# Patient Record
Sex: Female | Born: 1935 | Race: White | Hispanic: No | Smoking: Former smoker
Health system: Southern US, Community
[De-identification: ages and names within clinical notes are randomized; demographics above are authoritative.]

## PROBLEM LIST (undated history)

## (undated) DIAGNOSIS — R0602 Shortness of breath: Secondary | ICD-10-CM

## (undated) DIAGNOSIS — E039 Hypothyroidism, unspecified: Secondary | ICD-10-CM

## (undated) DIAGNOSIS — I10 Essential (primary) hypertension: Secondary | ICD-10-CM

## (undated) DIAGNOSIS — C801 Malignant (primary) neoplasm, unspecified: Secondary | ICD-10-CM

## (undated) DIAGNOSIS — M5416 Radiculopathy, lumbar region: Secondary | ICD-10-CM

## (undated) DIAGNOSIS — Z87440 Personal history of urinary (tract) infections: Secondary | ICD-10-CM

## (undated) DIAGNOSIS — J189 Pneumonia, unspecified organism: Secondary | ICD-10-CM

## (undated) DIAGNOSIS — K219 Gastro-esophageal reflux disease without esophagitis: Secondary | ICD-10-CM

## (undated) DIAGNOSIS — G729 Myopathy, unspecified: Secondary | ICD-10-CM

## (undated) DIAGNOSIS — J449 Chronic obstructive pulmonary disease, unspecified: Secondary | ICD-10-CM

## (undated) DIAGNOSIS — R011 Cardiac murmur, unspecified: Secondary | ICD-10-CM

## (undated) DIAGNOSIS — M1990 Unspecified osteoarthritis, unspecified site: Secondary | ICD-10-CM

## (undated) DIAGNOSIS — F419 Anxiety disorder, unspecified: Secondary | ICD-10-CM

## (undated) DIAGNOSIS — E785 Hyperlipidemia, unspecified: Secondary | ICD-10-CM

## (undated) HISTORY — DX: Hypothyroidism, unspecified: E03.9

## (undated) HISTORY — DX: Chronic obstructive pulmonary disease, unspecified: J44.9

## (undated) HISTORY — DX: Hyperlipidemia, unspecified: E78.5

## (undated) HISTORY — PX: DILATION AND CURETTAGE OF UTERUS: SHX78

## (undated) HISTORY — DX: Myopathy, unspecified: G72.9

## (undated) HISTORY — DX: Essential (primary) hypertension: I10

## (undated) HISTORY — DX: Radiculopathy, lumbar region: M54.16

## (undated) HISTORY — PX: GLAUCOMA SURGERY: SHX656

## (undated) HISTORY — DX: Unspecified osteoarthritis, unspecified site: M19.90

## (undated) HISTORY — PX: CATARACT EXTRACTION, BILATERAL: SHX1313

## (undated) HISTORY — PX: OTHER SURGICAL HISTORY: SHX169

---

## 1997-06-10 ENCOUNTER — Other Ambulatory Visit: Admission: RE | Admit: 1997-06-10 | Discharge: 1997-06-10 | Payer: Self-pay | Admitting: Gynecology

## 1997-06-24 ENCOUNTER — Other Ambulatory Visit: Admission: RE | Admit: 1997-06-24 | Discharge: 1997-06-24 | Payer: Self-pay | Admitting: Gynecology

## 1998-05-26 ENCOUNTER — Ambulatory Visit (HOSPITAL_COMMUNITY): Admission: RE | Admit: 1998-05-26 | Discharge: 1998-05-26 | Payer: Self-pay | Admitting: Internal Medicine

## 1998-05-26 ENCOUNTER — Encounter: Payer: Self-pay | Admitting: Internal Medicine

## 1998-05-28 ENCOUNTER — Ambulatory Visit: Admission: RE | Admit: 1998-05-28 | Discharge: 1998-05-28 | Payer: Self-pay | Admitting: Internal Medicine

## 1998-09-15 ENCOUNTER — Other Ambulatory Visit: Admission: RE | Admit: 1998-09-15 | Discharge: 1998-09-15 | Payer: Self-pay | Admitting: Gynecology

## 1999-04-20 ENCOUNTER — Encounter: Payer: Self-pay | Admitting: Rheumatology

## 1999-04-20 ENCOUNTER — Encounter: Admission: RE | Admit: 1999-04-20 | Discharge: 1999-04-20 | Payer: Self-pay | Admitting: Rheumatology

## 1999-05-18 ENCOUNTER — Encounter: Payer: Self-pay | Admitting: Rheumatology

## 1999-05-18 ENCOUNTER — Ambulatory Visit (HOSPITAL_COMMUNITY): Admission: RE | Admit: 1999-05-18 | Discharge: 1999-05-18 | Payer: Self-pay | Admitting: Rheumatology

## 1999-06-05 ENCOUNTER — Emergency Department (HOSPITAL_COMMUNITY): Admission: EM | Admit: 1999-06-05 | Discharge: 1999-06-05 | Payer: Self-pay | Admitting: Emergency Medicine

## 1999-06-06 ENCOUNTER — Encounter: Payer: Self-pay | Admitting: Emergency Medicine

## 1999-06-29 ENCOUNTER — Encounter: Admission: RE | Admit: 1999-06-29 | Discharge: 1999-06-29 | Payer: Self-pay | Admitting: Internal Medicine

## 1999-06-29 ENCOUNTER — Encounter: Payer: Self-pay | Admitting: Internal Medicine

## 1999-07-13 ENCOUNTER — Encounter: Admission: RE | Admit: 1999-07-13 | Discharge: 1999-08-03 | Payer: Self-pay | Admitting: Rheumatology

## 1999-11-02 ENCOUNTER — Other Ambulatory Visit: Admission: RE | Admit: 1999-11-02 | Discharge: 1999-11-02 | Payer: Self-pay | Admitting: Gynecology

## 2000-01-19 ENCOUNTER — Emergency Department (HOSPITAL_COMMUNITY): Admission: EM | Admit: 2000-01-19 | Discharge: 2000-01-19 | Payer: Self-pay | Admitting: Emergency Medicine

## 2000-01-19 ENCOUNTER — Encounter: Payer: Self-pay | Admitting: Emergency Medicine

## 2000-10-28 ENCOUNTER — Encounter: Admission: RE | Admit: 2000-10-28 | Discharge: 2000-10-28 | Payer: Self-pay | Admitting: Internal Medicine

## 2000-10-28 ENCOUNTER — Encounter: Payer: Self-pay | Admitting: Internal Medicine

## 2000-12-15 ENCOUNTER — Other Ambulatory Visit: Admission: RE | Admit: 2000-12-15 | Discharge: 2000-12-15 | Payer: Self-pay | Admitting: Gynecology

## 2001-04-26 ENCOUNTER — Encounter: Payer: Self-pay | Admitting: Emergency Medicine

## 2001-04-26 ENCOUNTER — Inpatient Hospital Stay (HOSPITAL_COMMUNITY): Admission: EM | Admit: 2001-04-26 | Discharge: 2001-04-27 | Payer: Self-pay | Admitting: Emergency Medicine

## 2002-11-19 ENCOUNTER — Encounter: Admission: RE | Admit: 2002-11-19 | Discharge: 2002-11-19 | Payer: Self-pay | Admitting: Internal Medicine

## 2002-11-19 ENCOUNTER — Encounter: Payer: Self-pay | Admitting: Internal Medicine

## 2003-01-15 ENCOUNTER — Ambulatory Visit: Admission: RE | Admit: 2003-01-15 | Discharge: 2003-01-15 | Payer: Self-pay | Admitting: Internal Medicine

## 2003-02-07 ENCOUNTER — Encounter: Admission: RE | Admit: 2003-02-07 | Discharge: 2003-02-07 | Payer: Self-pay | Admitting: Internal Medicine

## 2003-02-24 ENCOUNTER — Encounter: Admission: RE | Admit: 2003-02-24 | Discharge: 2003-02-24 | Payer: Self-pay | Admitting: Internal Medicine

## 2003-02-28 ENCOUNTER — Encounter: Admission: RE | Admit: 2003-02-28 | Discharge: 2003-02-28 | Payer: Self-pay | Admitting: Gynecology

## 2003-03-20 ENCOUNTER — Emergency Department (HOSPITAL_COMMUNITY): Admission: AD | Admit: 2003-03-20 | Discharge: 2003-03-20 | Payer: Self-pay | Admitting: Family Medicine

## 2003-03-27 ENCOUNTER — Encounter: Admission: RE | Admit: 2003-03-27 | Discharge: 2003-03-27 | Payer: Self-pay | Admitting: Internal Medicine

## 2003-06-27 ENCOUNTER — Encounter: Admission: RE | Admit: 2003-06-27 | Discharge: 2003-06-27 | Payer: Self-pay | Admitting: Internal Medicine

## 2003-07-03 ENCOUNTER — Other Ambulatory Visit: Admission: RE | Admit: 2003-07-03 | Discharge: 2003-07-03 | Payer: Self-pay | Admitting: Gynecology

## 2003-07-15 ENCOUNTER — Encounter: Admission: RE | Admit: 2003-07-15 | Discharge: 2003-07-15 | Payer: Self-pay | Admitting: Internal Medicine

## 2004-01-09 ENCOUNTER — Ambulatory Visit: Payer: Self-pay | Admitting: Internal Medicine

## 2004-02-07 ENCOUNTER — Ambulatory Visit: Payer: Self-pay | Admitting: Internal Medicine

## 2004-05-26 ENCOUNTER — Ambulatory Visit: Payer: Self-pay | Admitting: Internal Medicine

## 2004-06-09 ENCOUNTER — Encounter: Admission: RE | Admit: 2004-06-09 | Discharge: 2004-06-09 | Payer: Self-pay | Admitting: Internal Medicine

## 2004-06-22 ENCOUNTER — Ambulatory Visit: Payer: Self-pay | Admitting: Internal Medicine

## 2004-09-22 ENCOUNTER — Ambulatory Visit: Payer: Self-pay | Admitting: Internal Medicine

## 2004-10-27 ENCOUNTER — Encounter: Admission: RE | Admit: 2004-10-27 | Discharge: 2004-10-27 | Payer: Self-pay | Admitting: Internal Medicine

## 2004-11-30 ENCOUNTER — Ambulatory Visit: Payer: Self-pay | Admitting: Internal Medicine

## 2005-01-06 ENCOUNTER — Ambulatory Visit: Payer: Self-pay | Admitting: Internal Medicine

## 2005-04-09 ENCOUNTER — Ambulatory Visit: Payer: Self-pay | Admitting: Internal Medicine

## 2005-05-04 ENCOUNTER — Ambulatory Visit: Payer: Self-pay | Admitting: Gastroenterology

## 2005-05-10 ENCOUNTER — Ambulatory Visit: Payer: Self-pay | Admitting: Gastroenterology

## 2005-05-14 ENCOUNTER — Ambulatory Visit: Payer: Self-pay | Admitting: Gastroenterology

## 2005-05-14 ENCOUNTER — Encounter (INDEPENDENT_AMBULATORY_CARE_PROVIDER_SITE_OTHER): Payer: Self-pay | Admitting: Specialist

## 2005-08-02 ENCOUNTER — Encounter: Admission: RE | Admit: 2005-08-02 | Discharge: 2005-08-02 | Payer: Self-pay | Admitting: Internal Medicine

## 2005-11-11 ENCOUNTER — Encounter: Admission: RE | Admit: 2005-11-11 | Discharge: 2005-11-11 | Payer: Self-pay | Admitting: Internal Medicine

## 2005-11-15 ENCOUNTER — Other Ambulatory Visit: Admission: RE | Admit: 2005-11-15 | Discharge: 2005-11-15 | Payer: Self-pay | Admitting: Gynecology

## 2005-12-08 ENCOUNTER — Ambulatory Visit: Payer: Self-pay | Admitting: Internal Medicine

## 2005-12-13 ENCOUNTER — Ambulatory Visit: Payer: Self-pay | Admitting: Internal Medicine

## 2005-12-13 LAB — CONVERTED CEMR LAB
ALT: 20 units/L (ref 0–40)
AST: 31 units/L (ref 0–37)
BUN: 20 mg/dL (ref 6–23)
Chol/HDL Ratio, serum: 3.2
Cholesterol: 124 mg/dL (ref 0–200)
Creatinine, Ser: 1.2 mg/dL (ref 0.4–1.2)
HDL: 38.6 mg/dL — ABNORMAL LOW (ref >39.0)
LDL Cholesterol: 73 mg/dL (ref 0–99)
Potassium: 3.8 meq/L (ref 3.5–5.1)
Triglyceride fasting, serum: 63 mg/dL (ref 0–149)
VLDL: 13 mg/dL (ref 0–40)

## 2006-10-17 ENCOUNTER — Ambulatory Visit: Payer: Self-pay | Admitting: Internal Medicine

## 2006-10-24 LAB — CONVERTED CEMR LAB
ALT: 24 units/L (ref 0–35)
AST: 34 units/L (ref 0–37)
Cholesterol: 130 mg/dL (ref 0–200)
Glucose, Bld: 104 mg/dL — ABNORMAL HIGH (ref 70–99)
HDL: 37.7 mg/dL — ABNORMAL LOW (ref >39.0)
LDL Cholesterol: 77 mg/dL (ref 0–99)
Total CHOL/HDL Ratio: 3.4
Triglycerides: 76 mg/dL (ref 0–149)
VLDL: 15 mg/dL (ref 0–40)

## 2006-10-25 ENCOUNTER — Ambulatory Visit: Payer: Self-pay | Admitting: Internal Medicine

## 2006-10-25 DIAGNOSIS — IMO0002 Reserved for concepts with insufficient information to code with codable children: Secondary | ICD-10-CM

## 2006-10-25 DIAGNOSIS — R7989 Other specified abnormal findings of blood chemistry: Secondary | ICD-10-CM | POA: Insufficient documentation

## 2006-10-25 DIAGNOSIS — G729 Myopathy, unspecified: Secondary | ICD-10-CM

## 2006-10-25 DIAGNOSIS — E785 Hyperlipidemia, unspecified: Secondary | ICD-10-CM | POA: Insufficient documentation

## 2006-10-25 LAB — CONVERTED CEMR LAB
Cholesterol, target level: 200 mg/dL
HDL goal, serum: 40 mg/dL
Hgb A1c MFr Bld: 5.9 % (ref 4.6–6.0)
LDL Goal: 130 mg/dL

## 2006-10-26 ENCOUNTER — Encounter (INDEPENDENT_AMBULATORY_CARE_PROVIDER_SITE_OTHER): Payer: Self-pay | Admitting: *Deleted

## 2006-10-29 ENCOUNTER — Encounter: Admission: RE | Admit: 2006-10-29 | Discharge: 2006-10-29 | Payer: Self-pay | Admitting: Internal Medicine

## 2006-11-01 ENCOUNTER — Encounter (INDEPENDENT_AMBULATORY_CARE_PROVIDER_SITE_OTHER): Payer: Self-pay | Admitting: *Deleted

## 2007-02-14 ENCOUNTER — Encounter (INDEPENDENT_AMBULATORY_CARE_PROVIDER_SITE_OTHER): Payer: Self-pay | Admitting: *Deleted

## 2007-02-23 DIAGNOSIS — J189 Pneumonia, unspecified organism: Secondary | ICD-10-CM

## 2007-02-23 HISTORY — DX: Pneumonia, unspecified organism: J18.9

## 2007-02-27 ENCOUNTER — Inpatient Hospital Stay (HOSPITAL_COMMUNITY): Admission: EM | Admit: 2007-02-27 | Discharge: 2007-03-05 | Payer: Self-pay | Admitting: Emergency Medicine

## 2007-02-27 ENCOUNTER — Ambulatory Visit: Payer: Self-pay | Admitting: Internal Medicine

## 2007-03-06 ENCOUNTER — Telehealth: Payer: Self-pay | Admitting: Internal Medicine

## 2007-03-07 ENCOUNTER — Ambulatory Visit: Payer: Self-pay | Admitting: Internal Medicine

## 2007-03-07 DIAGNOSIS — J449 Chronic obstructive pulmonary disease, unspecified: Secondary | ICD-10-CM | POA: Insufficient documentation

## 2007-03-08 ENCOUNTER — Encounter: Payer: Self-pay | Admitting: Internal Medicine

## 2007-03-13 ENCOUNTER — Encounter: Payer: Self-pay | Admitting: Internal Medicine

## 2007-03-15 ENCOUNTER — Encounter: Payer: Self-pay | Admitting: Internal Medicine

## 2007-03-17 ENCOUNTER — Encounter: Payer: Self-pay | Admitting: Internal Medicine

## 2007-03-28 ENCOUNTER — Encounter: Payer: Self-pay | Admitting: Internal Medicine

## 2007-04-18 ENCOUNTER — Ambulatory Visit: Payer: Self-pay | Admitting: Internal Medicine

## 2007-07-04 ENCOUNTER — Ambulatory Visit: Payer: Self-pay | Admitting: Internal Medicine

## 2007-07-11 ENCOUNTER — Encounter: Payer: Self-pay | Admitting: Internal Medicine

## 2007-07-13 ENCOUNTER — Encounter (INDEPENDENT_AMBULATORY_CARE_PROVIDER_SITE_OTHER): Payer: Self-pay | Admitting: *Deleted

## 2007-07-13 LAB — CONVERTED CEMR LAB
ALT: 22 units/L (ref 0–35)
AST: 34 units/L (ref 0–37)
Albumin: 3.4 g/dL — ABNORMAL LOW (ref 3.5–5.2)
Alkaline Phosphatase: 93 units/L (ref 39–117)
Bilirubin, Direct: 0.1 mg/dL (ref 0.0–0.3)
Cholesterol: 164 mg/dL (ref 0–200)
HDL: 50.9 mg/dL (ref >39.0)
LDL Cholesterol: 101 mg/dL — ABNORMAL HIGH (ref 0–99)
Total Bilirubin: 0.5 mg/dL (ref 0.3–1.2)
Total CHOL/HDL Ratio: 3.2
Total Protein: 6.9 g/dL (ref 6.0–8.3)
Triglycerides: 59 mg/dL (ref 0–149)
VLDL: 12 mg/dL (ref 0–40)

## 2007-07-24 ENCOUNTER — Ambulatory Visit: Payer: Self-pay | Admitting: Internal Medicine

## 2007-08-02 ENCOUNTER — Encounter: Payer: Self-pay | Admitting: Internal Medicine

## 2007-09-19 ENCOUNTER — Ambulatory Visit: Payer: Self-pay | Admitting: Internal Medicine

## 2007-09-19 DIAGNOSIS — C495 Malignant neoplasm of connective and soft tissue of pelvis: Secondary | ICD-10-CM

## 2007-09-20 ENCOUNTER — Encounter (INDEPENDENT_AMBULATORY_CARE_PROVIDER_SITE_OTHER): Payer: Self-pay | Admitting: *Deleted

## 2007-09-26 ENCOUNTER — Telehealth: Payer: Self-pay | Admitting: Internal Medicine

## 2007-10-16 DIAGNOSIS — Z860100 Personal history of colon polyps, unspecified: Secondary | ICD-10-CM | POA: Insufficient documentation

## 2007-10-16 DIAGNOSIS — K644 Residual hemorrhoidal skin tags: Secondary | ICD-10-CM | POA: Insufficient documentation

## 2007-10-17 ENCOUNTER — Ambulatory Visit: Payer: Self-pay | Admitting: Gastroenterology

## 2007-10-17 DIAGNOSIS — D049 Carcinoma in situ of skin, unspecified: Secondary | ICD-10-CM | POA: Insufficient documentation

## 2007-10-17 DIAGNOSIS — K766 Portal hypertension: Secondary | ICD-10-CM

## 2007-12-07 ENCOUNTER — Ambulatory Visit: Payer: Self-pay | Admitting: Internal Medicine

## 2007-12-07 DIAGNOSIS — G479 Sleep disorder, unspecified: Secondary | ICD-10-CM

## 2007-12-07 DIAGNOSIS — R413 Other amnesia: Secondary | ICD-10-CM | POA: Insufficient documentation

## 2007-12-07 DIAGNOSIS — G589 Mononeuropathy, unspecified: Secondary | ICD-10-CM | POA: Insufficient documentation

## 2007-12-16 ENCOUNTER — Encounter: Payer: Self-pay | Admitting: Internal Medicine

## 2007-12-22 ENCOUNTER — Encounter: Payer: Self-pay | Admitting: Internal Medicine

## 2007-12-26 ENCOUNTER — Telehealth: Payer: Self-pay | Admitting: Internal Medicine

## 2007-12-28 ENCOUNTER — Encounter: Payer: Self-pay | Admitting: Internal Medicine

## 2007-12-31 ENCOUNTER — Encounter: Payer: Self-pay | Admitting: Internal Medicine

## 2008-03-15 ENCOUNTER — Ambulatory Visit: Payer: Self-pay | Admitting: Family Medicine

## 2008-03-18 ENCOUNTER — Telehealth (INDEPENDENT_AMBULATORY_CARE_PROVIDER_SITE_OTHER): Payer: Self-pay | Admitting: *Deleted

## 2008-04-09 ENCOUNTER — Ambulatory Visit: Payer: Self-pay | Admitting: Internal Medicine

## 2008-04-09 DIAGNOSIS — H9319 Tinnitus, unspecified ear: Secondary | ICD-10-CM | POA: Insufficient documentation

## 2008-04-11 ENCOUNTER — Encounter: Payer: Self-pay | Admitting: Internal Medicine

## 2008-11-21 ENCOUNTER — Encounter: Payer: Self-pay | Admitting: Internal Medicine

## 2009-01-02 ENCOUNTER — Ambulatory Visit: Payer: Self-pay | Admitting: Internal Medicine

## 2009-01-02 DIAGNOSIS — F411 Generalized anxiety disorder: Secondary | ICD-10-CM | POA: Insufficient documentation

## 2009-01-02 DIAGNOSIS — R9431 Abnormal electrocardiogram [ECG] [EKG]: Secondary | ICD-10-CM

## 2009-01-02 DIAGNOSIS — I10 Essential (primary) hypertension: Secondary | ICD-10-CM | POA: Insufficient documentation

## 2009-01-06 ENCOUNTER — Encounter (INDEPENDENT_AMBULATORY_CARE_PROVIDER_SITE_OTHER): Payer: Self-pay | Admitting: *Deleted

## 2009-01-09 ENCOUNTER — Encounter: Admission: RE | Admit: 2009-01-09 | Discharge: 2009-01-09 | Payer: Self-pay | Admitting: Internal Medicine

## 2009-03-19 ENCOUNTER — Ambulatory Visit: Payer: Self-pay | Admitting: Internal Medicine

## 2009-03-19 ENCOUNTER — Telehealth (INDEPENDENT_AMBULATORY_CARE_PROVIDER_SITE_OTHER): Payer: Self-pay | Admitting: *Deleted

## 2009-03-19 DIAGNOSIS — R4586 Emotional lability: Secondary | ICD-10-CM | POA: Insufficient documentation

## 2009-04-04 ENCOUNTER — Telehealth (INDEPENDENT_AMBULATORY_CARE_PROVIDER_SITE_OTHER): Payer: Self-pay | Admitting: *Deleted

## 2009-05-22 ENCOUNTER — Encounter: Payer: Self-pay | Admitting: Internal Medicine

## 2009-07-09 ENCOUNTER — Telehealth (INDEPENDENT_AMBULATORY_CARE_PROVIDER_SITE_OTHER): Payer: Self-pay | Admitting: *Deleted

## 2009-10-17 ENCOUNTER — Ambulatory Visit: Payer: Self-pay | Admitting: Internal Medicine

## 2009-10-17 DIAGNOSIS — R3915 Urgency of urination: Secondary | ICD-10-CM | POA: Insufficient documentation

## 2009-10-17 LAB — CONVERTED CEMR LAB
Bilirubin Urine: NEGATIVE
Blood in Urine, dipstick: NEGATIVE
Ketones, urine, test strip: NEGATIVE
Specific Gravity, Urine: 1.01

## 2009-11-04 ENCOUNTER — Encounter: Payer: Self-pay | Admitting: Internal Medicine

## 2009-11-17 ENCOUNTER — Telehealth (INDEPENDENT_AMBULATORY_CARE_PROVIDER_SITE_OTHER): Payer: Self-pay | Admitting: *Deleted

## 2009-11-18 ENCOUNTER — Ambulatory Visit: Payer: Self-pay | Admitting: Internal Medicine

## 2009-11-19 LAB — CONVERTED CEMR LAB
Specific Gravity, Urine: 1.025 (ref 1.000–1.030)
Urine Glucose: NEGATIVE mg/dL

## 2009-11-20 ENCOUNTER — Telehealth (INDEPENDENT_AMBULATORY_CARE_PROVIDER_SITE_OTHER): Payer: Self-pay | Admitting: *Deleted

## 2010-03-14 ENCOUNTER — Encounter: Payer: Self-pay | Admitting: Internal Medicine

## 2010-03-22 LAB — CONVERTED CEMR LAB
ALT: 15 units/L (ref 0–35)
AST: 23 units/L (ref 0–37)
Albumin: 4 g/dL (ref 3.5–5.2)
Alkaline Phosphatase: 70 units/L (ref 39–117)
BUN: 17 mg/dL (ref 6–23)
Basophils Absolute: 0 10*3/uL (ref 0.0–0.1)
Basophils Relative: 0.5 % (ref 0.0–3.0)
Bilirubin, Direct: 0.2 mg/dL (ref 0.0–0.3)
CO2: 32 meq/L (ref 19–32)
Calcium: 8.9 mg/dL (ref 8.4–10.5)
Chloride: 101 meq/L (ref 96–112)
Cholesterol: 168 mg/dL (ref 0–200)
Creatinine, Ser: 1.2 mg/dL (ref 0.4–1.2)
Eosinophils Absolute: 0.1 10*3/uL (ref 0.0–0.7)
Eosinophils Relative: 1.3 % (ref 0.0–5.0)
GFR calc non Af Amer: 46.47 mL/min (ref >60)
Glucose, Bld: 94 mg/dL (ref 70–99)
HCT: 36.8 % (ref 36.0–46.0)
HDL: 44.7 mg/dL (ref >39.00)
Hemoglobin: 12.9 g/dL (ref 12.0–15.0)
LDL Cholesterol: 104 mg/dL — ABNORMAL HIGH (ref 0–99)
Lymphocytes Relative: 24 % (ref 12.0–46.0)
Lymphs Abs: 1.4 10*3/uL (ref 0.7–4.0)
MCHC: 34.9 g/dL (ref 30.0–36.0)
MCV: 96.1 fL (ref 78.0–100.0)
Monocytes Absolute: 0.4 10*3/uL (ref 0.1–1.0)
Monocytes Relative: 7 % (ref 3.0–12.0)
Neutro Abs: 3.8 10*3/uL (ref 1.4–7.7)
Neutrophils Relative %: 67.2 % (ref 43.0–77.0)
Platelets: 164 10*3/uL (ref 150.0–400.0)
Potassium: 3.5 meq/L (ref 3.5–5.1)
RBC: 3.83 M/uL — ABNORMAL LOW (ref 3.87–5.11)
RDW: 13.2 % (ref 11.5–14.6)
Sodium: 142 meq/L (ref 135–145)
TSH: 3.82 microintl units/mL (ref 0.35–5.50)
Total Bilirubin: 0.9 mg/dL (ref 0.3–1.2)
Total CHOL/HDL Ratio: 4
Total Protein: 7.3 g/dL (ref 6.0–8.3)
Triglycerides: 97 mg/dL (ref 0.0–149.0)
VLDL: 19.4 mg/dL (ref 0.0–40.0)
WBC: 5.7 10*3/uL (ref 4.5–10.5)

## 2010-03-26 NOTE — Assessment & Plan Note (Signed)
Summary: urgency to urinate.cbs   Vital Signs:  Patient profile:   77 year old female Weight:      162 pounds BMI:     29.50 Temp:     98.2 degrees F oral Pulse rate:   56 / minute Resp:     18 per minute BP sitting:   112 / 66  (left arm) Cuff size:   large  Vitals Entered By: Shonna Chock CMA (October 17, 2009 2:06 PM) CC: Urgency to urinate since 10/08/2009, Dysuria   Primary Care Ruthel Martine:  Elmore Guise, MD  CC:  Urgency to urinate since 10/08/2009 and Dysuria.  History of Present Illness: Urgency:      This is a 77 year old woman who presents with  urgency & some  burning with urination &  urinary  urgency since 08/17. She  denies hematuria, vaginal discharge, and vaginal itching.  The patient denies the following associated symptoms: nausea, vomiting, fever, shaking chills, flank pain, abdominal pain, back pain, and pelvic pain.  Rx: Azo  Allergies: 1)  ! Demerol 2)  ! * Utltram 3)  ! Lescol 4)  ! * Diclofenac 5)  ! Tylenol  Review of Systems General:  Denies chills, fever, and weakness.  Physical Exam  General:  in no acute distress; alert,appropriate and cooperative throughout examination Heart:  bradycardia.  Distant heart sounds Abdomen:  Bowel sounds positive,abdomen soft and non-tender without masses, organomegaly or hernias noted. Msk:  No flank pain Skin:  Intact without suspicious lesions or rashes Cervical Nodes:  No lymphadenopathy noted   Impression & Recommendations:  Problem # 1:  URINARY URGENCY (EAV-409.81)  Orders: Prescription Created Electronically [phone])  Problem # 2:  DYSURIA (ICD-788.1)  The following medications were removed from the medication list:    Doxycycline Hyclate 100 Mg Caps (Doxycycline hyclate) .[name]... 1 two times a day x 5 days , then 1 once daily Her updated medication list for this problem includes:    Nitrofurantoin Monohyd Macro 100 Mg Caps (Nitrofurantoin monohyd macro) .[name]... 1 two times a day with 8 oz  water  Orders: UA Dipstick w/o Micro (manual) (82956) Specimen Handling (99000) T-Culture, Urine ([id number]) Prescription Created Electronically [id number])  Complete Medication List: 1)  Neurontin 100 Mg Caps (Gabapentin) .[name].. 1 q 8hrs as needed leg pain 2)  Chlorthalidone 25 Mg Tabs (Chlorthalidone) .... 1/2 by mouth once daily 3)  Combivent 103-18 Mcg/act Aero (Albuterol-ipratropium) .[name].. 1-2 puffs q 4-6 hrs prn 4)  Aspirin 81 Mg Tbec (Aspirin) .... Take 1 tablet by mouth once a day 5)  Advair Diskus 100-50 Mcg/dose Misc (Fluticasone-salmeterol) .[name].. 1 puff q12 hours 6)  Metoprolol Succinate 25 Mg Tb24 (metoprolol Succinate)  .[name].. 1 once daily 7)  Mucinex Prn  8)  Pravastatin Sodium 40 Mg Tabs (Pravastatin sodium) .... Take 1 tab by mouth at bedtime (patient is slack about taking at times) 9)  Ipratropium-albuterol 0.5-2.5 (3) Mg/45ml Soln (Ipratropium-albuterol) .[name].. 1 vial by nebulizer 4-6 x daily as needed 10)  Ventolin Hfa 108 (90 Base) Mcg/act Aers (Albuterol sulfate) .... 2 puffs every 4 hours as needed for wheezing or shortness of breath 11)  Paroxetine Hcl 20 Mg Tabs (Paroxetine hcl) .[name].. 1 once daily 12)  Nitrofurantoin Monohyd Macro 100 Mg Caps (Nitrofurantoin monohyd macro) .[name].. 1 two times a day with 8 oz water 13)  Phenazo 200 Mg Tabs (Phenazopyridine hcl) .[name].. 1 three times a day as needed urgency  Patient Instructions: 1)  Drink as much fluid as you  can tolerate for the next few days. Prescriptions: PHENAZO 200 MG TABS (PHENAZOPYRIDINE HCL) 1 three times a day as needed urgency  #21 x 0   Entered and Authorized by:   Marga Melnick MD   Signed by:   Marga Melnick MD on 10/17/2009   Method used:   [facility] ...       [facility] [phone]* (retail)       [address]       [place], [place]  [address]       Ph: [phone] or [phone]       Fax: [phone]   RxID:   [id number] NITROFURANTOIN MONOHYD MACRO 100 MG CAPS  (NITROFURANTOIN MONOHYD MACRO) 1 two times a day with 8 oz water  #14 x 0   Entered and Authorized by:   Marga Melnick MD   Signed by:   Marga Melnick MD on 10/17/2009   Method used:   [facility]ed to ...       [facility] [address] [phone]* (retail)       [address]       [place], [place]  [address]       Ph: [phone] or [phone]       Fax: [phone]   RxID:   [id number]   Laboratory Results   Urine Tests    Routine Urinalysis   Color: orange Appearance: Clear Glucose: negative   (Normal Range: Negative) Bilirubin: negative   (Normal Range: Negative) Ketone: negative   (Normal Range: Negative) Spec. Gravity: 1.010   (Normal Range: 1.003-1.035) Blood: negative   (Normal Range: Negative) pH: 6.0   (Normal Range: 5.0-8.0)    Comments: Unable to determine all values due to color of urine, patient took AZO

## 2010-03-26 NOTE — Letter (Signed)
Summary: Beather Arbour MD  Beather Arbour MD   Imported By: Lanelle Bal 11/20/2009 09:48:07  _____________________________________________________________________  External Attachment:    Type:   Image     Comment:   External Document

## 2010-03-26 NOTE — Progress Notes (Signed)
Summary: urine sx returns  Phone Note Call from Patient Call back at Home Phone [phone]   Caller: Patient Summary of Call: Spoke w/ patient says she thinks her urinary symptoms are returning not having any dysuria but is having frequency. Patient would like to have something called into pharmacy informed patient we could have her come by and leave urine sample but would check w/ Hop first.  Hop pls advise. Initial call taken by: Doristine Devoid CMA,  November 17, 2009 3:42 PM  Follow-up for Phone Call        she needs CCU for UA & C&S Follow-up by: Marga Melnick MD,  November 17, 2009 4:53 PM  Additional Follow-up for Phone Call Additional follow up Details #1::        spoke w/ patient will go to [facility] to have test done .Marland KitchenMarland KitchenDoristine Devoid CMA  November 17, 2009 4:58 PM

## 2010-03-26 NOTE — Progress Notes (Signed)
Summary: Rs. Question clearification   Phone Note Refill Request Message from:  Fax from Pharmacy on March 19, 2009 1:57 PM  Refills Requested: Medication #1:  ALBUTEROL SULFATE (5 MG/ML) 0.5% NEBU use as needed   Dosage confirmed as above?Dosage Confirmed   [facility]ed[facility] [address] wants to know if you want the pt. to have pre mixed solution?  Initial call taken by: Michaelle Copas,  March 19, 2009 1:59 PM  Follow-up for Phone Call        Called they pharmacy to discuss and was placed on hold for a while. I will try again later./[facility]  March 19, 2009 2:06 PM     Additional Follow-up for Phone Call Additional follow up Details #1::        I called the pharmacy and he was confused as to why we recieved the fax because the albuterol in that strength comes only 1 way. I advised for him to dispense the medicine the way it comes. Additional Follow-up by: Shonna Chock,  March 20, 2009 8:32 AM

## 2010-03-26 NOTE — Progress Notes (Signed)
Summary: Culture Results   Phone Note Outgoing Call Call back at Home Phone [phone]   Call placed by: Shonna Chock CMA,  November 20, 2009 8:15 AM Call placed to: Patient Details for Reason: Culture Results Summary of Call: Left message on machine for patient to return call when avaliable, Reason for call:  Now over 100,000 colonies of E. coli which is significant. Please complete full course of Ciprofloxacin 500 mg two times a day .  Shonna Chock CMA  November 20, 2009 8:15 AM   Follow-up for Phone Call        Spoke with patient and she ok'd all information and aware rx sent to the pharmacy Follow-up by: Shonna Chock CMA,  November 20, 2009 1:53 PM

## 2010-03-26 NOTE — Assessment & Plan Note (Signed)
Summary: congested, cant clear her throat- jr   Vital Signs:  Patient profile:   77 year old female Weight:      169 pounds O2 Sat:      94 % Temp:     97.6 degrees F oral Pulse rate:   60 / minute Resp:     18 per minute BP sitting:   124 / 70  (left arm) Cuff size:   large  Vitals Entered By: Shonna Chock (March 19, 2009 11:25 AM) CC: 1.) Discuss Paxil   2.)Congestion and unable to clear throat x 3 days, URI symptoms Comments REVIEWED MED LIST, PATIENT AGREED DOSE AND INSTRUCTION CORRECT    Primary Care Provider:  Elmore Guise, MD  CC:  1.) Discuss Paxil   2.)Congestion and unable to clear throat x 3 days and URI symptoms.  History of Present Illness:  URI Symptoms      This is a 77 year old woman who presents with URI symptoms X 3 days.  The patient reports nasal congestion and sick contacts, but denies purulent nasal discharge, sore throat, productive cough, and earache.  Associated symptoms include dyspnea which is essentially chronic & unchanged.  The patient denies fever, stiff neck, wheezing, rash, vomiting, and diarrhea.  The patient also reports itchy watery eyes and muscle aches.  The patient denies sneezing, headache, and severe fatigue.  The patient denies the following risk factors for Strep sinusitis: unilateral facial pain, unilateral nasal discharge, tooth pain, and tender adenopathy.  Rx: Mucinex ? Plain, Afrin spray  Allergies: 1)  ! Demerol 2)  ! * Utltram 3)  ! Lescol 4)  ! * Diclofenac 5)  ! Tylenol  Review of Systems Psych:  Complains of easily angered and irritability; denies anxiety, depression, and easily tearful; resumption of Paxil requested.  Physical Exam  General:  in no acute distress; alert,appropriate and cooperative throughout examination Ears:  External ear exam shows no significant lesions or deformities.  Otoscopic examination reveals clear canals, tympanic membranes are intact bilaterally without bulging, retraction, inflammation or  discharge. Hearing is grossly normal bilaterally. TMs scarred Nose:  External nasal examination shows no deformity or inflammation. Nasal mucosa are dry without lesions or exudates. Mouth:  Oral mucosa and oropharynx without lesions or exudates.  Teeth in good repair. Hoarse Lungs:   Lungs are clear to auscultation, no crackles or wheezes but increased AP diameter & decreased BS. Extremities:  No clubbing, cyanosis, edema Skin:  Intact without suspicious lesions or rashes Cervical Nodes:  No lymphadenopathy noted Axillary Nodes:  No palpable lymphadenopathy Psych:  Oriented X3, memory intact for recent and remote, normally interactive, good eye contact, not anxious appearing, and not depressed appearing.   Clinically not depressed    Impression & Recommendations:  Problem # 1:  URI (ICD-465.9)  Her updated medication list for this problem includes:    Aspirin 81 Mg Tbec (Aspirin) .[name]... Take 1 tablet by mouth once a day  Orders: Prescription Created Electronically [id number] [id number])  Problem # 2:  OBSTRUCTIVE CHRONIC BRONCHITIS WITHOUT EXACERBAT (ICD-491.20) Albuteral ampules for Neb  renewed Orders: Prescription Created Electronically [phone])  Problem # 3:  MOOD SWINGS (ICD-296.99) Paroxetine renewed  Complete Medication List: 1)  Neurontin 100 Mg Caps (Gabapentin) .[name].. 1 q 8hrs as needed leg pain 2)  Chlorthalidone 25 Mg Tabs (Chlorthalidone) .... 1/2 by mouth once daily 3)  Combivent 103-18 Mcg/act Aero (Albuterol-ipratropium) .[name].. 1-2 puffs q 4-6 hrs prn 4)  Aspirin 81 Mg Tbec (Aspirin) .... Take 1  tablet by mouth once a day 5)  Advair Diskus 100-50 Mcg/dose Misc (Fluticasone-salmeterol) .[name].. 1 puff q12 hours 6)  Metoprolol Succinate 25 Mg Tb24 (metoprolol Succinate)  .[name].. 1 once daily 7)  Mucinex Prn  8)  Pravastatin Sodium 40 Mg Tabs (Pravastatin sodium) .... Take 1 tab by mouth at bedtime 9)  Albuterol Sulfate (5 Mg/ml) 0.5% Nebu (Albuterol sulfate) .... Use as needed 10)  Ventolin  Hfa 108 (90 Base) Mcg/act Aers (Albuterol sulfate) .... 2 puffs every 4 hours as needed for wheezing or shortness of breath 11)  Doxycycline Hyclate 100 Mg Caps (Doxycycline hyclate) .[name].. 1 two times a day x 5 days , then 1 once daily 12)  Paroxetine Hcl 20 Mg Tabs (Paroxetine hcl) .[name].. 1 once daily  Patient Instructions: 1)  Saline rinse once daily until head clear. Plain Mucinex for thick secretions. 2)  Drink as much fluid as you can tolerate for the next few days. Prescriptions: PAROXETINE HCL 20 MG TABS (PAROXETINE HCL) 1 once daily  #30 x 5   Entered and Authorized by:   Marga Melnick MD   Signed by:   Marga Melnick MD on 03/19/2009   Method used:   [facility] to ...       [facility]  [address] [phone]* (retail)       [address]       [place], [place]  [address]       Ph: [phone] or [phone]       Fax: [phone]   RxID:   [id number] ALBUTEROL SULFATE (5 MG/ML) 0.5% NEBU (ALBUTEROL SULFATE) use as needed  #90 x 1   Entered and Authorized by:   Marga Melnick MD   Signed by:   Marga Melnick MD on 03/19/2009   Method used:   [facility]ed to ...       [facility]  [address] [phone]* (retail)       [address]       [place], [place]  [address]       Ph: [phone] or [phone]       Fax: [phone]   RxID:   [id number] DOXYCYCLINE HYCLATE 100 MG CAPS (DOXYCYCLINE HYCLATE) 1 two times a day X 5 days , then 1 once daily  #15 x 0   Entered and Authorized by:   Marga Melnick MD   Signed by:   Marga Melnick MD on 03/19/2009   Method used:   [facility]ed to ...       [facility]  [address] [phone]* (retail)       [address]       [place], [place]  [address]       Ph: [phone] or [phone]       Fax: [phone]   RxID:   [id number]

## 2010-03-26 NOTE — Letter (Signed)
Summary: [facility]   Imported By: Lanelle Bal 06/04/2009 14:25:21  _____________________________________________________________________  External Attachment:    Type:   Image     Comment:   External Document

## 2010-03-26 NOTE — Progress Notes (Signed)
Summary: refill   Phone Note Refill Request Message from:  Patient  Refills Requested: Medication #1:  NEURONTIN 100 MG  CAPS 1 q 8hrs as needed leg pain patient [facility] [address]  ---- patient said she doesnt tke it that often   Method Requested: Fax to Local Pharmacy Initial call taken by: Okey Regal [place],  Scheerer 18, 2011 5:02 PM    Prescriptions: NEURONTIN 100 MG  CAPS (GABAPENTIN) 1 q 8hrs as needed leg pain  #30 x 0   Entered by:   Shonna Chock   Authorized by:   Marga Melnick MD   Signed by:   Shonna Chock on 07/10/2009   Method used:   [facility]        [facility] [phone][facility] (retail)       [address]       [place], [place]  [address]       Ph: [phone] or [phone]       Fax: [phone]   RxID:   [id number]

## 2010-03-26 NOTE — Progress Notes (Signed)
Summary: Rx Refill  Phone Note Call from Patient Call back at Home Phone [phone]   Refills Requested: Medication #1:  ALBUTEROL SULFATE (5 MG/ML) 0.5% NEBU use as needed Caller: Other Relative Call For: Marga Melnick MD Summary of Call: The wrong medication was called to the pharmacy.  Please call to get the correct medication called in. Initial call taken by: Barnie Mort,  April 04, 2009 2:26 PM  Follow-up for Phone Call        Iprapropium-Bromide .5mg /Albuterol sulfate 3.0mg  Follow-up by: Shonna Chock,  April 04, 2009 2:35 PM    New/Updated Medications: IPRATROPIUM-ALBUTEROL 0.5-2.5 (3) MG/3ML SOLN (IPRATROPIUM-ALBUTEROL) 1 vial by nebulizer 4-6 x daily as needed Prescriptions: IPRATROPIUM-ALBUTEROL 0.5-2.5 (3) MG/3ML SOLN (IPRATROPIUM-ALBUTEROL) 1 vial by nebulizer 4-6 x daily as needed  #27mo supply x 5   Entered by:   Shonna Chock   Authorized by:   Marga Melnick MD   Signed by:   Shonna Chock on 04/04/2009   Method used:   [facility] to        [facility] [phone]* (retail)       [address]       [place], [place]  [address]       Ph: [phone] or [phone]       Fax: [phone]   RxID:   [id number]

## 2010-04-14 ENCOUNTER — Ambulatory Visit: Payer: Self-pay | Admitting: Internal Medicine

## 2010-04-15 ENCOUNTER — Ambulatory Visit (INDEPENDENT_AMBULATORY_CARE_PROVIDER_SITE_OTHER): Payer: PRIVATE HEALTH INSURANCE | Admitting: Internal Medicine

## 2010-04-15 ENCOUNTER — Encounter: Payer: Self-pay | Admitting: Internal Medicine

## 2010-04-15 DIAGNOSIS — R3915 Urgency of urination: Secondary | ICD-10-CM

## 2010-04-15 DIAGNOSIS — N390 Urinary tract infection, site not specified: Secondary | ICD-10-CM | POA: Insufficient documentation

## 2010-04-15 LAB — CONVERTED CEMR LAB
Glucose, Urine, Semiquant: NEGATIVE
Specific Gravity, Urine: 1.015
pH: 6

## 2010-04-20 ENCOUNTER — Telehealth (INDEPENDENT_AMBULATORY_CARE_PROVIDER_SITE_OTHER): Payer: Self-pay | Admitting: *Deleted

## 2010-04-21 NOTE — Assessment & Plan Note (Signed)
Summary: Reoccuring UTI/ABX request/kb   Vital Signs:  Patient profile:   77 year old female Weight:      164 pounds BMI:     29.86 Temp:     98.2 degrees F oral Pulse rate:   56 / minute Resp:     16 per minute BP sitting:   122 / 80  (left arm) Cuff size:   large  Vitals Entered By: Shonna Chock CMA (April 15, 2010 3:55 PM) CC: UTI , Dysuria   Primary Care Provider:  Elmore Guise, MD  CC:  UTI  and Dysuria.  History of Present Illness:    Onset 04/10/2010 as urgency with oliguria. he She reports burning with urination, but denies hematuria and vaginal discharge.  Associated symptoms include back pain.  The patient denies the following associated symptoms: nausea, vomiting, fever, shaking chills, flank pain, and pelvic pain.  History is significant for > 3 UTIs in one year.    Current Medications (verified): 1)  Neurontin 100 Mg  Caps (Gabapentin) .[name].. 1 Q 8hrs As Needed Leg Pain 2)  Chlorthalidone 25 Mg  Tabs (Chlorthalidone) .... 1/2 By Mouth Once Daily 3)  Combivent 103-18 Mcg/act  Aero (Albuterol-Ipratropium) .[name].. 1-2 Puffs Q 4-6 Hrs Prn 4)  Aspirin 81 Mg Tbec (Aspirin) .... Take 1 Tablet By Mouth Once A Day 5)  Advair Diskus 100-50 Mcg/dose  Misc (Fluticasone-Salmeterol) .[name].. 1 Puff Q12 Hours 6)  Metoprolol Succinate 25 Mg Xr24h-Tab (Metoprolol Succinate) .... Take One Tablet Daily 7)  Mucinex Prn 8)  Pravastatin Sodium 40 Mg  Tabs (Pravastatin Sodium) .... Take 1 Tab By Mouth At Bedtime (Patient Is Slack About Taking At Times) 9)  Ipratropium-Albuterol 0.5-2.5 (3) Mg/27ml Soln (Ipratropium-Albuterol) .[name].. 1 Vial By Nebulizer 4-6 X Daily As Needed 10)  Ventolin Hfa 108 (90 Base) Mcg/act Aers (Albuterol Sulfate) .... 2 Puffs Every 4 Hours As Needed For Wheezing or Shortness of Breath 11)  Paroxetine Hcl 20 Mg Tabs (Paroxetine Hcl) .[name].. 1 Once Daily  Allergies: 1)  ! Demerol 2)  ! * Utltram 3)  ! Lescol 4)  ! * Diclofenac 5)  ! Tylenol  Physical Exam  General:   in no acute distress; alert,appropriate and cooperative throughout examination Heart:  regular rhythm and bradycardia.   S4 Abdomen:  Bowel sounds positive,abdomen soft and non-tender without masses, organomegaly or hernias noted. Msk:  No flank tenderness; neg SLR Skin:  Intact without suspicious lesions or rashes Cervical Nodes:  No lymphadenopathy noted Axillary Nodes:  No palpable lymphadenopathy   Impression & Recommendations:  Problem # 1:  UTI (ICD-599.0)  The following medications were removed from the medication list:    Nitrofurantoin Monohyd Macro 100 Mg Caps (Nitrofurantoin monohyd macro) .[name]... 1 two times a day with 8 oz water    Ciprofloxacin Hcl 500 Mg Tabs (Ciprofloxacin hcl) .[name]... 1 two times a day Her updated medication list for this problem includes:    Nitrofurantoin Monohyd Macro 100 Mg Caps (Nitrofurantoin monohyd macro) .[name]... 1 two times a day with 8 oz water  Orders: UA Dipstick w/o Micro (manual) (16109) Specimen Handling (99000) T-Culture, Urine ([id number]) Prescription Created Electronically [id number])  Complete Medication List: 1)  Neurontin 100 Mg Caps (Gabapentin) .[name].. 1 q 8hrs as needed leg pain 2)  Chlorthalidone 25 Mg Tabs (Chlorthalidone) .... 1/2 by mouth once daily 3)  Combivent 103-18 Mcg/act Aero (Albuterol-ipratropium) .[name].. 1-2 puffs q 4-6 hrs prn 4)  Aspirin 81 Mg Tbec (Aspirin) .... Take 1 tablet by mouth  once a day 5)  Advair Diskus 100-50 Mcg/dose Misc (Fluticasone-salmeterol) .[name].. 1 puff q12 hours 6)  Metoprolol Succinate 25 Mg Xr24h-tab (Metoprolol succinate) .... Take one tablet daily 7)  Mucinex Prn  8)  Pravastatin Sodium 40 Mg Tabs (Pravastatin sodium) .... Take 1 tab by mouth at bedtime (patient is slack about taking at times) 9)  Ipratropium-albuterol 0.5-2.5 (3) Mg/24ml Soln (Ipratropium-albuterol) .[name].. 1 vial by nebulizer 4-6 x daily as needed 10)  Ventolin Hfa 108 (90 Base) Mcg/act Aers (Albuterol sulfate) .... 2 puffs every 4  hours as needed for wheezing or shortness of breath 11)  Paroxetine Hcl 20 Mg Tabs (Paroxetine hcl) .[name].. 1 once daily 12)  Nitrofurantoin Monohyd Macro 100 Mg Caps (Nitrofurantoin monohyd macro) .[name].. 1 two times a day with 8 oz water  Patient Instructions: 1)  Drink as much fluid as you can tolerate for the next few days. Prescriptions: NITROFURANTOIN MONOHYD MACRO 100 MG CAPS (NITROFURANTOIN MONOHYD MACRO) 1 two times a day with 8 oz water  #20 x 0   Entered and Authorized by:   Marga Melnick MD   Signed by:   Marga Melnick MD on 04/15/2010   Method used:   [facility] to        [facility]  [address] [phone]* (retail)       [address]       [place], [place]  [address]       Ph: [phone] or [phone]       Fax: [phone]   RxID:   [id number]    Orders Added: 1)  UA Dipstick w/o Micro (manual) [81002] 2)  Specimen Handling [99000] 3)  T-Culture, Urine [[id number]] 4)  New Patient Level III [99203] 5)  Prescription Created Electronically [phone]    Laboratory Results   Urine Tests    Routine Urinalysis   Color: orange Appearance: Cloudy Glucose: negative   (Normal Range: Negative) Bilirubin: negative   (Normal Range: Negative) Spec. Gravity: 1.015   (Normal Range: 1.003-1.035) Blood: small   (Normal Range: Negative) pH: 6.0   (Normal Range: 5.0-8.0) Urobilinogen: 0.2   (Normal Range: 0-1) Leukocyte Esterace: large   (Normal Range: Negative)    Comments: Some values blank due to urine color (Orange) due to patient taking AZO. Sent for culture

## 2010-04-30 NOTE — Progress Notes (Signed)
Summary: Culture Results  Phone Note Outgoing Call Call back at Home Phone [phone]   Call placed by: Shonna Chock CMA,  April 20, 2010 10:43 AM Call placed to: Patient Summary of Call: Left message on machine re-informing patient of Dr.Hopper instructions:  [facility] ; message left on home phone. If UTIs are recurrent , Urology evaluation indicated. Even though this is new bacterial isolate , there is no evidence of significant multi antibiotic  resistance. Nitrofurantoin (Macrobid ) which is  recommended  by the Standard of Care as initial  antibiotic until culture results return will not be able to used in future . Hopp  Patient to call if any questions or concerns Shonna Chock CMA  April 20, 2010 10:43 AM

## 2010-05-19 ENCOUNTER — Telehealth: Payer: Self-pay | Admitting: *Deleted

## 2010-05-19 DIAGNOSIS — N390 Urinary tract infection, site not specified: Secondary | ICD-10-CM

## 2010-05-19 NOTE — Telephone Encounter (Signed)
Pt aware referral placed 

## 2010-06-04 ENCOUNTER — Other Ambulatory Visit: Payer: Self-pay | Admitting: Internal Medicine

## 2010-06-10 ENCOUNTER — Other Ambulatory Visit: Payer: Self-pay | Admitting: Internal Medicine

## 2010-06-12 ENCOUNTER — Encounter: Payer: Self-pay | Admitting: Internal Medicine

## 2010-06-12 ENCOUNTER — Ambulatory Visit (INDEPENDENT_AMBULATORY_CARE_PROVIDER_SITE_OTHER): Payer: Medicare Other | Admitting: Internal Medicine

## 2010-06-12 DIAGNOSIS — J4489 Other specified chronic obstructive pulmonary disease: Secondary | ICD-10-CM

## 2010-06-12 DIAGNOSIS — J449 Chronic obstructive pulmonary disease, unspecified: Secondary | ICD-10-CM

## 2010-06-12 DIAGNOSIS — J209 Acute bronchitis, unspecified: Secondary | ICD-10-CM

## 2010-06-12 MED ORDER — AMOXICILLIN 500 MG PO CAPS: 500.0000 mg | ORAL_CAPSULE | Freq: Three times a day (TID) | ORAL | Status: AC

## 2010-06-12 NOTE — Progress Notes (Signed)
  Subjective:    Patient ID: Olivia [name], female    DOB: 1931-07-31, 77 y.o.   MRN: [id number]  HPI  she presents with  Bronchitic symptoms with purulent sputum.  Symptoms initially began one week ago with a sore throat.  She's had some increasing shortness of breath recently and had been using her Combivent more frequently. She's also used her nebulizer at least once a day.  She denies definite fever chills or sweats. She has felt somewhat chilly.  She denies signs of rhinosinusitis such as frontal headache, facial pain, dental pain, or enlarged lymph nodes. She's having no significant purulence and head.  She denies wheezing but has had increasing shortness of breath as noted.      Review of Systems     Objective:   Physical Exam she is in no acute distress; there is no increased work of breathing.  The nares are dry without exudate. Oropharynx reveals no significant erythema. The otic canals and tympanic membranes are normal  She has no lymphadenopathy about the head, neck or axilla.  There are minimal rhonchi noted without wheezing. O2 sats are 94 %  She has a slow S4. Heart sounds are distant.  She has no significant cyanosis, clubbing, or edema.        Assessment & Plan:  #1 bronchitis with purulent sputum  Plan: #1 broad-spectrum antibiotics.  A short burst of prednisone will be recommended if the symptoms progress.

## 2010-06-12 NOTE — Patient Instructions (Signed)
Drink as much nondairy fluids  you can over the next few days. If the secretions are thick, plain Mucinex or Mucinex DM would be appropriate. Fill the prescription for the prednisone only if you're having a lot of wheezing and increasing shortness of breath.

## 2010-07-07 NOTE — H&P (Signed)
NAMEMarland [name]  AJWA, Olivia NO.:  [id number]   MEDICAL RECORD NO.:  [id number]          PATIENT TYPE:  EMS   LOCATION:  ED                           FACILITY:  [facility]   PHYSICIAN:  Valerie A. Felicity Coyer, MDDATE OF BIRTH:  [birth year]   DATE OF ADMISSION:  02/27/2007  DATE OF DISCHARGE:                              HISTORY & PHYSICAL   DATE OF BIRTH:  Feb 14, 1932   PRIMARY CARE PHYSICIAN:  Marga Melnick.   CHIEF COMPLAINT:  Shortness of breath.   HISTORY OF PRESENT ILLNESS:  A 77 year old white female with history of  chronic obstructive pulmonary disease and ongoing tobacco abuse presents  to the Emergency Room at [facility] today complaining of cold and  congestion since December 25.  She has had increasing symptoms of  shortness of breath despite increasing use of her Combivent.  She admits  to increased tobacco use over the holidays due to stress, but yesterday  was unable to smoke, only five cigarettes, because of the trouble  breathing.  Now in the last two days her activities of daily living are  limited by being unable to capture her breath, without any relief from  Combivent, moisturized shower air or rest, so she came to the Emergency  Room for evaluation.  Coughing in the night keeps her awake, but she has  scant sputum and only occasional cough at the day.  No chest pain, no  nausea, vomiting, no lower extremity swelling, no fevers, chills, no  recent upper respiratory infection or travel, no history of congestive  heart failure.   PAST MEDICAL HISTORY:  1. Hypertension.  2. Dyslipidemia.  3. Chronic obstructive pulmonary disease.  4. Spinal stenosis with left lower extremity numbness.   CURRENT MEDICATIONS:  1. Toprol XL 100 mg half-tablet daily.  2. Vytorin 10/20 one-half tablet daily.  3. Neurontin 100 mg t.i.d. p.r.n. leg pain.  4. Advair 100/50 b.i.d.  5. Combivent inhaler two puffs q. 4 hours p.r.n.  6. Chlorthalidone 25 mg half-tablet  daily.  7. Aspirin 81 mg daily.  8. Mucinex over the counter one tablet t.i.d. p.r.n.   ALLERGIES:  DEMEROL, DICLOFENAC, DAYPRO, LESCOL AND HYDROCODONE.   FAMILY HISTORY:  Mother deceased at age 75, with long history of  arthritis and an MI in her 20s.  Father deceased at age 76 due to kidney  disease.   SOCIAL HISTORY:  She has ongoing tobacco abuse ranging from a half to  one pack a day.  No alcohol.  She lives with her daughter and son-in-  Social worker.   REVIEW OF SYSTEMS:  No recent changes.  No pleurisy, no history of  congestive heart failure.  She does have stress incontinence, will  occasionally wear a pad.  Chronic lower extremity numbness on the left  side, with occasional pain in the same leg.  No back symptoms please see  HPI above for other review of systems, otherwise negative except as  listed.   PHYSICAL EXAM:  VITAL SIGNS:  Temperature 98.2, blood pressure 94/52,  pulse of 101, respirations 22, satting 92%  on 3 liters, previously 88%  on room air at presentation to ER.  GENERAL:  She is a very pleasant reserved white female, mild dyspnea  with conversation at rest but nontoxic, in no acute distress, sitting at  50-degree angle on ER stretcher.  HEENT: Shows that she wears corrective lenses at all times, PERRLA,  EOMI, oropharynx is clear.  NECK:  Her neck is short but supple, there is no JVD, LAD or goiter.  LUNGS:  Show bilateral crackles at the bases, but moderate, good air  movement throughout without wheeze or other use of accessory respiratory  muscles.  CARDIOVASCULAR:  Is slightly tachy, with occasional PVCs on monitors but  otherwise regular rhythm and no murmurs or rubs.  ABDOMEN:  Is protuberant and firm but nontender with good bowel sounds.  EXTREMITIES:  Show no edema or tightness in the bilateral calves, slight  bruising to the left lateral shin from an injury last week.  NEUROLOGICAL:  She is awake, alert, oriented x4, she has a soft voice,  slightly  hoarse and gravelly.  Cranial nerves II-XII are symmetric,  intact.  She moves all extremities well.  Gait is not tested.  There are  no cognitive deficits noted.   LABORATORY DATA:  White count 9.1, hemoglobin 13.5, platelets 198,  sodium 132, potassium 3.2, chloride 102, bicarb 27, BUN 20, creatinine  1.04, and glucose, random, 118.  2-D chest x-ray shows bronchiectatic  changes, chronic obstructive pulmonary disease changes, also chronic,  there are no acute disease findings, including no airspace disease or  effusions.   ASSESSMENT AND PLAN:  1. Acute chronic obstructive pulmonary disease exacerbation with      probable bronchitis.  This is complicated by the patient's      additional tobacco abuse which is ongoing.  Mild hypoxia but no      clear infiltrate or effusion on chest x-ray.  Will admit at this      time for further eval and treatment using nebulizers, empiric IV      antibiotics, Avelox, oxygen as needed, and her home Mucinex.  Check      a D-dimer and if abnormal, will pursue CT chest to rule out      pulmonary embolus.  Question underlying diagnosis of      bronchiectasis.  If the patient is unimproved in the next 48 hours      despite med treatment, [place] warrant further pulmonary evaluation as      an inpatient.  I discussed with the patient her need for tobacco      cessation, to which she agrees and will follow through following      discharge.  2. Hypertension history.  Slight hypotension here, not toxic      clinically.  Will hold home Toprol and diuretic at this time.      Gentle intravenous fluid and observation.  3. Mild hyponatremia.  Question if this is due to diuretic use prior      to admission plus/minus current pulmonary disease.  Will treat with      gentle normal saline intravenous fluid and hold diuretic at this      time.  Recheck basic metabolic in morning, also check thyroid      stimulating hormone.  4. Mild hypokalemia.  Most likely due to  diuretic use.  Will replace      and recheck in a.m.  5. Dyslipidemia.  Continue home Vytorin, check a.m. fasting lipid  profile.  6. Left lower extremity neuropathy with spinal stenosis history.  Will      continue home Neurontin as needed as prior to admission.  Physical      Therapy, Occupational Therapy as tolerated with recovery of #1.   Please see orders for further details.      Valerie A. Felicity Coyer, MD  Electronically Signed     VAL/MEDQ  D:  02/27/2007  T:  02/27/2007  Job:  [id number]

## 2010-07-07 NOTE — Discharge Summary (Signed)
NAMEMarland Kitchen  Olivia [name], Olivia [name] NO.:  [id number]   MEDICAL RECORD NO.:  [id number]          PATIENT TYPE:  INP   LOCATION:  1335                         FACILITY:  [facility]   PHYSICIAN:  Titus Dubin. Hopper, MD,FACP,FCCPDATE OF BIRTH:  [birth year]   DATE OF ADMISSION:  02/27/2007  DATE OF DISCHARGE:  03/05/2007                               DISCHARGE SUMMARY   ADMITTING DIAGNOSES:  1. Acute chronic obstructive lung disease exacerbation and bronchitis.  2. Nicotine addiction.  3. Hypertension by history.  4. Hyponatremia.  5. Hypokalemia.  6. Dyslipidemia.  7. Neuropathy.   DISCHARGE DIAGNOSIS:  Possible right upper lobe infiltrate suggesting  pneumonia.   HISTORY OF PRESENT ILLNESS:  For details of history, please see the  February 27, 2007, dictation by Dr. Felicity Coyer.   The patient was admitted and placed on aggressive pulmonary toilet and  IV Avelox.  She did not exhibit any significant temperature elevation  during the hospitalization and vital signs were surprisingly stable.  With supplemental oxygen O2 sats were maintained for the most part over  90%, although she did exhibit desaturations with O2 sats as low as 83%  intermittently.   The admitting chest x-ray showed bronchitic change without definite  focal infiltrate.   On January 6, she had increased air hunger prompting the CT.  This  revealed no evidence of pulmonary embolus.  There was parenchymal  opacity in the right upper lobe in the medial aspect of the lingula.  Differential was scarring versus possible pneumonia.  Portable chest x-  ray on January 8 showed increasing basilar opacities, left greater than  right, consistent with atelectasis or pneumonia.  She was treated with  nebulized bronchodilators and incentive spirometry.  Follow-up film the  day prior to discharge showed COPD with scarring at the bases and  possible subtle right upper lobe infiltrate.   Her admission CBC revealed a normal white  count and hematocrit 38.8.  Her initial potassium was 3.2 and glucose 118.  Her GFR was mildly  reduced at 52.  A D-dimer had been mildly elevated at 0.53 with normals  less than 0.48.  As stated, the CT angiogram did not reveal any PE.  Potassium was repleted with normalization.   She does have dyslipidemia.  Lipids were at goal on Vytorin with the  exception of mildly reduced HDL of 42.   She did exhibit a mild anemia during the hospitalization which was  attributed to blood draws.  Hematocrit was 32.9.  Her B12 level was  within normal limits as was ferritin level.  Total iron was mildly  reduced at 29.  Folate was also normal.   The patient showed progressive improvement on parenteral antibiotics and  steroids and aggressive pulmonary toilet.  She did admit to intermittent  anxiety or panic.   On the day of discharge, she was afebrile with a pulse rate of 58-62.  Respiratory rate was 16-22.  Blood pressure was 125/62.  O2 sats were  93% on 3 meters per minute.  She exhibited increased AP diameter of the  chest.  Breath sounds  were markedly decreased.  There was no increased  work of breathing.  She had no lymphadenopathy of peripheral edema.  An  S4 was noted.   DISCHARGE MEDICATIONS:  She was discharged on her home medications:  1. Toprol XL 100 mg one daily.  2. Vytorin 10/20 one half at bedtime.  3. Neurontin 100 mg three times a day as needed for neuropathic      symptoms.  4. Advair 100/50 one inhalation every 12 hours.  5. Combivent 1-2 puffs every 4-6 hours as needed.  6. Chlorthalidone 25 mg one half daily.  7. Aspirin 81 mg.  8. Mucinex as needed for thick secretions.  9. Avelox 400 mg once daily for 5 days.  10.Her prednisone was to be weaned.  She was to take 20 mg 1/2 three      times a day for 2 days then one-half daily for 2 days and then      discontinue it.  11.She was discharged on new medicines of:      a.     Paroxetine 20 mg daily.      b.     Xanax  0.5 mg one every 6 hours as needed for anxiety.      c.     She did have some mild thrush and Magic Mouthwash was       prescribed.      d.     She will use the Combivent when away from home.      e.     We will provided her with portable oxygen at 2 liters per       minute.      f.     She is to use DuoNeb in the nebulizer every 4-6 hours while       at home as this has subjectively resulted in improvement in her       respiratory status.   DISCHARGE STATUS:  Improved, but prognosis is guarded if she returns to  smoking.  This was discussed frankly with her.  The diet will be of  choice.      Titus Dubin. Alwyn Ren, MD,FACP,FCCP  Electronically Signed     WFH/MEDQ  D:  03/05/2007  T:  03/06/2007  Job:  [id number]

## 2010-07-10 NOTE — H&P (Signed)
NAMEMarland [name]  Olivia, [name] NO.:  [id number]   MEDICAL RECORD NO.:  [id number]          PATIENT TYPE:  EMS   LOCATION:  ED                           FACILITY:  [facility]   PHYSICIAN:  Wilmon Arms. Corliss Skains, M.D. DATE OF BIRTH:  02/20/1932   DATE OF ADMISSION:  05/14/2005  DATE OF DISCHARGE:                                HISTORY & PHYSICAL   CHIEF COMPLAINT:  Incarcerated prolapsed internal hemorrhoid.   The patient is a 77 year old female with a long history of hemorrhoid  disease who presents after colonoscopy earlier today by Dr. Jarold Motto. While  she was doing her bowel prep, her chronically prolapsed internal hemorrhoid  became hugely inflamed and incarcerated. This was causing exquisite pain.  After the colonoscopy, she was sent to the urgent office today.   On examination, she was found to have a very large, prolapsed, incarcerated  internal hemorrhoid.  I am unable to reduce this is the office. Therefore,  we are admitting her to the hospital for surgical treatment.   MEDICATIONS:  1.  Toprol XL 50 mg daily.  2.  Chlorthalidone 25 mg 1/2 tablet daily.  3.  Naproxen p.r.n.  4.  Vitorin p.r.n.  5.  Advair p.r.n.  6.  Mucinex p.r.n.  7.  Tylenol p.r.n.   ALLERGIES:  DEMEROL, DAYPRO, LESCOL, DICLOFENAC.   PAST SURGICAL HISTORY:  1.  D&C.  2.  Cervical ablation.   PAST MEDICAL HISTORY:  1.  Hypertension.  2.  COPD.  3.  Hemorrhoid disease.   SOCIAL HISTORY:  The patient smoked 3/4 pack a day, does not drink.   PHYSICAL EXAMINATION:  VITAL SIGNS:  Height 5 feet 3 inches, weight 157.  Blood pressure 118/77, pulse 83, temperature 98.4.  GENERAL:  This is an uncomfortable-appearing, elderly female in mild  distress.  ABDOMEN: Soft, nontender.  RECTAL:  Examination shows a large, left-sided, swollen hemorrhoid.  This is  a prolapsed internal hemorrhoid.  With direct palpation, I attempted to  reduce this, but the patient is unable to tolerate this due to  pain.   IMPRESSION:  Incarcerated, prolapsed internal hemorrhoid.   PLAN:  Admit to the hospital for surgical treatment tonight.  I have  discussed this with Dr. Orson Slick.      Wilmon Arms. Tsuei, M.D.  Electronically Signed     MKT/MEDQ  D:  05/14/2005  T:  05/15/2005  Job:  [id number]

## 2010-07-10 NOTE — Discharge Summary (Signed)
[facility]  Patient:    Olivia [name], Olivia [name] Visit Number: [id number] MRN: [id number]          Service Type: Attending:  Titus Dubin. Alwyn Ren, M.D. [facility] Dictated by:   Titus Dubin. Alwyn Ren, M.D. [facility]   CC:         Gerrit Friends. Dietrich Pates, M.D. [facility] [place]   Discharge Summary  ADMITTING DIAGNOSES: 1. Palpitations and atypical chest pain with premature ventricular    contractions and bigeminy noted on telemetry. 2. Chronic obstructive pulmonary disease. 3. Nicotine addiction.  DISCHARGE DIAGNOSES: 1. Palpitations and atypical chest pain with premature ventricular    contractions and bigeminy noted on telemetry. 2. Chronic obstructive pulmonary disease. 3. Nicotine addiction. 4. No evidence by myocardial enzyme measure of myocardial injury.  HISTORY OF PRESENT ILLNESS:  Olivia [name] is a 77 year old white female who measured her blood pressure to be up to 170/105 the night of March 4.  She also noted some palpitations while working in the yard and, again, the morning of March 5.  She had chest pain while working in the yard which encompassed the entire thorax.  It was described as dull, lasting less than a minute, without associated nausea or diaphoresis.  She contacted my office and was referred to the emergency room because of the chest pain and palpitations.  In the emergency room, blood pressure had dropped and she was in no acute distress, but PVCs were noted on telemetry.  These appeared to be more unifocal with a bigeminal pattern.  Because of this, she was admitted.  This actually represents only her second admission.  She is gravida 1, para 1. She had an outpatient D&C.  FAMILY HISTORY:  Positive for stroke in paternal grandparents.  Her paternal grandmother had a myocardial infarction.  The paternal grandmother had cancer of the cervix and her brother had breast cancer and melanoma.  ALLERGIES:  She is intolerant or allergic to DEMEROL, HYDROCODONE, and  ULTRAM, but can take Ultraset.  She does not drink.  She drinks a pack a day.  REVIEW OF SYSTEMS:  Negative for fever, chills, sweats, or active GI or genitourinary symptoms.  She has had a rattly cough with the production of gray sputum over the previous week.  She has hot flashes which she feels are related to hormonal insufficiency.  PHYSICAL EXAMINATION:  VITAL SIGNS:  Blood pressure was 129/92 and O2 saturations were 96% on 2 L. Pulse was 91 with the PVCs as noted.  She exhibited a slight hyponasal speech pattern.  Heart sound was distant with an S4.  ______  exam revealed arteriolar narrowing.  Breath sounds were decreased; she did exhibit a nonproductive rattly cough.  ABDOMEN:  Nontender.  Trace edema was noted.  EXTREMITIES:  Homans sign was negative.  Slight clubbing of nail beds was noted but there was no cyanosis.  LABORATORY DATA:  Chest x-ray revealed cardiomegaly without any active process.  CBC, CMET, CPK, and troponin initially were negative.  HOSPITAL COURSE:  She was kept in the emergency room until a bed became available the morning of March 6.  She continued to show PVCs which again were unifocal without any high-grade malignant rhythms.  Serial CPK and MBs were negative.  She was seen in consultation by Dr. Dietrich Pates who recommended decreasing her beta blocker because he thought it might aggravate her asthmatic bronchitis. In actuality, she had been on Toprol XL, a selective beta blocker at 100 mg one-half daily.  It had been increased at  the time of admission because of the PVCs and hypertension.  He recommended chlorthalidone 12.5 mg daily and consideration of adding an ACE inhibitor if needed.  A stress Cardiolite was scheduled as an outpatient for 04/28/01 at 10:30.  On March 6, she was asymptomatic with no chest pain and cardiac enzymes negative x 3 for CPK and MB as well as troponin.  She was discharged with arrangements made for completion of the  adenosine Cardiolite as an outpatient at the [facility] building.  She was discharged on Amoxil 875 twice a day along with Duratuss G 1200 ng twice a day for 10 days.  She was encouraged to stop smoking because of the cardiovascular/respiratory risk.  She was given a prescription for Buspar 15 mg twice a day to aid in smoking cessation if she wished.  She will return to the original dose of Toprol 100 mg one-half daily and no changes was made in other medications which include:  1. Pravachol 40 mg at bedtime. 2. Ultraset every 4 to 6 hours as needed. 3. Mobic 7.5 mg twice a day. 4. Combivent one or two puffs every 4 hours as needed. 5. She will also use Astelin one or two sprays twice a day as needed.  She has the option to using the nicotine patch if needed as an aid to smoking cessation.  Discharge status is improved.  Prognosis depends on addressing the risk factors and the findings on the adenosine Cardiolite. Dictated by:   Titus Dubin. Alwyn Ren, M.D. [facility] Attending:  Titus Dubin. Alwyn Ren, M.D. [facility] DD:  04/27/01 TD:  04/28/01 Job: [id number]

## 2010-07-15 ENCOUNTER — Telehealth: Payer: Self-pay | Admitting: Internal Medicine

## 2010-07-15 MED ORDER — FLUTICASONE-SALMETEROL 100-50 MCG/DOSE IN AEPB: 1.0000 | INHALATION_SPRAY | Freq: Two times a day (BID) | RESPIRATORY_TRACT | Status: DC

## 2010-07-15 NOTE — Telephone Encounter (Signed)
Medication sent to pharmacy  

## 2010-08-05 ENCOUNTER — Other Ambulatory Visit: Payer: Self-pay | Admitting: Internal Medicine

## 2010-08-05 MED ORDER — CHLORTHALIDONE 25 MG PO TABS: ORAL_TABLET | ORAL | Status: DC

## 2010-08-05 NOTE — Telephone Encounter (Signed)
RX sent to pharmacy  

## 2010-08-07 ENCOUNTER — Other Ambulatory Visit: Payer: Self-pay | Admitting: Internal Medicine

## 2010-10-10 ENCOUNTER — Other Ambulatory Visit: Payer: Self-pay | Admitting: Internal Medicine

## 2010-10-17 ENCOUNTER — Other Ambulatory Visit: Payer: Self-pay | Admitting: Internal Medicine

## 2010-10-19 NOTE — Telephone Encounter (Signed)
RX was sent in on 10/10/2010, if not received left message for pharmacy to fill at this time

## 2010-10-21 ENCOUNTER — Other Ambulatory Visit: Payer: Self-pay | Admitting: Gynecology

## 2010-11-11 ENCOUNTER — Other Ambulatory Visit: Payer: Self-pay | Admitting: Internal Medicine

## 2010-11-11 NOTE — Telephone Encounter (Signed)
Left message on voicemail informing pharmacy rx was filled on 07/2010 #45 with 2 refills. If not on file please fill

## 2010-11-12 LAB — TSH: TSH: 1.947

## 2010-11-12 LAB — DIFFERENTIAL
Basophils Absolute: 0
Eosinophils Absolute: 0.2
Eosinophils Relative: 2
Lymphocytes Relative: 16
Lymphs Abs: 1.4
Neutrophils Relative %: 78 — ABNORMAL HIGH

## 2010-11-12 LAB — CBC
HCT: 32.9 — ABNORMAL LOW
Hemoglobin: 13.5
MCHC: 34.7
MCHC: 34.8
MCV: 92.1
MCV: 93
Platelets: 161
Platelets: 164
RBC: 4.21
RDW: 15
WBC: 10.1
WBC: 8.8
WBC: 9.1

## 2010-11-12 LAB — D-DIMER, QUANTITATIVE: D-Dimer, Quant: 0.53 — ABNORMAL HIGH

## 2010-11-12 LAB — COMPREHENSIVE METABOLIC PANEL
ALT: 25
AST: 34
CO2: 27
Calcium: 9.2
Chloride: 102
Creatinine, Ser: 1.04
GFR calc Af Amer: >60
GFR calc non Af Amer: 52 — ABNORMAL LOW
Glucose, Bld: 118 — ABNORMAL HIGH
Total Bilirubin: 0.8

## 2010-11-12 LAB — BASIC METABOLIC PANEL
BUN: 16
BUN: 18
CO2: 25
Calcium: 8.9
Chloride: 113 — ABNORMAL HIGH
GFR calc non Af Amer: 54 — ABNORMAL LOW
Glucose, Bld: 105 — ABNORMAL HIGH
Glucose, Bld: 143 — ABNORMAL HIGH
Potassium: 3.8

## 2010-11-12 LAB — LIPID PANEL
Cholesterol: 130
HDL: 42
LDL Cholesterol: 75
Triglycerides: 65

## 2010-11-26 ENCOUNTER — Other Ambulatory Visit: Payer: Self-pay | Admitting: Internal Medicine

## 2010-11-26 MED ORDER — FLUTICASONE-SALMETEROL 100-50 MCG/DOSE IN AEPB: 1.0000 | INHALATION_SPRAY | Freq: Two times a day (BID) | RESPIRATORY_TRACT | Status: DC

## 2010-11-26 NOTE — Telephone Encounter (Signed)
RX sent

## 2011-02-04 ENCOUNTER — Other Ambulatory Visit: Payer: Self-pay | Admitting: Internal Medicine

## 2011-02-10 ENCOUNTER — Telehealth: Payer: Self-pay | Admitting: Internal Medicine

## 2011-02-10 NOTE — Telephone Encounter (Signed)
Patient needs refill metoprolol [facility]

## 2011-02-11 MED ORDER — METOPROLOL SUCCINATE ER 25 MG PO TB24: ORAL_TABLET | ORAL | Status: DC

## 2011-02-11 NOTE — Telephone Encounter (Signed)
RX sent

## 2011-03-17 ENCOUNTER — Other Ambulatory Visit: Payer: Self-pay | Admitting: Internal Medicine

## 2011-03-22 ENCOUNTER — Other Ambulatory Visit: Payer: Self-pay | Admitting: Internal Medicine

## 2011-05-10 ENCOUNTER — Other Ambulatory Visit: Payer: Self-pay | Admitting: Internal Medicine

## 2011-05-10 NOTE — Telephone Encounter (Signed)
Patient needs to schedule a CPX  

## 2011-05-12 ENCOUNTER — Other Ambulatory Visit: Payer: Self-pay | Admitting: Internal Medicine

## 2011-05-13 NOTE — Telephone Encounter (Signed)
Patient needs to schedule a CPX  

## 2011-06-29 ENCOUNTER — Encounter: Payer: Self-pay | Admitting: Internal Medicine

## 2011-06-29 ENCOUNTER — Ambulatory Visit (INDEPENDENT_AMBULATORY_CARE_PROVIDER_SITE_OTHER): Payer: Medicare Other | Admitting: Internal Medicine

## 2011-06-29 VITALS — BP 110/68 | HR 58 | Temp 97.7°F | Wt 165.0 lb

## 2011-06-29 DIAGNOSIS — R439 Unspecified disturbances of smell and taste: Secondary | ICD-10-CM

## 2011-06-29 DIAGNOSIS — R438 Other disturbances of smell and taste: Secondary | ICD-10-CM

## 2011-06-29 DIAGNOSIS — R490 Dysphonia: Secondary | ICD-10-CM

## 2011-06-29 NOTE — Patient Instructions (Addendum)
Magic mouthwash 5 cc (1 teaspoon) gargled well  and swallowed  3 times a day  Stop lite salt. Advair one inhalation every 12 hours; gargle well and spit after use  Please try to go on My Chart within the next 24 hours to allow me to release the results directly to you.

## 2011-06-29 NOTE — Progress Notes (Signed)
  Subjective:    Patient ID: Olivia [name], female    DOB: Oct 25, 1931, 78 y.o.   MRN: [id number]  HPI Onset 6 weeks ago w/o trigger; constant now. Better with mouthwash for 30 minutes.No new vision , hearing or smell issues. She gargles after using Advair twice a day.She uses" lite salt"    Review of Systems Fever/chills/sweats/ weight loss:no Frontal headache:no Facial pain:no Nasal purulence: no Sore throat:no Dental pain:no Lymphadenopathy:no Cough/sputum/hemoptysis:no Associated extrinsic/allergic symptoms:itchy eyes/ sneezing:perennial itchy eyes Past medical history: Seasonal allergies: no Smoking history:quit 2009            Objective:   Physical Exam General appearance:well nourished; no acute distress or increased work of breathing is present.  No  lymphadenopathy about the head, neck, or axilla noted.   Eyes: No conjunctival inflammation or lid edema is present. There is no scleral icterus. FOV normal.  Ears:  External ear exam shows no significant lesions or deformities.  Otoscopic examination reveals clear canals, tympanic membranes are intact bilaterally without bulging, retraction, inflammation or discharge.  Nose:  External nasal examination shows no deformity or inflammation. Nasal mucosa are pink and moist without lesions or exudates. No septal dislocation or deviation.No obstruction to airflow.   Neck: thyroid normal  Oral exam: Dental hygiene is good; lips and gums are healthy appearing.There is no oropharyngeal erythema or exudate noted.  Slightly hoarse. No clinical Candiasis  Heart:  Normal rate and regular rhythm. S1 and S2 normal without gallop, murmur, click, rub or other extra sounds.  Sounds distant  Lungs:Chest clear to auscultation; no wheezes, rhonchi,rales ,or rubs present.No increased work of breathing.  BS decreased  Extremities:  No cyanosis, edema, or clubbing  noted   Neuro: DTRs WNL   Skin: Warm & dry          Assessment &  Plan:  #1 salty taste #2 hoarseness

## 2011-06-30 LAB — TSH: TSH: 5.06 u[IU]/mL (ref 0.35–5.50)

## 2011-07-05 ENCOUNTER — Other Ambulatory Visit: Payer: Self-pay | Admitting: Internal Medicine

## 2011-08-01 ENCOUNTER — Other Ambulatory Visit: Payer: Self-pay | Admitting: Internal Medicine

## 2011-08-24 ENCOUNTER — Ambulatory Visit (INDEPENDENT_AMBULATORY_CARE_PROVIDER_SITE_OTHER): Payer: Medicare Other | Admitting: Internal Medicine

## 2011-08-24 ENCOUNTER — Encounter: Payer: Self-pay | Admitting: Internal Medicine

## 2011-08-24 VITALS — BP 122/74 | HR 53 | Temp 97.8°F | Resp 14 | Ht 62.03 in | Wt 164.4 lb

## 2011-08-24 DIAGNOSIS — R7309 Other abnormal glucose: Secondary | ICD-10-CM

## 2011-08-24 DIAGNOSIS — H9190 Unspecified hearing loss, unspecified ear: Secondary | ICD-10-CM

## 2011-08-24 DIAGNOSIS — Z860100 Personal history of colon polyps, unspecified: Secondary | ICD-10-CM

## 2011-08-24 DIAGNOSIS — K148 Other diseases of tongue: Secondary | ICD-10-CM

## 2011-08-24 DIAGNOSIS — R079 Chest pain, unspecified: Secondary | ICD-10-CM

## 2011-08-24 DIAGNOSIS — I10 Essential (primary) hypertension: Secondary | ICD-10-CM

## 2011-08-24 DIAGNOSIS — Z0000 Encounter for general adult medical examination without abnormal findings: Secondary | ICD-10-CM

## 2011-08-24 DIAGNOSIS — E785 Hyperlipidemia, unspecified: Secondary | ICD-10-CM

## 2011-08-24 NOTE — Progress Notes (Signed)
Subjective:    Patient ID: Olivia [name], female    DOB: 1931-09-10, 78 y.o.   MRN: [id number]  HPI Medicare Wellness Visit:  The following psychosocial & medical history were reviewed as required by Medicare.   Social history: caffeine: 21/2 mugs of coffee/Dr Pepper 1/3 bottle daily/chocolate rarely , alcohol:  no ,  tobacco use : quit 2009  & exercise : no.   Home & personal  safety / fall risk: gait unsteady, activities of daily living: no limitations , seatbelt use : yes , and smoke alarm employment :yes .  Power of Attorney/Living Will status : NO  Vision ( as recorded per Nurse) & Hearing  evaluation : vision was 20/20 last month @ [facility]. Orientation :oriented X 3 , memory & recall :good ,  math testing: fair,and mood & affect : normal . Depression / anxiety: denied Travel history : [place] 1998 , immunization status :Shingles needed , transfusion history: no, and preventive health surveillance ( colonoscopies, BMD , etc as per protocol/ [facility]): colonoscopy ? due, Dental care:  Seen every 6 months . Chart reviewed &  Updated. Active issues reviewed & addressed.       Review of Systems HYPERTENSION: Disease Monitoring: Blood pressure range-not monitored regularly Chest pain, palpitations- some pain when supine @ night; eating @ bedtime       Dyspnea-  Medications: Compliance- yes  Lightheadedness,Syncope- no but gait unsteady    Edema- slight LLE  FASTING HYPERGLYCEMIA, PMH of: FBS 143 in 2009 Polyuria/phagia/dipsia- only  polydipsia       Visual problems- no  HYPERLIPIDEMIA: Disease Monitoring: See symptoms for Hypertension Medications: Compliance- no since 11/12 as no lab F/U Abd pain, bowel changes-no   Muscle aches- yes as leg cramps  She and her daughters noted a white spot on the tip of the tongue sometime in the last month. Her daughter questioned possible change. She thinks she Tarbell have bitten her tongue        Objective:   Physical Exam Gen.: well-nourished in  appearance. Alert, appropriate and cooperative throughout exam. Head: Normocephalic without obvious abnormalities  Eyes: No corneal or conjunctival inflammation noted. Pupils equal round reactive to light and accommodation.  Extraocular motion intact. Vision grossly normal with lenses. Ears: External  ear exam reveals no significant lesions or deformities. Canals clear .TMs normal. Hearing is grossly decreased on L. Nose: External nasal exam reveals no deformity or inflammation. Nasal mucosa are pink and moist. No lesions or exudates noted.  Mouth: Oral mucosa and oropharynx reveal no lesions or exudates. Teeth in good repair. Punctate hypopigmentation at the tip of the tongue Neck: No deformities, masses, or tenderness noted. Range of motion & Thyroid normal Lungs: Normal respiratory effort; chest expands symmetrically. Lungs are clear to auscultation without rales, wheezes, or increased work of breathing. Decreased breath sounds in all lung fields Heart: Normal rate and rhythm. Normal S1 and S2. No gallop, click, or rub. S4 with slurring; no murmur. Heart sounds distant Abdomen: Bowel sounds normal; abdomen soft and nontender. No masses, organomegaly or hernias noted. Genitalia:Dr Lomax  .  Musculoskeletal/extremities: Lordosis noted of  the thoracic  spine. No  cyanosis, edema, or deformity noted.Minor clubbing. Range of motion  normal .Tone & strength  normal.Joints normal. Nail health  good. Vascular: Carotid, radial artery, dorsalis pedis and  posterior tibial pulses are full and equal. No bruits present. Neurologic: Alert and oriented x3. Deep tendon reflexes symmetrical and normal.          Skin: Intact without suspicious lesions or rashes. Lymph: No cervical, axillary lymphadenopathy present. Psych: Mood and affect are normal. Normally interactive                                                                                          Assessment & Plan:  #1 Medicare Wellness Exam; criteria met ; data entered #2 Problem List reviewed ; Assessment/ Recommendations made   #3 punctate lesion tip of tongue; clinically I doubt leukoplakia but ENT consult appropriate  #4 decreased auditory acuity on the left; Audiology evaluation can be completed with #3 Plan: see Orders

## 2011-08-24 NOTE — Patient Instructions (Addendum)
Preventive Health Care: Eat a low-fat diet with lots of fruits and vegetables, up to 7-9 servings per day.  Consume less than 30 grams of sugar per day from foods & drinks with High Fructose Corn Syrup as # 1,2,3 or #4 on label. Health Care Power of Attorney & Living Will place you in charge of your health care  decisions. Verify these are  in place. Please try to go on My Chart within the next 24 hours to allow me to release the results directly to you.

## 2011-08-25 ENCOUNTER — Telehealth: Payer: Self-pay

## 2011-08-25 LAB — CBC WITH DIFFERENTIAL/PLATELET
Basophils Relative: 0.5 % (ref 0.0–3.0)
Eosinophils Absolute: 0.1 10*3/uL (ref 0.0–0.7)
Hemoglobin: 12.8 g/dL (ref 12.0–15.0)
MCHC: 33.2 g/dL (ref 30.0–36.0)
MCV: 93.4 fl (ref 78.0–100.0)
Monocytes Absolute: 0.4 10*3/uL (ref 0.1–1.0)
Neutro Abs: 3.8 10*3/uL (ref 1.4–7.7)
Neutrophils Relative %: 68.7 % (ref 43.0–77.0)
RBC: 4.11 Mil/uL (ref 3.87–5.11)

## 2011-08-25 LAB — LIPID PANEL
Cholesterol: 215 mg/dL — ABNORMAL HIGH (ref 0–200)
Triglycerides: 148 mg/dL (ref 0.0–149.0)

## 2011-08-25 LAB — HEPATIC FUNCTION PANEL
ALT: 17 U/L (ref 0–35)
Total Protein: 7.7 g/dL (ref 6.0–8.3)

## 2011-08-25 LAB — BASIC METABOLIC PANEL
CO2: 30 mEq/L (ref 19–32)
Chloride: 100 mEq/L (ref 96–112)
Creatinine, Ser: 1.1 mg/dL (ref 0.4–1.2)
Glucose, Bld: 89 mg/dL (ref 70–99)

## 2011-08-25 LAB — LDL CHOLESTEROL, DIRECT: Direct LDL: 129.2 mg/dL

## 2011-08-25 LAB — TSH: TSH: 6.29 u[IU]/mL — ABNORMAL HIGH (ref 0.35–5.50)

## 2011-08-25 MED ORDER — LEVOTHYROXINE SODIUM 25 MCG PO TABS: ORAL_TABLET | ORAL | Status: DC

## 2011-08-25 NOTE — Telephone Encounter (Signed)
rx sent per MD request

## 2011-08-25 NOTE — Telephone Encounter (Signed)
Message copied by Maurice Small on Wed Aug 25, 2011  4:16 PM ------      Message from: Pecola Lawless      Created: Wed Aug 25, 2011  4:08 PM       Please send a prescription for levothyroxine 25 mcg; dispense 90, do not take within 2 hours of any other medications. Labs sent through My Chart

## 2011-09-20 ENCOUNTER — Other Ambulatory Visit: Payer: Self-pay | Admitting: Internal Medicine

## 2011-10-01 ENCOUNTER — Telehealth: Payer: Self-pay | Admitting: *Deleted

## 2011-10-01 NOTE — Telephone Encounter (Signed)
Pt called stating that she started taking levothyroxine & since being prescribed this med Dr. Danella Deis prescribed fluorouracil to be applied BID. Pt wanted to make sure that both of these meds are ok to be taken at the same time. Please advise.

## 2011-10-01 NOTE — Telephone Encounter (Signed)
OK together

## 2011-10-01 NOTE — Telephone Encounter (Signed)
Discuss with patient  

## 2011-11-07 ENCOUNTER — Other Ambulatory Visit: Payer: Self-pay | Admitting: Internal Medicine

## 2011-11-10 ENCOUNTER — Encounter: Payer: Self-pay | Admitting: Gastroenterology

## 2011-11-24 ENCOUNTER — Other Ambulatory Visit: Payer: Self-pay | Admitting: Internal Medicine

## 2011-11-25 NOTE — Telephone Encounter (Signed)
TSH 244.9 

## 2011-12-05 ENCOUNTER — Other Ambulatory Visit: Payer: Self-pay | Admitting: Internal Medicine

## 2011-12-07 ENCOUNTER — Other Ambulatory Visit: Payer: Self-pay | Admitting: Internal Medicine

## 2011-12-07 NOTE — Telephone Encounter (Signed)
Not a maintenance medication, please advise

## 2011-12-07 NOTE — Telephone Encounter (Signed)
Spoke with patient, patient states she frequently bites her tongue and she feels this help with that.   Per Dr.Hopper do not fill, patient can gargle with saline

## 2011-12-07 NOTE — Telephone Encounter (Signed)
Need reason ; not maintenance

## 2011-12-27 ENCOUNTER — Other Ambulatory Visit: Payer: Self-pay | Admitting: Internal Medicine

## 2011-12-28 NOTE — Telephone Encounter (Signed)
Rx sent.    MW 

## 2012-01-11 ENCOUNTER — Ambulatory Visit (INDEPENDENT_AMBULATORY_CARE_PROVIDER_SITE_OTHER): Payer: Medicare Other | Admitting: Internal Medicine

## 2012-01-11 ENCOUNTER — Encounter: Payer: Self-pay | Admitting: Internal Medicine

## 2012-01-11 VITALS — BP 122/80 | HR 55 | Temp 97.6°F | Wt 165.0 lb

## 2012-01-11 DIAGNOSIS — M79605 Pain in left leg: Secondary | ICD-10-CM

## 2012-01-11 DIAGNOSIS — E039 Hypothyroidism, unspecified: Secondary | ICD-10-CM | POA: Insufficient documentation

## 2012-01-11 DIAGNOSIS — R490 Dysphonia: Secondary | ICD-10-CM

## 2012-01-11 DIAGNOSIS — J449 Chronic obstructive pulmonary disease, unspecified: Secondary | ICD-10-CM

## 2012-01-11 DIAGNOSIS — M79609 Pain in unspecified limb: Secondary | ICD-10-CM

## 2012-01-11 MED ORDER — TIOTROPIUM BROMIDE MONOHYDRATE 18 MCG IN CAPS: 18.0000 ug | ORAL_CAPSULE | Freq: Every day | RESPIRATORY_TRACT | Status: DC

## 2012-01-11 MED ORDER — TRAMADOL HCL 50 MG PO TABS: 50.0000 mg | ORAL_TABLET | Freq: Three times a day (TID) | ORAL | Status: DC | PRN

## 2012-01-11 NOTE — Progress Notes (Signed)
Subjective:    Patient ID: Olivia [name], female    DOB: [birth year], 78 y.o.   MRN: [id number]  HPI  Her chief concern is hoarseness which has been present since last Spring and is progressive. Her voice is described as raspy and she loses her voice when talking on the phone for instance. She has no history of laryngeal polyps. She did smoke from age [age] up to one pack per day. She is on thyroid replacement.  She describes severe aching/ cramping  intermittent pain in her lower legs, left greater than right for several years. This occurs at rest. It does seem to improve with ambulation. It seems to be helped most with physical juice or mustard in particular. She has PMH of spinal stenosis.  She enjoys an occasional screwdriver and is inquiring about her ability to drink alcohol  Combivent is no longer available; she is requesting a substitute medication    Review of Systems Constitutional: no fever, chills, sweats, change in weight  Musculoskeletal:no joint stiffness, redness, or swelling Skin:no rash, color/temp  change Neuro: no weakness;associated  incontinence (stool/urine); numbness L > R foot Heme:no lymphadenopathy; abnormal bruising or bleeding         Objective:   Physical Exam Gen.:  well-nourished in appearance. Alert, appropriate and cooperative throughout exam.  Eyes: No corneal or conjunctival inflammation noted. No icterusEars: External  ear exam reveals no significant lesions or deformities. Canals clear .TMs normal. Hearing is grossly normal bilaterally. Nose: External nasal exam reveals no deformity or inflammation. Nasal mucosa are pink and moist. No lesions or exudates noted.  Mouth: Oral mucosa and oropharynx reveal no lesions or exudates. Teeth in good repair.Hoarse Neck: No deformities, masses, or tenderness noted. Range of motion & Thyroid normal Lungs: Normal respiratory effort; chest expands symmetrically. Lungs are clear to auscultation without rales,  wheezes, or increased work of breathing.Decreased BS Heart: Normal rate and rhythm. Normal S1 and S2. No gallop, click, or rub. No murmur. PMI in epigastrium Abdomen: Bowel sounds normal; abdomen soft and nontender. No masses, organomegaly or hernias noted.                                                                              Musculoskeletal/extremities: Accentuated  thoracic curve. No clubbing, cyanosis, edema, or deformity noted. Joints normal. Nail health  Good.No onycholysis Vascular: Carotid, radial artery, dorsalis pedis and  posterior tibial pulses are full and equal. No bruits present. Neurologic: Alert and oriented x3. Deep tendon reflexes symmetrical and normal.          Skin: Intact without suspicious lesions or rashes. Lymph: No cervical, axillary lymphadenopathy present. Psych: Mood and affect are normal. Normally interactive  Assessment & Plan:  #1 hoarseness,hypothyroid   #2 leg pain; this is most likely related to her spinal stenosis. Restless leg is not suggested  #3 COPD; substitute needed for Combivent  Plan: See orders and recommendations

## 2012-01-11 NOTE — Patient Instructions (Addendum)
Alcohol If you drink, do it moderately,less than 9 drinks per week, preferably less than 6 @ most.Review and correct the record as indicated. Please share record with all medical staff seen.

## 2012-01-12 LAB — MAGNESIUM: Magnesium: 2.1 mg/dL (ref 1.5–2.5)

## 2012-01-12 LAB — TSH: TSH: 2.95 u[IU]/mL (ref 0.35–5.50)

## 2012-01-28 ENCOUNTER — Other Ambulatory Visit: Payer: Self-pay | Admitting: Internal Medicine

## 2012-01-31 ENCOUNTER — Institutional Professional Consult (permissible substitution): Payer: Medicare Other | Admitting: Internal Medicine

## 2012-02-04 ENCOUNTER — Ambulatory Visit (INDEPENDENT_AMBULATORY_CARE_PROVIDER_SITE_OTHER): Payer: Medicare Other | Admitting: Critical Care Medicine

## 2012-02-04 ENCOUNTER — Encounter: Payer: Self-pay | Admitting: Critical Care Medicine

## 2012-02-04 VITALS — BP 124/80 | HR 68 | Temp 97.5°F | Ht 65.0 in | Wt 162.0 lb

## 2012-02-04 DIAGNOSIS — J449 Chronic obstructive pulmonary disease, unspecified: Secondary | ICD-10-CM

## 2012-02-04 MED ORDER — FLUTICASONE-SALMETEROL 250-50 MCG/DOSE IN AEPB: 1.0000 | INHALATION_SPRAY | Freq: Two times a day (BID) | RESPIRATORY_TRACT | Status: DC

## 2012-02-04 NOTE — Patient Instructions (Signed)
Increase Advair 250 one puff twice daily Stay on Spiriva daily You are cleared for planned surgery Return as needed

## 2012-02-04 NOTE — Assessment & Plan Note (Signed)
Gold class C. COPD without current exacerbation Plan This patient Olivia [name] now be cleared for planned ENT procedure Increase Advair to 250 one inhalation twice daily No systemic steroids indicated No need for supplemental oxygen Maintain Spiriva

## 2012-02-04 NOTE — Progress Notes (Signed)
Subjective:    Patient ID: Olivia [name], female    DOB: [birth year], 78 y.o.   MRN: [id number]  Shortness of Breath This is a chronic problem. The current episode started more than 1 year ago. The problem occurs daily (exertional dyspnea is noted.  ). The problem has been gradually worsening. Associated symptoms include wheezing. Pertinent negatives include no abdominal pain, chest pain, claudication, coryza, ear pain, fever, headaches, hemoptysis, leg pain, leg swelling, neck pain, orthopnea, PND, rash, rhinorrhea, sore throat, sputum production, swollen glands, syncope or vomiting. The symptoms are aggravated by lying flat, fumes and odors. Risk factors include smoking. Treatments tried: spiriva. The treatment provided moderate relief. Her past medical history is significant for COPD and pneumonia. There is no history of allergies, aspirin allergies, asthma, bronchiolitis, CAD, DVT, a heart failure or PE.   78 y.o. WF  Hx of hoarseness and nodule on VC seen.  Dr Pollyann Kennedy wants to excise nodule. Pt denies cough.  Pt notes dyspnea with exertion. Pt on spiriva now and helps. Dx Copd long term.  Past Medical History  Diagnosis Date  . COPD (chronic obstructive pulmonary disease)   . Myopathy   . Left lumbar radiculopathy   . Hyperlipidemia   . Hypertension   . Osteoarthritis      Family History  Problem Relation Age of Onset  . Arthritis Mother   . Diabetes Mother   . Heart disease Mother     MI @ 46  . Kidney disease Father   . Heart disease Father     Rheumatic Heart Disease  . Asthma Sister   . Cancer Brother     brother breast cancer, melanoma  . Stroke Maternal Grandfather     ? age  . Stroke Paternal Grandfather 31     History   Social History  . Marital Status: Widowed    Spouse Name: N/A    Number of Children: N/A  . Years of Education: N/A   Occupational History  . Not on file.   Social History Main Topics  . Smoking status: Former Smoker -- 1.0 packs/day for 57  years    Types: Cigarettes    Quit date: 02/26/2007  . Smokeless tobacco: Not on file     Comment: smoked age 63- 1 up to 1 ppd  . Alcohol Use: No  . Drug Use: No  . Sexually Active: Not on file   Other Topics Concern  . Not on file   Social History Narrative  . No narrative on file     Allergies  Allergen Reactions  . Diclofenac     Hives  . Acetaminophen     Elevated LFTs  . Fluvastatin Sodium     Lescol caused flu symptoms  . Meperidine Hcl     vomiting     Outpatient Prescriptions Prior to Visit  Medication Sig Dispense Refill  . aspirin 81 MG tablet Take 81 mg by mouth daily.        . chlorthalidone (HYGROTON) 25 MG tablet TAKE 1/2 TABLET BY MOUTH EVERY DAY  45 tablet  2  . guaiFENesin (MUCINEX) 600 MG 12 hr tablet Take 1,200 mg by mouth daily.       [name] ipratropium-albuterol (DUONEB) 0.5-2.5 (3) MG/3ML SOLN Take 3 mLs by nebulization every 6 (six) hours as needed.        [name] levothyroxine (SYNTHROID, LEVOTHROID) 25 MCG tablet TAKE ONE TABLET BY MOUTH EVERY DAY, DO NOT TAKE WITHIN 2 HOURS OF OTHER  MEDICATIONS  30 tablet  11  . metoprolol succinate (TOPROL-XL) 25 MG 24 hr tablet TAKE ONE TABLET BY MOUTH DAILY  90 tablet  0  . PARoxetine (PAXIL) 20 MG tablet TAKE 1 TABLET DAILY  30 tablet  4  . tiotropium (SPIRIVA HANDIHALER) 18 MCG inhalation capsule Place 1 capsule (18 mcg total) into inhaler and inhale daily.  30 capsule  12  . [DISCONTINUED] ADVAIR DISKUS 100-50 MCG/DOSE AEPB INHALE 1 PUFF INTO THE LUNGS EVERY 12 (TWELVE) HOURS.  60 each  5  . traMADol (ULTRAM) 50 MG tablet Take 1 tablet (50 mg total) by mouth every 8 (eight) hours as needed for pain.  30 tablet  0   Last reviewed on 02/04/2012  4:26 PM by Storm Frisk, MD    Review of Systems  Constitutional: Negative for fever, chills, diaphoresis, activity change, appetite change, fatigue and unexpected weight change.  HENT: Negative for hearing loss, ear pain, nosebleeds, congestion, sore throat, facial  swelling, rhinorrhea, sneezing, mouth sores, trouble swallowing, neck pain, neck stiffness, dental problem, voice change, postnasal drip, sinus pressure, tinnitus and ear discharge.   Eyes: Negative for photophobia, discharge, itching and visual disturbance.  Respiratory: Positive for wheezing. Negative for apnea, cough, hemoptysis, sputum production, choking, chest tightness, shortness of breath and stridor.   Cardiovascular: Negative for chest pain, palpitations, orthopnea, claudication, leg swelling, syncope and PND.  Gastrointestinal: Negative for nausea, vomiting, abdominal pain, constipation, blood in stool and abdominal distention.  Genitourinary: Negative for dysuria, urgency, frequency, hematuria, flank pain, decreased urine volume and difficulty urinating.  Musculoskeletal: Negative for myalgias, back pain, joint swelling, arthralgias and gait problem.  Skin: Negative for color change, pallor and rash.  Neurological: Negative for dizziness, tremors, seizures, syncope, speech difficulty, weakness, light-headedness, numbness and headaches.  Hematological: Negative for adenopathy. Does not bruise/bleed easily.  Psychiatric/Behavioral: Negative for confusion, sleep disturbance and agitation. The patient is not nervous/anxious.        Objective:   Physical Exam Filed Vitals:   02/04/12 1605  BP: 124/80  Pulse: 68  Temp: 97.5 F (36.4 C)  TempSrc: Oral  Height: 5\' 5"  (1.651 m)  Weight: 162 lb (73.483 kg)  SpO2: 93%    Gen: Pleasant, well-nourished, in no distress,  normal affect  ENT: No lesions,  mouth clear,  oropharynx clear, no postnasal drip  Neck: No JVD, no TMG, no carotid bruits  Lungs: No use of accessory muscles, no dullness to percussion, distant breath sounds  Cardiovascular: RRR, heart sounds normal, no murmur or gallops, no peripheral edema  Abdomen: soft and NT, no HSM,  BS normal  Musculoskeletal: No deformities, no cyanosis or clubbing  Neuro: alert, non  focal  Skin: Warm, no lesions or rashes  No results found.   Spirometry obtained 02/04/2012 reveals severe obstructive defect with FEV1 47% predicted     Assessment & Plan:   COPD without exacerbation golds class C. Gold class C. COPD without current exacerbation Plan This patient Pawley now be cleared for planned ENT procedure Increase Advair to 250 one inhalation twice daily No systemic steroids indicated No need for supplemental oxygen Maintain Spiriva   Updated Medication List Outpatient Encounter Prescriptions as of 02/04/2012  Medication Sig Dispense Refill  . aspirin 81 MG tablet Take 81 mg by mouth daily.        . chlorthalidone (HYGROTON) 25 MG tablet TAKE 1/2 TABLET BY MOUTH EVERY DAY  45 tablet  2  . guaiFENesin (MUCINEX) 600 MG 12 hr tablet  Take 1,200 mg by mouth daily.       [name] ipratropium-albuterol (DUONEB) 0.5-2.5 (3) MG/3ML SOLN Take 3 mLs by nebulization every 6 (six) hours as needed.        [name] levothyroxine (SYNTHROID, LEVOTHROID) 25 MCG tablet TAKE ONE TABLET BY MOUTH EVERY DAY, DO NOT TAKE WITHIN 2 HOURS OF OTHER MEDICATIONS  30 tablet  11  . metoprolol succinate (TOPROL-XL) 25 MG 24 hr tablet TAKE ONE TABLET BY MOUTH DAILY  90 tablet  0  . PARoxetine (PAXIL) 20 MG tablet TAKE 1 TABLET DAILY  30 tablet  4  . tiotropium (SPIRIVA HANDIHALER) 18 MCG inhalation capsule Place 1 capsule (18 mcg total) into inhaler and inhale daily.  30 capsule  12  . [DISCONTINUED] ADVAIR DISKUS 100-50 MCG/DOSE AEPB INHALE 1 PUFF INTO THE LUNGS EVERY 12 (TWELVE) HOURS.  60 each  5  . Fluticasone-Salmeterol (ADVAIR) 250-50 MCG/DOSE AEPB Inhale 1 puff into the lungs 2 (two) times daily.  1 each  6  . traMADol (ULTRAM) 50 MG tablet Take 1 tablet (50 mg total) by mouth every 8 (eight) hours as needed for pain.  30 tablet  0

## 2012-02-09 ENCOUNTER — Institutional Professional Consult (permissible substitution): Payer: Medicare Other | Admitting: Cardiovascular Disease

## 2012-03-02 ENCOUNTER — Telehealth: Payer: Self-pay | Admitting: Internal Medicine

## 2012-03-02 NOTE — Telephone Encounter (Signed)
Patient states she is switching pharmacies.  She is requesting that the office send a new prescription for Hygroton 25mg  1/2 tablet daily. To [facility] [phone]

## 2012-03-03 MED ORDER — CHLORTHALIDONE 25 MG PO TABS: ORAL_TABLET | ORAL | Status: DC

## 2012-03-03 NOTE — Telephone Encounter (Signed)
RX sent

## 2012-03-05 ENCOUNTER — Other Ambulatory Visit: Payer: Self-pay | Admitting: Internal Medicine

## 2012-03-10 ENCOUNTER — Ambulatory Visit (INDEPENDENT_AMBULATORY_CARE_PROVIDER_SITE_OTHER): Payer: Medicare Other | Admitting: Cardiovascular Disease

## 2012-03-10 ENCOUNTER — Encounter: Payer: Self-pay | Admitting: Cardiovascular Disease

## 2012-03-10 VITALS — BP 112/73 | HR 66 | Ht 61.0 in | Wt 167.0 lb

## 2012-03-10 DIAGNOSIS — Z01810 Encounter for preprocedural cardiovascular examination: Secondary | ICD-10-CM

## 2012-03-10 DIAGNOSIS — R0602 Shortness of breath: Secondary | ICD-10-CM

## 2012-03-10 DIAGNOSIS — R9431 Abnormal electrocardiogram [ECG] [EKG]: Secondary | ICD-10-CM

## 2012-03-10 DIAGNOSIS — R0789 Other chest pain: Secondary | ICD-10-CM

## 2012-03-10 NOTE — Assessment & Plan Note (Addendum)
Mrs. Harvie Bridge presents for preoperative evaluation prior to having vocal cord surgery. She has a long history of COPD and chronic shortness breath. She occasionally has some episodes of chest pain/chest pressure. These episodes last about 20 minutes and occur spontaneously. They're not necessarily associated with exertion. She has a abnormal EKG has been unchanged since 2013.  She has a long history of cigarette smoking and is at risk for having some coronary artery disease.  I would like to schedule her for a Lexiscan Myoview study for further evaluation.  Will also get an echocardiogram such as long history of dyspnea. Her cardiac sounds are very distant.  I will see her on an as-needed basis. If the echo or stress test are abnormal then we'll need to see her back for further evaluations.

## 2012-03-10 NOTE — Patient Instructions (Addendum)
Your physician has requested that you have an echocardiogram. Echocardiography is a painless test that uses sound waves to create images of your heart. It provides your doctor with information about the size and shape of your heart and how well your heart's chambers and valves are working. This procedure takes approximately one hour. There are no restrictions for this procedure.  Your physician has requested that you have a lexiscan myoview. Please follow instruction sheet, as given.  Your physician recommends that you schedule a follow-up appointment in: AS NEEDED. WE WILL SEND CARDIAC CLEARANCE NOTE AFTER TEST REUSLTS

## 2012-03-10 NOTE — Progress Notes (Signed)
Olivia [name] Date of Birth  Mar 09, 1931       [facility]    [place] [address]  [address] [place], [place]  [address]   [place], [place]  [address] [phone]     [phone]   Fax  [phone]    Fax [phone]  Problem List: 1. Nodule on vocal cord 2. Mitral valve prolapse 3. Hypertension 4. COPD 5. Chest pain 6. Hypothyroidism  History of Present Illness:  Ms. Urton is a 79 yo with hx of HTN, COPD and occasional CP.   She is hear for pre-op evaluation prior to having vocal cord surgery Brynda Peon, MD).  She has frequent episodes of indigestion - typically when she lies down.  She has occasional episodes of chest pain typically when she last night. Thy last around 20 minutes. They're not associated with exertion.  The pain is described as a sharp pain.  The pains seem to be a steady , sharp pain.  These do not cause any worsening of her dyspnea.  She denies any dizziness, syncope or presyncope.  Current Outpatient Prescriptions on File Prior to Visit  Medication Sig Dispense Refill  . aspirin 81 MG tablet Take 81 mg by mouth daily.        . chlorthalidone (HYGROTON) 25 MG tablet TAKE 1/2 TABLET BY MOUTH EVERY DAY  45 tablet  1  . Fluticasone-Salmeterol (ADVAIR) 250-50 MCG/DOSE AEPB Inhale 1 puff into the lungs 2 (two) times daily.  1 each  6  . guaiFENesin (MUCINEX) 600 MG 12 hr tablet Take 1,200 mg by mouth daily.       [name] ipratropium-albuterol (DUONEB) 0.5-2.5 (3) MG/3ML SOLN Take 3 mLs by nebulization. AS NEEDED      . levothyroxine (SYNTHROID, LEVOTHROID) 25 MCG tablet TAKE ONE TABLET BY MOUTH EVERY DAY, DO NOT TAKE WITHIN 2 HOURS OF OTHER MEDICATIONS  30 tablet  11  . metoprolol succinate (TOPROL-XL) 25 MG 24 hr tablet TAKE ONE TABLET BY MOUTH DAILY  90 tablet  0  . PARoxetine (PAXIL) 20 MG tablet TAKE 1 TABLET DAILY  30 tablet  4  . tiotropium (SPIRIVA HANDIHALER) 18 MCG inhalation capsule Place 1 capsule (18 mcg  total) into inhaler and inhale daily.  30 capsule  12  . traMADol (ULTRAM) 50 MG tablet Take 1 tablet (50 mg total) by mouth every 8 (eight) hours as needed for pain.  30 tablet  0    Allergies  Allergen Reactions  . Diclofenac     Hives  . Acetaminophen     Elevated LFTs  . Fluvastatin Sodium     Lescol caused flu symptoms  . Meperidine Hcl     vomiting    Past Medical History  Diagnosis Date  . COPD (chronic obstructive pulmonary disease)   . Myopathy   . Left lumbar radiculopathy   . Hyperlipidemia   . Hypertension   . Osteoarthritis     Past Surgical History  Procedure Date  . Uterine ablation     Dr Nicholas Lose  . Dilation and curettage of uterus   . Glaucoma surgery   . Colonoscopy with polypectomy   . Squamous cell cancer  resection     rectal; Dr Byrd Hesselbach , [facility]  . Cataract extraction, bilateral     History  Smoking status  . Former Smoker -- 1.0 packs/day for 57 years  . Types: Cigarettes  . Quit date: 02/26/2007  Smokeless tobacco  .  Not on file    Comment: smoked age 34- 30 up to 1 ppd    History  Alcohol Use No    Family History  Problem Relation Age of Onset  . Arthritis Mother   . Diabetes Mother   . Heart disease Mother     MI @ 95  . Kidney disease Father   . Heart disease Father     Rheumatic Heart Disease  . Asthma Sister   . Cancer Brother     brother breast cancer, melanoma  . Stroke Maternal Grandfather     ? age  . Stroke Paternal Grandfather 68    Reviw of Systems:  Reviewed in the HPI.  All other systems are negative.  Physical Exam: Blood pressure 112/73, pulse 66, height 5\' 1"  (1.549 m), weight 167 lb (75.751 kg), SpO2 95.00%. General: Well developed, well nourished, in no acute distress.  Head: Normocephalic, atraumatic, sclera non-icteric, mucus membranes are moist,   Neck: Supple. Carotids are 2 + without bruits. No JVD   Lungs: Clear   Heart: distant heart sounds, normal S1, S2  Abdomen: Soft, non-tender,  non-distended with normal bowel sounds.  Msk:  Strength and tone are normal   Extremities: No clubbing or cyanosis. No edema.  Distal pedal pulses are 2+ and equal    Neuro: CN II - XII intact.  Alert and oriented X 3.   Psych:  Normal   ECG: March 10, 2012:  NSR at 34,  She has occasional PVCs.  She has TWI  V1 - V5. [name]  EKG is unchanged from her previous tracing in 2013.  Assessment / Plan:

## 2012-03-10 NOTE — Assessment & Plan Note (Signed)
She has normal sinus rhythm. She has T-wave inversions from V1 through V5. Her EKG is not changed since her previous EKG in 2013.

## 2012-03-15 ENCOUNTER — Encounter: Payer: Self-pay | Admitting: Internal Medicine

## 2012-03-15 ENCOUNTER — Other Ambulatory Visit (HOSPITAL_COMMUNITY): Payer: Medicare Other

## 2012-03-16 ENCOUNTER — Encounter (HOSPITAL_COMMUNITY): Payer: Medicare Other

## 2012-03-21 ENCOUNTER — Other Ambulatory Visit (HOSPITAL_COMMUNITY): Payer: Medicare Other

## 2012-03-23 ENCOUNTER — Encounter (HOSPITAL_COMMUNITY): Payer: Medicare Other

## 2012-03-28 ENCOUNTER — Ambulatory Visit (HOSPITAL_COMMUNITY): Payer: Medicare Other | Attending: Cardiovascular Disease | Admitting: Radiology

## 2012-03-28 DIAGNOSIS — R0609 Other forms of dyspnea: Secondary | ICD-10-CM | POA: Insufficient documentation

## 2012-03-28 DIAGNOSIS — R0989 Other specified symptoms and signs involving the circulatory and respiratory systems: Secondary | ICD-10-CM | POA: Insufficient documentation

## 2012-03-28 DIAGNOSIS — J449 Chronic obstructive pulmonary disease, unspecified: Secondary | ICD-10-CM | POA: Insufficient documentation

## 2012-03-28 DIAGNOSIS — Z01810 Encounter for preprocedural cardiovascular examination: Secondary | ICD-10-CM

## 2012-03-28 DIAGNOSIS — R0602 Shortness of breath: Secondary | ICD-10-CM

## 2012-03-28 DIAGNOSIS — R9431 Abnormal electrocardiogram [ECG] [EKG]: Secondary | ICD-10-CM

## 2012-03-28 DIAGNOSIS — E785 Hyperlipidemia, unspecified: Secondary | ICD-10-CM | POA: Insufficient documentation

## 2012-03-28 DIAGNOSIS — J4489 Other specified chronic obstructive pulmonary disease: Secondary | ICD-10-CM | POA: Insufficient documentation

## 2012-03-28 DIAGNOSIS — I319 Disease of pericardium, unspecified: Secondary | ICD-10-CM | POA: Insufficient documentation

## 2012-03-28 DIAGNOSIS — I10 Essential (primary) hypertension: Secondary | ICD-10-CM | POA: Insufficient documentation

## 2012-03-28 NOTE — Progress Notes (Signed)
Echocardiogram performed.  

## 2012-03-30 ENCOUNTER — Ambulatory Visit (HOSPITAL_COMMUNITY): Payer: Medicare Other | Attending: Cardiology | Admitting: Radiology

## 2012-03-30 VITALS — BP 112/73 | Ht 62.0 in | Wt 165.0 lb

## 2012-03-30 DIAGNOSIS — R0789 Other chest pain: Secondary | ICD-10-CM | POA: Insufficient documentation

## 2012-03-30 DIAGNOSIS — R9431 Abnormal electrocardiogram [ECG] [EKG]: Secondary | ICD-10-CM

## 2012-03-30 DIAGNOSIS — R079 Chest pain, unspecified: Secondary | ICD-10-CM

## 2012-03-30 DIAGNOSIS — R0602 Shortness of breath: Secondary | ICD-10-CM

## 2012-03-30 DIAGNOSIS — R42 Dizziness and giddiness: Secondary | ICD-10-CM | POA: Insufficient documentation

## 2012-03-30 DIAGNOSIS — Z01810 Encounter for preprocedural cardiovascular examination: Secondary | ICD-10-CM

## 2012-03-30 DIAGNOSIS — I10 Essential (primary) hypertension: Secondary | ICD-10-CM | POA: Insufficient documentation

## 2012-03-30 DIAGNOSIS — R0609 Other forms of dyspnea: Secondary | ICD-10-CM | POA: Insufficient documentation

## 2012-03-30 DIAGNOSIS — R0989 Other specified symptoms and signs involving the circulatory and respiratory systems: Secondary | ICD-10-CM | POA: Insufficient documentation

## 2012-03-30 DIAGNOSIS — J438 Other emphysema: Secondary | ICD-10-CM | POA: Insufficient documentation

## 2012-03-30 MED ORDER — REGADENOSON 0.4 MG/5ML IV SOLN
0.4000 mg | Freq: Once | INTRAVENOUS | Status: AC
  Administered 2012-03-30: 0.4 mg via INTRAVENOUS

## 2012-03-30 MED ORDER — TECHNETIUM TC 99M SESTAMIBI GENERIC - CARDIOLITE
33.0000 | Freq: Once | INTRAVENOUS | Status: AC | PRN
  Administered 2012-03-30: 33 via INTRAVENOUS

## 2012-03-30 MED ORDER — TECHNETIUM TC 99M SESTAMIBI GENERIC - CARDIOLITE
11.0000 | Freq: Once | INTRAVENOUS | Status: AC | PRN
  Administered 2012-03-30: 11 via INTRAVENOUS

## 2012-03-30 NOTE — Progress Notes (Signed)
[facility] SITE 3 NUCLEAR MED [address] [place], [place] [address] [phone]    Cardiology Nuclear Med Study  Olivia [name] is a 79 y.o. female     MRN : [id number]     DOB: December 22, 1931  Procedure Date: 03/30/2012  Nuclear Med Background Indication for Stress Test:  Evaluation for Ischemia and Pending Surgical Clearance: Vocal cord surgery- Dr. Serena Colonel History: Asthma,COPD,Emphysema, No prior known history of CAD, and '03 Echo: EF=55-65%, and '03 Myocardial Perfusion Study-no ischemia, apical thinning, EF=53% Cardiac Risk Factors: Family History - CAD, History of Smoking, Hypertension and Lipids  Symptoms: Chest Pain/Pressure with/without exertion (last occurrence one month ago),  Dizziness, DOE, Light-Headedness, Rapid HR and SOB   Nuclear Pre-Procedure Caffeine/Decaff Intake:  None NPO After: 12:00am   Lungs:  clear O2 Sat: 96% on room air. IV 0.9% NS with Angio Cath:  20g  IV Site: R Antecubital  IV Started by:  Stanton Kidney, EMT-P  Chest Size (in):  38 Cup Size: D  Height: 5\' 2"  (1.575 m)  Weight:  165 lb (74.844 kg)  BMI:  Body mass index is 30.18 kg/(m^2). Tech Comments:  Meds as directed    Nuclear Med Study 1 or 2 day study: 1 day  Stress Test Type:  Lexiscan  Reading MD: Cassell Clement, MD  Order Authorizing Provider:  Kristeen Miss, MD  Resting Radionuclide: Technetium 24m Sestamibi  Resting Radionuclide Dose: 11.0 mCi   Stress Radionuclide:  Technetium 36m Sestamibi  Stress Radionuclide Dose: 33.0 mCi           Stress Protocol Rest HR: 58 Stress HR: 88  Rest BP: 112/73 Stress BP: 142/74  Exercise Time (min): n/a METS: n/a   Predicted Max HR: 142 bpm % Max HR: 61.97 bpm Rate Pressure Product: 21308    Dose of Adenosine (mg):  n/a Dose of Lexiscan: 0.4 mg  Dose of Atropine (mg): n/a Dose of Dobutamine: n/a mcg/kg/min (at max HR)  Stress Test Technologist: Irean Hong, RN  Nuclear Technologist:  Domenic Polite, CNMT     Rest  Procedure:  Myocardial perfusion imaging was performed at rest 45 minutes following the intravenous administration of Technetium 83m Sestamibi. Rest ECG: NSR with non-specific ST-T wave changes  Stress Procedure:  The patient received IV Lexiscan 0.4 mg over 15-seconds.  Technetium 64m Sestamibi injected at 30-seconds.  Quantitative spect images were obtained after a 45 minute delay. Stress ECG: No significant change from baseline ECG  QPS Raw Data Images:  Normal; no motion artifact; normal heart/lung ratio. Stress Images:  Normal homogeneous uptake in all areas of the myocardium. Rest Images:  Normal homogeneous uptake in all areas of the myocardium. Subtraction (SDS):  No evidence of ischemia. Transient Ischemic Dilatation (Normal <1.22):  0.91 Lung/Heart Ratio (Normal <0.45):  0.36  Quantitative Gated Spect Images QGS EDV:  52 ml QGS ESV:  11 ml  Impression Exercise Capacity:  Lexiscan with no exercise. BP Response:  Normal blood pressure response. Clinical Symptoms:  No significant symptoms noted. ECG Impression:  No significant ST segment change suggestive of ischemia. Comparison with Prior Nuclear Study: No images to compare  Overall Impression:  Normal stress nuclear study.  LV Ejection Fraction: 79%.  LV Wall Motion:  NL LV Function; NL Wall Motion  [employer]

## 2012-04-03 ENCOUNTER — Telehealth: Payer: Self-pay | Admitting: Cardiovascular Disease

## 2012-04-03 NOTE — Telephone Encounter (Signed)
New Problem:    Patient called in wanting to know what the results form her latest ECHO was.  Please call back.

## 2012-04-03 NOTE — Telephone Encounter (Signed)
PT AND PT'SDAUGHTER AWARE  ECHO WAS NORMAL PER DR Elease Hashimoto .Zack Seal

## 2012-04-08 ENCOUNTER — Other Ambulatory Visit: Payer: Self-pay

## 2012-04-20 ENCOUNTER — Encounter (HOSPITAL_COMMUNITY): Payer: Self-pay | Admitting: Pharmacy Technician

## 2012-04-20 NOTE — H&P (Signed)
Assessment   Hoarseness (784.42) (R49.0).  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 Discussed  Chronic hoarseness secondary to a very small lesion of the anterior/midportion right vocal cord. Grossly, it does not appear malignant. The only way to know for sure is going to be with biopsy under microlaryngoscopy. This required general anesthesia. We will need cardiac and pulmonary clearance preop. Reason For Visit  Hoarseness. HPI  She's had hoarseness for at least a year. It is intermittently worse. She denies sore throat or trouble swallowing. She is a former smoker. Allergies  Demerol TABS Diclofenac Sodium TBEC Hydrocodone-Acetaminophen TABS Lescol XL TB24. Current Meds  Mucinex TB12;; RPT Spiriva HandiHaler CAPS;; RPT Levothyroxine Sodium TABS;; RPT Advair Diskus AEPB;; RPT Aspirin EC 81 MG Oral Tablet Delayed Release;; RPT Metoprolol Tartrate TABS;; RPT Chlorthalidone TABS;; RPT PARoxetine HCl TABS;; RPT. Active Problems  Abnormal auditory perception, unspecified laterality  (388.40) (H93.299) Arthritis  (716.90) (M19.90) Asthma  (493.90) (J45.909) Cardiac dysrhythmia  (427.9) (I49.9) Hearing loss  (389.9) (H91.90) Heartburn  (787.1) (R12) Osteoporosis  (733.00) (M81.0) Other emphysema  (492.8) (J43.8) Presbycusis  (388.01) (H91.10) Pure hypercholesterolemia  (272.0) (E78.0). 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 Family Hx  Cirrhosis Coronary Artery Disease (V17.49) Family history of diabetes mellitus: Mother (V18.0) (Z83.3) Hypertension (V17.49) Rheumatoid Arthritis. Personal Hx  Former smoker [phone]) [phone]); quit 2009 Never Drank Alcohol. Physical Exam  Nasal exam clear. No palpable neck masses. Oral cavity and pharynx clear. Indirect laryngoscopy inadequate. Procedure  Fiberoptic Laryngoscopy Name: Olivia [name]     Age: 79 year   Performing Provider: Serena Colonel The risks of the procedure are minimal and were  discussed with the patient today. Pre-op Diagnosis: hoarseness  Post-op Diagnosis:Same Allergy:  reviewed allergies as listed Nasal Prep:Lidocaine/Afrin   Procedure:     With the patient seated in the exam chair, the R nasal cavity was intubated with the flexible laryngoscope.  The nasal cavity mucosa, nasopharynx, hypopharynx and larynx were all examined with findings as noted.  The scope was then removed.  The patient tolerated the procedure well without complication or blood loss (unless indicated in findings).   FINDINGS: Nasopharynx, oropharynx and hypopharynx clear. Vocal cords with normal mobility. Supraglottic and subglottic larynx normal. On the midportion of the right membranous vocal fold, there is a small raised whitish lesion. . Signature  Electronically signed by : Serena Colonel  M.D.; 01/14/2012 4:20 PM EST.

## 2012-04-24 ENCOUNTER — Ambulatory Visit (HOSPITAL_COMMUNITY)
Admission: RE | Admit: 2012-04-24 | Discharge: 2012-04-24 | Disposition: A | Discharge status: Home / Self Care | Payer: Medicare Other | Source: Ambulatory Visit | Attending: Anesthesiology | Admitting: Anesthesiology

## 2012-04-24 ENCOUNTER — Encounter (HOSPITAL_COMMUNITY): Payer: Self-pay

## 2012-04-24 ENCOUNTER — Encounter (HOSPITAL_COMMUNITY)
Admission: RE | Admit: 2012-04-24 | Discharge: 2012-04-24 | Disposition: A | Discharge status: Home / Self Care | Payer: Medicare Other | Source: Ambulatory Visit | Attending: Otolaryngology | Admitting: Otolaryngology

## 2012-04-24 DIAGNOSIS — J4489 Other specified chronic obstructive pulmonary disease: Secondary | ICD-10-CM | POA: Insufficient documentation

## 2012-04-24 DIAGNOSIS — I700 Atherosclerosis of aorta: Secondary | ICD-10-CM | POA: Insufficient documentation

## 2012-04-24 DIAGNOSIS — I517 Cardiomegaly: Secondary | ICD-10-CM | POA: Insufficient documentation

## 2012-04-24 DIAGNOSIS — J383 Other diseases of vocal cords: Secondary | ICD-10-CM | POA: Insufficient documentation

## 2012-04-24 DIAGNOSIS — Z01812 Encounter for preprocedural laboratory examination: Secondary | ICD-10-CM | POA: Insufficient documentation

## 2012-04-24 DIAGNOSIS — I10 Essential (primary) hypertension: Secondary | ICD-10-CM | POA: Insufficient documentation

## 2012-04-24 DIAGNOSIS — J984 Other disorders of lung: Secondary | ICD-10-CM | POA: Insufficient documentation

## 2012-04-24 HISTORY — DX: Pneumonia, unspecified organism: J18.9

## 2012-04-24 HISTORY — DX: Shortness of breath: R06.02

## 2012-04-24 HISTORY — DX: Anxiety disorder, unspecified: F41.9

## 2012-04-24 HISTORY — DX: Malignant (primary) neoplasm, unspecified: C80.1

## 2012-04-24 HISTORY — DX: Cardiac murmur, unspecified: R01.1

## 2012-04-24 HISTORY — DX: Gastro-esophageal reflux disease without esophagitis: K21.9

## 2012-04-24 HISTORY — DX: Personal history of urinary (tract) infections: Z87.440

## 2012-04-24 LAB — CBC
HCT: 38.9 % (ref 36.0–46.0)
MCH: 31.7 pg (ref 26.0–34.0)
MCHC: 33.7 g/dL (ref 30.0–36.0)
MCV: 94.2 fL (ref 78.0–100.0)
RDW: 14.1 % (ref 11.5–15.5)

## 2012-04-24 LAB — BASIC METABOLIC PANEL
BUN: 23 mg/dL (ref 6–23)
CO2: 29 mEq/L (ref 19–32)
Chloride: 100 mEq/L (ref 96–112)
Creatinine, Ser: 1.26 mg/dL — ABNORMAL HIGH (ref 0.50–1.10)
GFR calc Af Amer: 46 mL/min — ABNORMAL LOW (ref >90)
Glucose, Bld: 96 mg/dL (ref 70–99)

## 2012-04-24 NOTE — Pre-Procedure Instructions (Addendum)
Jamaiya Tunnell Petterson  04/24/2012   Your procedure is scheduled on:  04/26/12  Report to [facility] at 830AM.  Call this number if you have problems the morning of surgery: [phone]   Remember:   Do not eat food or drink liquids after midnight.   Take these medicines the morning of surgery with A SIP OF WATER:, synthroid, pain med inhalers STOP aspirin now   Do not wear jewelry, make-up or nail polish.  Do not wear lotions, powders, or perfumes. You Fiala wear deodorant.  Do not shave 48 hours prior to surgery. Men Gulino shave face and neck.  Do not bring valuables to the hospital.  Contacts, dentures or bridgework Feutz not be worn into surgery.  Leave suitcase in the car. After surgery it Heidel be brought to your room.  For patients admitted to the hospital, checkout time is 11:00 AM the day of  discharge.   Patients discharged the day of surgery will not be allowed to drive  home.  Name and phone number of your driver:   Special Instructions: Shower using CHG 2 nights before surgery and the night before surgery.  If you shower the day of surgery use CHG.  Use special wash - you have one bottle of CHG for all showers.  You should use approximately 1/3 of the bottle for each shower.   Please read over the following fact sheets that you were given: Pain Booklet, Coughing and Deep Breathing and Surgical Site Infection Prevention

## 2012-04-26 ENCOUNTER — Ambulatory Visit (HOSPITAL_COMMUNITY)
Admission: RE | Admit: 2012-04-26 | Discharge: 2012-04-26 | Disposition: A | Discharge status: Home / Self Care | Payer: Medicare Other | Source: Ambulatory Visit | Attending: Otolaryngology | Admitting: Otolaryngology

## 2012-04-26 ENCOUNTER — Encounter (HOSPITAL_COMMUNITY): Payer: Self-pay | Admitting: *Deleted

## 2012-04-26 ENCOUNTER — Encounter (HOSPITAL_COMMUNITY)
Admission: RE | Disposition: A | Discharge status: Home / Self Care | Payer: Self-pay | Source: Ambulatory Visit | Attending: Otolaryngology

## 2012-04-26 ENCOUNTER — Encounter (HOSPITAL_COMMUNITY): Payer: Self-pay | Admitting: Anesthesiology

## 2012-04-26 ENCOUNTER — Ambulatory Visit (HOSPITAL_COMMUNITY): Payer: Medicare Other | Admitting: Anesthesiology

## 2012-04-26 DIAGNOSIS — I10 Essential (primary) hypertension: Secondary | ICD-10-CM | POA: Insufficient documentation

## 2012-04-26 DIAGNOSIS — K219 Gastro-esophageal reflux disease without esophagitis: Secondary | ICD-10-CM | POA: Insufficient documentation

## 2012-04-26 DIAGNOSIS — J438 Other emphysema: Secondary | ICD-10-CM | POA: Insufficient documentation

## 2012-04-26 DIAGNOSIS — Z87891 Personal history of nicotine dependence: Secondary | ICD-10-CM | POA: Insufficient documentation

## 2012-04-26 DIAGNOSIS — G709 Myoneural disorder, unspecified: Secondary | ICD-10-CM | POA: Insufficient documentation

## 2012-04-26 DIAGNOSIS — I739 Peripheral vascular disease, unspecified: Secondary | ICD-10-CM | POA: Insufficient documentation

## 2012-04-26 DIAGNOSIS — E7800 Pure hypercholesterolemia, unspecified: Secondary | ICD-10-CM | POA: Insufficient documentation

## 2012-04-26 DIAGNOSIS — J383 Other diseases of vocal cords: Secondary | ICD-10-CM | POA: Insufficient documentation

## 2012-04-26 DIAGNOSIS — E039 Hypothyroidism, unspecified: Secondary | ICD-10-CM | POA: Insufficient documentation

## 2012-04-26 DIAGNOSIS — IMO0002 Reserved for concepts with insufficient information to code with codable children: Secondary | ICD-10-CM | POA: Insufficient documentation

## 2012-04-26 DIAGNOSIS — Z79899 Other long term (current) drug therapy: Secondary | ICD-10-CM | POA: Insufficient documentation

## 2012-04-26 DIAGNOSIS — F411 Generalized anxiety disorder: Secondary | ICD-10-CM | POA: Insufficient documentation

## 2012-04-26 DIAGNOSIS — J4489 Other specified chronic obstructive pulmonary disease: Secondary | ICD-10-CM | POA: Insufficient documentation

## 2012-04-26 DIAGNOSIS — D380 Neoplasm of uncertain behavior of larynx: Secondary | ICD-10-CM

## 2012-04-26 HISTORY — PX: MICROLARYNGOSCOPY: SHX5208

## 2012-04-26 SURGERY — MICROLARYNGOSCOPY
Anesthesia: General | Site: Mouth | Laterality: Right | Wound class: Clean Contaminated

## 2012-04-26 MED ORDER — EPINEPHRINE HCL (NASAL) 0.1 % NA SOLN
NASAL | Status: AC
  Filled 2012-04-26: qty 30

## 2012-04-26 MED ORDER — HYDROMORPHONE HCL PF 1 MG/ML IJ SOLN: 0.2500 mg | INTRAMUSCULAR | Status: DC | PRN

## 2012-04-26 MED ORDER — OXYCODONE HCL 5 MG/5ML PO SOLN
5.0000 mg | Freq: Once | ORAL | Status: DC | PRN
Start: 2012-04-26 — End: 2012-04-26

## 2012-04-26 MED ORDER — ROCURONIUM BROMIDE 100 MG/10ML IV SOLN
INTRAVENOUS | Status: DC | PRN
  Administered 2012-04-26: 30 mg via INTRAVENOUS

## 2012-04-26 MED ORDER — PROPOFOL 10 MG/ML IV BOLUS
INTRAVENOUS | Status: DC | PRN
  Administered 2012-04-26: 100 mg via INTRAVENOUS

## 2012-04-26 MED ORDER — OXYCODONE HCL 5 MG PO TABS: 5.0000 mg | ORAL_TABLET | Freq: Once | ORAL | Status: DC | PRN

## 2012-04-26 MED ORDER — FENTANYL CITRATE 0.05 MG/ML IJ SOLN
INTRAMUSCULAR | Status: DC | PRN
  Administered 2012-04-26: 100 ug via INTRAVENOUS

## 2012-04-26 MED ORDER — LIDOCAINE-EPINEPHRINE 1 %-1:100000 IJ SOLN
INTRAMUSCULAR | Status: AC
  Filled 2012-04-26: qty 1

## 2012-04-26 MED ORDER — LIDOCAINE HCL (CARDIAC) 20 MG/ML IV SOLN
INTRAVENOUS | Status: DC | PRN
  Administered 2012-04-26: 100 mg via INTRAVENOUS

## 2012-04-26 MED ORDER — LIDOCAINE-EPINEPHRINE 1 %-1:100000 IJ SOLN
INTRAMUSCULAR | Status: DC | PRN
  Administered 2012-04-26: 10 mL

## 2012-04-26 MED ORDER — LACTATED RINGERS IV SOLN
INTRAVENOUS | Status: DC
  Administered 2012-04-26: 10:00:00 via INTRAVENOUS

## 2012-04-26 MED ORDER — EPINEPHRINE HCL (NASAL) 0.1 % NA SOLN
NASAL | Status: DC | PRN
  Administered 2012-04-26: 1 [drp] via TOPICAL

## 2012-04-26 MED ORDER — PROMETHAZINE HCL 25 MG/ML IJ SOLN: 6.2500 mg | INTRAMUSCULAR | Status: DC | PRN

## 2012-04-26 MED ORDER — TRIAMCINOLONE ACETONIDE 40 MG/ML IJ SUSP
INTRAMUSCULAR | Status: AC
  Filled 2012-04-26: qty 5

## 2012-04-26 MED ORDER — ONDANSETRON HCL 4 MG/2ML IJ SOLN
INTRAMUSCULAR | Status: DC | PRN
  Administered 2012-04-26: 4 mg via INTRAVENOUS

## 2012-04-26 SURGICAL SUPPLY — 22 items
CANISTER SUCTION 2500CC (MISCELLANEOUS) ×2 IMPLANT
CLOTH BEACON ORANGE TIMEOUT ST (SAFETY) ×2 IMPLANT
CONT SPEC 4OZ CLIKSEAL STRL BL (MISCELLANEOUS) ×4 IMPLANT
COVER TABLE BACK 60X90 (DRAPES) ×2 IMPLANT
DRAPE PROXIMA HALF (DRAPES) ×2 IMPLANT
GLOVE BIO SURGEON STRL SZ7 (GLOVE) ×1 IMPLANT
GLOVE ECLIPSE 7.5 STRL STRAW (GLOVE) ×2 IMPLANT
GUARD TEETH (MISCELLANEOUS) ×1 IMPLANT
KIT BASIN OR (CUSTOM PROCEDURE TRAY) ×2 IMPLANT
KIT ROOM TURNOVER OR (KITS) ×2 IMPLANT
NDL SPNL 20GX3.5 QUINCKE YW (NEEDLE) IMPLANT
NEEDLE SPNL 20GX3.5 QUINCKE YW (NEEDLE) ×2 IMPLANT
NS IRRIG 1000ML POUR BTL (IV SOLUTION) ×2 IMPLANT
PAD ARMBOARD 7.5X6 YLW CONV (MISCELLANEOUS) ×3 IMPLANT
PATTIES SURGICAL .5 X1 (DISPOSABLE) ×1 IMPLANT
SOLUTION ANTI FOG 6CC (MISCELLANEOUS) ×2 IMPLANT
SPECIMEN JAR SMALL (MISCELLANEOUS) ×2 IMPLANT
SPONGE GAUZE 4X4 12PLY (GAUZE/BANDAGES/DRESSINGS) ×2 IMPLANT
SYR TB 1ML LUER SLIP (SYRINGE) ×1 IMPLANT
TOWEL OR 17X24 6PK STRL BLUE (TOWEL DISPOSABLE) ×4 IMPLANT
TUBE CONNECTING 12X1/4 (SUCTIONS) ×2 IMPLANT
WATER STERILE IRR 1000ML POUR (IV SOLUTION) ×2 IMPLANT

## 2012-04-26 NOTE — Preoperative (Signed)
Beta Blockers   Reason not to administer Beta Blockers:took toprol yesterday at 2000pm

## 2012-04-26 NOTE — Op Note (Signed)
OPERATIVE REPORT  DATE OF SURGERY: 04/26/2012  PATIENT:  Olivia [name],  80 y.o. female  PRE-OPERATIVE DIAGNOSIS:  Right Vocal Cord Lesion  POST-OPERATIVE DIAGNOSIS:  Right Vocal Cord Lesion  PROCEDURE:  Procedure(s): MICROLARYNGOSCOPY WITH EXCISION OF VOCAL CORD LESION  SURGEON:  Susy Frizzle, MD  ASSISTANTS: None  ANESTHESIA:   General   EBL:  1 ml  DRAINS: None   LOCAL MEDICATIONS USED:  Xylocaine with epinephrine injection of the vocal cord, topical adrenaline  SPECIMEN:  Right vocal cord lesion  COUNTS:  Correct  PROCEDURE DETAILS: The patient was taken to the operating room and placed on the operating table in the supine position. Following induction of general endotracheal anesthesia, a maxillary tooth protector was placed. The head was draped. A Dedo laryngoscope was used to view the larynx, and attached to the Mayo stand with the suspension apparatus. The microscope was used for the remainder of the procedure. Photograph was taken using telescopic rod. The right cord was injected with local anesthetic solution. Microlaryngoscopy scissors were used to dissected the lesion off in its entirety, preserving the vocal ligament. The specimen was sent for pathologic evaluation. It was limited to the contacting surface of the right membranous vocal fold and had a white papillomatosis appearance. Topical adrenaline was used on pledgets for hemostasis. The scope was removed and the patient was awakened, extubated and transferred to recovery in stable condition.    PATIENT DISPOSITION:  To PACU, stable

## 2012-04-26 NOTE — Anesthesia Preprocedure Evaluation (Addendum)
Anesthesia Evaluation  Patient identified by MRN, date of birth, ID band Patient awake    Reviewed: Allergy & Precautions, H&P , NPO status , Patient's Chart, lab work & pertinent test results  History of Anesthesia Complications Negative for: history of anesthetic complications  Airway Mallampati: II TM Distance: >3 FB Neck ROM: Full    Dental  (+) Chipped, Missing, Poor Dentition and Dental Advisory Given   Pulmonary shortness of breath, pneumonia -, resolved, COPD COPD inhaler,  breath sounds clear to auscultation  Pulmonary exam normal       Cardiovascular hypertension, Pt. on home beta blockers and Pt. on medications + Peripheral Vascular Disease - Valvular Problems/MurmursRhythm:Regular Rate:Normal  2/14 ECHO: normal LVF, EF 50-55%, valves OK 1/14 myoview: normal wall motion, no ischemia, EF 79%   Neuro/Psych PSYCHIATRIC DISORDERS Anxiety  Neuromuscular disease (generalized myopathy, doesn't walk well) negative neurological ROS     GI/Hepatic Neg liver ROS, GERD-  Medicated and Poorly Controlled,  Endo/Other  Hypothyroidism Morbid obesity  Renal/GU negative Renal ROS     Musculoskeletal   Abdominal (+) + obese,   Peds  Hematology negative hematology ROS (+)   Anesthesia Other Findings   Reproductive/Obstetrics                         Anesthesia Physical Anesthesia Plan  ASA: III  Anesthesia Plan: General   Post-op Pain Management:    Induction: Intravenous and Rapid sequence  Airway Management Planned: Oral ETT  Additional Equipment:   Intra-op Plan:   Post-operative Plan: Extubation in OR  Informed Consent: I have reviewed the patients History and Physical, chart, labs and discussed the procedure including the risks, benefits and alternatives for the proposed anesthesia with the patient or authorized representative who has indicated his/her understanding and acceptance.    Dental advisory given  Plan Discussed with: CRNA and Surgeon  Anesthesia Plan Comments: (Plan routine monitors, GETA )       Anesthesia Quick Evaluation

## 2012-04-26 NOTE — Progress Notes (Signed)
Giving lunch relief for Olivia [name]. --A.[employer]

## 2012-04-26 NOTE — Interval H&P Note (Signed)
History and Physical Interval Note:  04/26/2012 11:13 AM  Olivia [name]  has presented today for surgery, with the diagnosis of Right Vocal Cord Lesion  The various methods of treatment have been discussed with the patient and family. After consideration of risks, benefits and other options for treatment, the patient has consented to  Procedure(s): MICROLARYNGOSCOPY WITH EXCISION OF VOCAL CORD LESION (Right) as a surgical intervention .  The patient's history has been reviewed, patient examined, no change in status, stable for surgery.  I have reviewed the patient's chart and labs.  Questions were answered to the patient's satisfaction.     ROSEN, JEFRY

## 2012-04-26 NOTE — Transfer of Care (Signed)
Immediate Anesthesia Transfer of Care Note  Patient: Olivia [name]  Procedure(s) Performed: Procedure(s): MICROLARYNGOSCOPY WITH EXCISION OF VOCAL CORD LESION (Right)  Patient Location: PACU  Anesthesia Type:General  Level of Consciousness: awake, alert , oriented and patient cooperative  Airway & Oxygen Therapy: Patient Spontanous Breathing and Patient connected to nasal cannula oxygen  Post-op Assessment: Report given to PACU RN, Post -op Vital signs reviewed and stable and Patient moving all extremities  Post vital signs: Reviewed and stable  Complications: No apparent anesthesia complications

## 2012-04-26 NOTE — Progress Notes (Signed)
[place]  Spoke with Dr. Pollyann Kennedy.....Marland KitchenMarland KitchenNo antibiotic needed.  DA

## 2012-04-26 NOTE — Anesthesia Postprocedure Evaluation (Signed)
  Anesthesia Post-op Note  Patient: Olivia [name]  Procedure(s) Performed: Procedure(s): MICROLARYNGOSCOPY WITH EXCISION OF VOCAL CORD LESION (Right)  Patient Location: PACU  Anesthesia Type:General  Level of Consciousness: awake, alert , oriented and patient cooperative  Airway and Oxygen Therapy: Patient Spontanous Breathing  Post-op Pain: none  Post-op Assessment: Post-op Vital signs reviewed, Patient's Cardiovascular Status Stable, Respiratory Function Stable, Patent Airway, No signs of Nausea or vomiting and Pain level controlled  Post-op Vital Signs: Reviewed and stable  Complications: No apparent anesthesia complications

## 2012-04-27 ENCOUNTER — Encounter (HOSPITAL_COMMUNITY): Payer: Self-pay | Admitting: Otolaryngology

## 2012-04-27 NOTE — Progress Notes (Signed)
Spoke with patient's daughter who is caring for the patient. States her B/P has been low today ( 90/40) and heart rate is 48.Patient c/o eing light headed but states that is not new. Pt advised to hold Metoprolol and re check BP and pulse in the morning ; if still low call primary MD, Dr Alwyn Ren  for further instructions.

## 2012-05-03 ENCOUNTER — Telehealth: Payer: Self-pay | Admitting: Internal Medicine

## 2012-05-03 MED ORDER — METOPROLOL SUCCINATE ER 25 MG PO TB24: 25.0000 mg | ORAL_TABLET | Freq: Every day | ORAL | Status: DC

## 2012-05-03 NOTE — Telephone Encounter (Signed)
Refill: Metoprolol succ er 25 mg tab. Take one tablet by mouth daily. Qty 90. Last fill 02-03-12

## 2012-05-03 NOTE — Telephone Encounter (Signed)
RX sent electronically 

## 2012-08-08 ENCOUNTER — Other Ambulatory Visit: Payer: Self-pay | Admitting: Internal Medicine

## 2012-09-14 ENCOUNTER — Other Ambulatory Visit: Payer: Self-pay | Admitting: Internal Medicine

## 2012-10-13 ENCOUNTER — Other Ambulatory Visit: Payer: Self-pay | Admitting: Internal Medicine

## 2012-10-31 ENCOUNTER — Other Ambulatory Visit: Payer: Self-pay | Admitting: Internal Medicine

## 2012-11-03 ENCOUNTER — Other Ambulatory Visit: Payer: Self-pay | Admitting: Internal Medicine

## 2012-11-06 NOTE — Telephone Encounter (Signed)
Med filled letter mailed to pt to notify of need for appt.

## 2012-12-05 ENCOUNTER — Encounter: Payer: Self-pay | Admitting: Lab

## 2012-12-06 ENCOUNTER — Ambulatory Visit: Payer: Medicare Other | Admitting: Internal Medicine

## 2012-12-08 ENCOUNTER — Other Ambulatory Visit: Payer: Self-pay | Admitting: Critical Care Medicine

## 2012-12-08 ENCOUNTER — Encounter: Payer: Self-pay | Admitting: Internal Medicine

## 2012-12-08 ENCOUNTER — Ambulatory Visit (INDEPENDENT_AMBULATORY_CARE_PROVIDER_SITE_OTHER): Payer: Medicare Other | Admitting: Internal Medicine

## 2012-12-08 VITALS — BP 121/67 | HR 109 | Temp 98.0°F | Wt 164.6 lb

## 2012-12-08 DIAGNOSIS — Z23 Encounter for immunization: Secondary | ICD-10-CM

## 2012-12-08 DIAGNOSIS — E785 Hyperlipidemia, unspecified: Secondary | ICD-10-CM

## 2012-12-08 DIAGNOSIS — I10 Essential (primary) hypertension: Secondary | ICD-10-CM

## 2012-12-08 DIAGNOSIS — E039 Hypothyroidism, unspecified: Secondary | ICD-10-CM

## 2012-12-08 LAB — HEPATIC FUNCTION PANEL
Albumin: 3.9 g/dL (ref 3.5–5.2)
Alkaline Phosphatase: 89 U/L (ref 39–117)

## 2012-12-08 LAB — BASIC METABOLIC PANEL
BUN: 29 mg/dL — ABNORMAL HIGH (ref 6–23)
Calcium: 9.3 mg/dL (ref 8.4–10.5)
Creatinine, Ser: 1.3 mg/dL — ABNORMAL HIGH (ref 0.4–1.2)
GFR: 41.93 mL/min — ABNORMAL LOW (ref >60.00)
Glucose, Bld: 93 mg/dL (ref 70–99)

## 2012-12-08 LAB — LIPID PANEL
Cholesterol: 235 mg/dL — ABNORMAL HIGH (ref 0–200)
HDL: 43.5 mg/dL (ref >39.00)
VLDL: 20 mg/dL (ref 0.0–40.0)

## 2012-12-08 NOTE — Assessment & Plan Note (Signed)
fasting Labs : Lipids, hepatic panel,  TSH. 

## 2012-12-08 NOTE — Assessment & Plan Note (Signed)
BMET BP goals discussed 

## 2012-12-08 NOTE — Assessment & Plan Note (Signed)
TSH 

## 2012-12-08 NOTE — Patient Instructions (Signed)

## 2012-12-08 NOTE — Progress Notes (Signed)
  Subjective:    Patient ID: Olivia [name], female    DOB: [birth year], 79 y.o.   MRN: [id number]  HPI She is here for HTN, hypothyroidism & dyslipidemia  follow up. No BP monitor @ home. Creatinine was 1.26 in 3/14; she sees Dr McDiarmid on average twice a year.  Off statins approximately 1 year; she is unsure why. In July 2013 HDL is 45.5 and LDL 129.2. Her TSH at that time was 6.29. Subsequently it returned normal at 2.95    Review of Systems  She denies significant headaches, epistaxis, chest pain, palpitations,  claudication, paroxysmal nocturnal dyspnea, or edema. DOE is chronic but stable ; she sees Dr Delford Field. She is not on a heart healthy diet; she is unable to exercise due to COPD. Family history is positive for premature coronary disease in her mother.  .      Objective:   Physical Exam Appears adequately nourished ;in no acute distress  No carotid bruits are present.No neck pain distention present at 10 - 15 degrees. Thyroid normal to palpation  Heart rhythm and rate are slightly irregular; sounds very distant; no significant murmurs or gallops.  Chest is clear with no increased work of breathing. Marked decrease in BS  Accentuated curvature of  thoracic  spine.  There is no evidence of aortic aneurysm or renal artery bruits  Abdomen soft with no organomegaly or masses. No HJR  No clubbing, cyanosis or edema present.  Pedal pulses are intact   No ischemic skin changes are present . Nails healthy without cyanosis; slight clubbing   Alert and oriented. Strength, tone, DTRs reflexes normal          Assessment & Plan:  See Current Assessment & Plan in Problem List under specific Diagnosis

## 2012-12-10 ENCOUNTER — Other Ambulatory Visit: Payer: Self-pay | Admitting: Internal Medicine

## 2012-12-10 DIAGNOSIS — E785 Hyperlipidemia, unspecified: Secondary | ICD-10-CM

## 2013-01-16 ENCOUNTER — Other Ambulatory Visit: Payer: Self-pay | Admitting: Internal Medicine

## 2013-01-16 NOTE — Telephone Encounter (Signed)
Paxil and Spiriva refilled per protocol

## 2013-02-05 ENCOUNTER — Other Ambulatory Visit: Payer: Self-pay | Admitting: Internal Medicine

## 2013-02-06 NOTE — Telephone Encounter (Signed)
Med filled.  

## 2013-02-17 ENCOUNTER — Other Ambulatory Visit: Payer: Self-pay | Admitting: Internal Medicine

## 2013-02-19 NOTE — Telephone Encounter (Signed)
Levothyroxine refilled per protocol. JG//CMA 

## 2013-03-02 ENCOUNTER — Telehealth: Payer: Self-pay | Admitting: Critical Care Medicine

## 2013-03-02 ENCOUNTER — Other Ambulatory Visit: Payer: Self-pay | Admitting: Critical Care Medicine

## 2013-03-02 NOTE — Telephone Encounter (Signed)
Last seen by PW 01/2012.  No pending appt. I have lmomtcb for pt to schedule a yearly OV. Given the weekend, I also sent rx to pharm x 1 with instructions to please schedule appt with PW for refills.   Will hold in my box to ensure OV f/u.

## 2013-03-02 NOTE — Telephone Encounter (Signed)
See phone note 03/02/13

## 2013-03-02 NOTE — Telephone Encounter (Signed)
Olivia Damian Leavell, RN at 03/02/2013 4:33 PM    Status: Signed        Last seen by PW 01/2012. No pending appt.  I have lmomtcb for pt to schedule a yearly OV.  Given the weekend, I also sent rx to pharm x 1 with instructions to please schedule appt with PW for refills.  Will hold in my box to ensure OV f/u.   Called pt. Appt scheduled for 03/19/13 at 3 pm in the [place] office. She is aware RX has also been sent. Nothing further needed

## 2013-03-16 ENCOUNTER — Telehealth: Payer: Self-pay | Admitting: *Deleted

## 2013-03-16 NOTE — Telephone Encounter (Signed)
Patient called and stated that she would like all of  her rx's to be sent to mail order now called [facility].

## 2013-03-16 NOTE — Telephone Encounter (Signed)
Patient called and stated that she would like for all her rx's to be sent by mail order to [facility].

## 2013-03-19 ENCOUNTER — Ambulatory Visit: Payer: Medicare Other | Admitting: Critical Care Medicine

## 2013-03-21 NOTE — Telephone Encounter (Signed)
LM @ (11:13am) asking the pt to RTC regarding note below about refilling meds.  Need to know which meds need to be refilled.//AB/CMA

## 2013-03-26 NOTE — Telephone Encounter (Signed)
Lm @ (5:03pm) asking the pt to RTC regarding what meds she's needing refilled.//AB/CMA

## 2013-03-28 ENCOUNTER — Telehealth: Payer: Self-pay | Admitting: *Deleted

## 2013-03-28 NOTE — Telephone Encounter (Signed)
Patient phoned requesting that her long term meds be changed to her new mail order pharmacy [facility] @ [phone].  Attempted to call patient back for further clarification, no answer, [facility] on home answering machine.   CB# [phone]

## 2013-03-28 NOTE — Telephone Encounter (Signed)
Left messages for the pt to RTC regarding her meds but she has not called back.//AB/CMA

## 2013-03-30 NOTE — Telephone Encounter (Signed)
Patient returned call to triage line yesterday at 1635.  Attempted to return patient's call, left message on voicemail.

## 2013-04-04 ENCOUNTER — Ambulatory Visit: Payer: Medicare Other | Admitting: Critical Care Medicine

## 2013-04-13 ENCOUNTER — Ambulatory Visit: Payer: Medicare Other | Admitting: Critical Care Medicine

## 2013-04-20 ENCOUNTER — Other Ambulatory Visit: Payer: Self-pay | Admitting: Critical Care Medicine

## 2013-04-20 NOTE — Telephone Encounter (Signed)
Pt last seen by PW 01/2012.  She has a pending OV with PW on May 04, 2013.  Will send rx to pharm.  Pt will need to keep appt for refills.

## 2013-04-24 ENCOUNTER — Telehealth: Payer: Self-pay | Admitting: *Deleted

## 2013-04-24 NOTE — Telephone Encounter (Signed)
Patient phoned stating insurance company will not cover her spiriva.  Per EMR review, PA sent to insurance company this morning.  Advised patient of status and that it would take possibly a week to 10 days.  Daughter is going to come p/u a month's worth of samples.4 boxes awaiting p/u

## 2013-04-24 NOTE — Telephone Encounter (Signed)
Prior authorization paperwork for Spiriva faxed. Awaiting response. JG//CMA

## 2013-05-04 ENCOUNTER — Encounter: Payer: Self-pay | Admitting: Critical Care Medicine

## 2013-05-04 ENCOUNTER — Ambulatory Visit: Payer: Medicare Other | Admitting: Critical Care Medicine

## 2013-05-04 ENCOUNTER — Ambulatory Visit (INDEPENDENT_AMBULATORY_CARE_PROVIDER_SITE_OTHER): Payer: Medicare Other | Admitting: Critical Care Medicine

## 2013-05-04 VITALS — BP 110/68 | HR 63 | Temp 96.8°F | Ht 61.0 in | Wt 165.4 lb

## 2013-05-04 DIAGNOSIS — J449 Chronic obstructive pulmonary disease, unspecified: Secondary | ICD-10-CM

## 2013-05-04 MED ORDER — FLUTICASONE FUROATE-VILANTEROL 100-25 MCG/INH IN AEPB: 1.0000 | INHALATION_SPRAY | Freq: Every day | RESPIRATORY_TRACT | Status: DC

## 2013-05-04 MED ORDER — ALBUTEROL SULFATE HFA 108 (90 BASE) MCG/ACT IN AERS: 2.0000 | INHALATION_SPRAY | Freq: Four times a day (QID) | RESPIRATORY_TRACT | Status: DC | PRN

## 2013-05-04 NOTE — Assessment & Plan Note (Signed)
Stop Spiriva and Advair Start Breo one puff daily,  Return 2 months

## 2013-05-04 NOTE — Patient Instructions (Addendum)
Stop Spiriva and Advair Start Breo one puff daily, use sample first .  This is 3rd tier on your plan You can use albuterol inhaler as needed Return 2 months

## 2013-05-04 NOTE — Progress Notes (Signed)
Subjective:    Patient ID: Olivia [name], female    DOB: [birth year], 80 y.o.   MRN: [id number]  HPI 80 y.o. WF  Hx of hoarseness and nodule on VC seen.  Dr Constance Holster wants to excise nodule. Pt denies cough.  Pt notes dyspnea with exertion. Pt on spiriva now and helps. Dx Copd long term.  05/04/2013 Chief Complaint  Patient presents with  . Yearly Follow up    Breathing is unchanged - has SOB with activity at occas at rest and ocas wheezing.  No cough.  Pt had the VC nodule removed.  It was precancerous.   Now unchanged with dyspnea and cough. Limited some with exertion.  Past Medical History  Diagnosis Date  . COPD (chronic obstructive pulmonary disease)   . Myopathy   . Left lumbar radiculopathy   . Hyperlipidemia   . Hypertension   . Osteoarthritis   . Hypothyroidism   . Anxiety   . Pneumonia 1/09    hx  . Shortness of breath   . Heart murmur   . History of recurrent UTIs   . GERD (gastroesophageal reflux disease)     occ  . Cancer     squamous cell rectal area     Family History  Problem Relation Age of Onset  . Arthritis Mother   . Diabetes Mother   . Heart disease Mother     MI @ 62  . Kidney disease Father   . Heart disease Father     Rheumatic Heart Disease  . Asthma Sister   . Cancer Brother     brother breast cancer, melanoma  . Stroke Maternal Grandfather     ? age  . Stroke Paternal Grandfather 59     History   Social History  . Marital Status: Widowed    Spouse Name: N/A    Number of Children: N/A  . Years of Education: N/A   Occupational History  . Not on file.   Social History Main Topics  . Smoking status: Former Smoker -- 1.00 packs/day for 57 years    Types: Cigarettes    Quit date: 02/26/2007  . Smokeless tobacco: Not on file     Comment: smoked age 12- 71 up to 1 ppd  occ alcohol  . Alcohol Use: Yes  . Drug Use: No  . Sexual Activity: Not on file   Other Topics Concern  . Not on file   Social History Narrative  . No  narrative on file     Allergies  Allergen Reactions  . Diclofenac     Hives  . Acetaminophen     Elevated LFTs  . Ultram [Tramadol]     vomiting  . Fluvastatin Sodium     Lescol caused flu symptoms  . Meperidine Hcl     vomiting     Outpatient Prescriptions Prior to Visit  Medication Sig Dispense Refill  . aspirin 81 MG tablet Take 81 mg by mouth daily.        . calcium carbonate (TUMS - DOSED IN MG ELEMENTAL CALCIUM) 500 MG chewable tablet Chew 1 tablet by mouth at bedtime as needed for heartburn.      . chlorthalidone (HYGROTON) 25 MG tablet TAKE ONE-HALF TABLET BY MOUTH ONCE DAILY  45 tablet  0  . cimetidine (TAGAMET) 200 MG tablet Take 100 mg by mouth at bedtime as needed (for heartburn).      . Dextromethorphan-Menthol (DELSYM COUGH RELIEF) 5-5 MG LOZG Use as directed  1 lozenge in the mouth or throat daily as needed (for tickle in throat).      [name] docusate sodium (COLACE) 100 MG capsule Take 100 mg by mouth daily as needed.       [name] FIBER PO Take 1 tablet by mouth daily as needed.       [name] guaiFENesin (MUCINEX) 600 MG 12 hr tablet Take 600 mg by mouth daily.       . hydroxypropyl methylcellulose (ISOPTO TEARS) 2.5 % ophthalmic solution Place 2 drops into both eyes 4 (four) times daily as needed (for dry eyes).      [name] levothyroxine (SYNTHROID, LEVOTHROID) 25 MCG tablet TAKE ONE TABLET BY MOUTH EVERY DAY (DO  NOT  TAKE  WITHIN  2  HOURS  OF  OTHER  MEDICATIONS)  30 tablet  5  . metoprolol succinate (TOPROL-XL) 25 MG 24 hr tablet 1 tab by mouth daily--Office visit due now  90 tablet  0  . PARoxetine (PAXIL) 20 MG tablet TAKE ONE TABLET BY MOUTH ONCE DAILY  30 tablet  3  . Throat Lozenges (HALLS COUGH DROPS) LOZG Use as directed 1 lozenge in the mouth or throat daily as needed (for tickle in throat).      . ADVAIR DISKUS 250-50 MCG/DOSE AEPB INHALE ONE DOSE BY MOUTH TWICE DAILY  60 each  0  . levothyroxine (SYNTHROID, LEVOTHROID) 25 MCG tablet Take 25 mcg by mouth daily.      .  metoprolol succinate (TOPROL-XL) 25 MG 24 hr tablet TAKE ONE TABLET BY MOUTH ONCE DAILY  90 tablet  0  . SPIRIVA HANDIHALER 18 MCG inhalation capsule INHALE ONE CAPSULE EVERY DAY  30 capsule  3  . ipratropium-albuterol (DUONEB) 0.5-2.5 (3) MG/3ML SOLN Take 3 mLs by nebulization every 4 (four) hours as needed (for shortness of breath). AS NEEDED      . traMADol (ULTRAM) 50 MG tablet Take 1 tablet (50 mg total) by mouth every 8 (eight) hours as needed for pain.  30 tablet  0   No facility-administered medications prior to visit.      Review of Systems  Constitutional: Negative for chills, diaphoresis, activity change, appetite change, fatigue and unexpected weight change.  HENT: Negative for congestion, dental problem, ear discharge, facial swelling, hearing loss, mouth sores, nosebleeds, postnasal drip, sinus pressure, sneezing, tinnitus, trouble swallowing and voice change.   Eyes: Negative for photophobia, discharge, itching and visual disturbance.  Respiratory: Negative for apnea, cough, choking, chest tightness and stridor.   Cardiovascular: Negative for palpitations.  Gastrointestinal: Negative for nausea, constipation, blood in stool and abdominal distention.  Genitourinary: Negative for dysuria, urgency, frequency, hematuria, flank pain, decreased urine volume and difficulty urinating.  Musculoskeletal: Negative for arthralgias, back pain, gait problem, joint swelling, myalgias and neck stiffness.  Skin: Negative for color change and pallor.  Neurological: Negative for dizziness, tremors, seizures, syncope, speech difficulty, weakness, light-headedness and numbness.  Hematological: Negative for adenopathy. Does not bruise/bleed easily.  Psychiatric/Behavioral: Negative for confusion, sleep disturbance and agitation. The patient is not nervous/anxious.        Objective:   Physical Exam  Filed Vitals:   05/04/13 1424  BP: 110/68  Pulse: 63  Temp: 96.8 F (36 C)  TempSrc: Oral   Height: 5\' 1"  (1.549 m)  Weight: 165 lb 6.4 oz (75.025 kg)  SpO2: 96%    Gen: Pleasant, well-nourished, in no distress,  normal affect  ENT: No lesions,  mouth clear,  oropharynx clear, no postnasal drip  Neck: No JVD, no TMG, no carotid bruits  Lungs: No use of accessory muscles, no dullness to percussion, distant breath sounds  Cardiovascular: RRR, heart sounds normal, no murmur or gallops, no peripheral edema  Abdomen: soft and NT, no HSM,  BS normal  Musculoskeletal: No deformities, no cyanosis or clubbing  Neuro: alert, non focal  Skin: Warm, no lesions or rashes  No results found.       Assessment & Plan:   COPD without exacerbation golds class C. Stop Spiriva and Advair Start Breo one puff daily,  Return 2 months    Updated Medication List Outpatient Encounter Prescriptions as of 05/04/2013  Medication Sig  . aspirin 81 MG tablet Take 81 mg by mouth daily.    . calcium carbonate (TUMS - DOSED IN MG ELEMENTAL CALCIUM) 500 MG chewable tablet Chew 1 tablet by mouth at bedtime as needed for heartburn.  . chlorthalidone (HYGROTON) 25 MG tablet TAKE ONE-HALF TABLET BY MOUTH ONCE DAILY  . cimetidine (TAGAMET) 200 MG tablet Take 100 mg by mouth at bedtime as needed (for heartburn).  . Dextromethorphan-Menthol (DELSYM COUGH RELIEF) 5-5 MG LOZG Use as directed 1 lozenge in the mouth or throat daily as needed (for tickle in throat).  [name] docusate sodium (COLACE) 100 MG capsule Take 100 mg by mouth daily as needed.   [name] FIBER PO Take 1 tablet by mouth daily as needed.   [name] guaiFENesin (MUCINEX) 600 MG 12 hr tablet Take 600 mg by mouth daily.   . hydroxypropyl methylcellulose (ISOPTO TEARS) 2.5 % ophthalmic solution Place 2 drops into both eyes 4 (four) times daily as needed (for dry eyes).  [name] levothyroxine (SYNTHROID, LEVOTHROID) 25 MCG tablet TAKE ONE TABLET BY MOUTH EVERY DAY (DO  NOT  TAKE  WITHIN  2  HOURS  OF  OTHER  MEDICATIONS)  . metoprolol succinate (TOPROL-XL) 25  MG 24 hr tablet 1 tab by mouth daily--Office visit due now  . PARoxetine (PAXIL) 20 MG tablet TAKE ONE TABLET BY MOUTH ONCE DAILY  . Throat Lozenges (HALLS COUGH DROPS) LOZG Use as directed 1 lozenge in the mouth or throat daily as needed (for tickle in throat).  . [DISCONTINUED] ADVAIR DISKUS 250-50 MCG/DOSE AEPB INHALE ONE DOSE BY MOUTH TWICE DAILY  . [DISCONTINUED] levothyroxine (SYNTHROID, LEVOTHROID) 25 MCG tablet Take 25 mcg by mouth daily.  . [DISCONTINUED] metoprolol succinate (TOPROL-XL) 25 MG 24 hr tablet TAKE ONE TABLET BY MOUTH ONCE DAILY  . [DISCONTINUED] SPIRIVA HANDIHALER 18 MCG inhalation capsule INHALE ONE CAPSULE EVERY DAY  . albuterol (PROVENTIL HFA;VENTOLIN HFA) 108 (90 BASE) MCG/ACT inhaler Inhale 2 puffs into the lungs every 6 (six) hours as needed for wheezing or shortness of breath.  . Fluticasone Furoate-Vilanterol (BREO ELLIPTA) 100-25 MCG/INH AEPB Inhale 1 puff into the lungs daily.  . Fluticasone Furoate-Vilanterol (BREO ELLIPTA) 100-25 MCG/INH AEPB Inhale 1 puff into the lungs daily.  [name] ipratropium-albuterol (DUONEB) 0.5-2.5 (3) MG/3ML SOLN Take 3 mLs by nebulization every 4 (four) hours as needed (for shortness of breath). AS NEEDED  . [DISCONTINUED] traMADol (ULTRAM) 50 MG tablet Take 1 tablet (50 mg total) by mouth every 8 (eight) hours as needed for pain.

## 2013-05-09 ENCOUNTER — Other Ambulatory Visit: Payer: Self-pay | Admitting: *Deleted

## 2013-05-09 MED ORDER — CHLORTHALIDONE 25 MG PO TABS: ORAL_TABLET | ORAL | Status: DC

## 2013-05-09 NOTE — Telephone Encounter (Signed)
Rx sent to the pharmacy by e-script.  Pt aware.  Informed the pt that she is due fasting labs.  Pt understood and agreed.//AB/CMA

## 2013-05-09 NOTE — Telephone Encounter (Signed)
LMOM (9:25am) asking the pt to RTC regarding med refill request.  Pt needs labs.//AB/CMA

## 2013-05-10 ENCOUNTER — Other Ambulatory Visit: Payer: Self-pay | Admitting: *Deleted

## 2013-05-10 MED ORDER — METOPROLOL SUCCINATE ER 25 MG PO TB24
ORAL_TABLET | ORAL | Status: DC
Start: 2013-05-10 — End: 2014-07-01

## 2013-05-28 ENCOUNTER — Other Ambulatory Visit: Payer: Self-pay

## 2013-05-28 ENCOUNTER — Telehealth: Payer: Self-pay | Admitting: Critical Care Medicine

## 2013-05-28 ENCOUNTER — Other Ambulatory Visit: Payer: Self-pay | Admitting: Internal Medicine

## 2013-05-28 MED ORDER — PREDNISONE 10 MG PO TABS: ORAL_TABLET | ORAL | Status: DC

## 2013-05-28 MED ORDER — AZITHROMYCIN 250 MG PO TABS: ORAL_TABLET | ORAL | Status: DC

## 2013-05-28 MED ORDER — PAROXETINE HCL 20 MG PO TABS: ORAL_TABLET | ORAL | Status: DC

## 2013-05-28 NOTE — Telephone Encounter (Signed)
Sent Rx for Bronchitis

## 2013-05-28 NOTE — Telephone Encounter (Signed)
Pt with URI and excess mucus.    Will call in prednisone/azithromycin

## 2013-05-28 NOTE — Telephone Encounter (Signed)
Patient last seen 12/08/12

## 2013-06-22 ENCOUNTER — Telehealth: Payer: Self-pay | Admitting: Critical Care Medicine

## 2013-06-22 MED ORDER — PREDNISONE 10 MG PO TABS: ORAL_TABLET | ORAL | Status: DC

## 2013-06-22 MED ORDER — AMOXICILLIN-POT CLAVULANATE 875-125 MG PO TABS: 1.0000 | ORAL_TABLET | Freq: Two times a day (BID) | ORAL | Status: DC

## 2013-06-22 NOTE — Telephone Encounter (Signed)
Spoke with pt. She c/o prod cough yellow phlem, wheezing, chest tx x couple weeks. She is taking mucinex 2 tabs daily. On 05/28/13 PW called in for URI zithromax and short course of prednisone. Pt requesting recs since it is the weekend. PW on vacation. Please advise MW thanks  Allergies  Allergen Reactions  . Diclofenac     Hives  . Acetaminophen     Elevated LFTs  . Ultram [Tramadol]     vomiting  . Fluvastatin Sodium     Lescol caused flu symptoms  . Meperidine Hcl     vomiting

## 2013-06-22 NOTE — Telephone Encounter (Signed)
Called and spoke with pt and she is aware of MW recs.  She is aware that meds have been sent to the pharmacy and she will take the yogurt at lunch.  Nothing further is needed.

## 2013-06-22 NOTE — Telephone Encounter (Signed)
Prednisone 10 mg take  4 each am x 2 days,   2 each am x 2 days,  1 each am x 2 days and stop Augmentin 875 mg take one pill twice daily  X 10 days - take at breakfast and supper with large glass of water.  It would help reduce the usual side effects (diarrhea and yeast infections) if you ate cultured yogurt at lunch.  

## 2013-07-06 ENCOUNTER — Telehealth: Payer: Self-pay | Admitting: Critical Care Medicine

## 2013-07-06 MED ORDER — CLOTRIMAZOLE 10 MG MT TROC: 10.0000 mg | Freq: Four times a day (QID) | OROMUCOSAL | Status: DC | PRN

## 2013-07-06 NOTE — Telephone Encounter (Signed)
Spoke with pt. Aware of recs. RX sent and will forward to PW as an [place]

## 2013-07-06 NOTE — Telephone Encounter (Signed)
Clotrimazole troch 10 mg #12  One qid prn

## 2013-07-06 NOTE — Telephone Encounter (Signed)
Called spoke with pt. She reports her mouth is very sore, tongue looks and feels raw. She has been on 2 rounds of ABX and feels this is coming from that. Wants something called in. Please advise MW as PW on vacation. Thanks --[facility] [place] Allergies  Allergen Reactions  . Diclofenac     Hives  . Acetaminophen     Elevated LFTs  . Ultram [Tramadol]     vomiting  . Fluvastatin Sodium     Lescol caused flu symptoms  . Meperidine Hcl     vomiting

## 2013-07-13 ENCOUNTER — Telehealth: Payer: Self-pay | Admitting: Critical Care Medicine

## 2013-07-13 NOTE — Telephone Encounter (Signed)
ATC line busy x 4 wcb 

## 2013-07-13 NOTE — Telephone Encounter (Signed)
Ok for [place] one puff bid

## 2013-07-13 NOTE — Telephone Encounter (Signed)
Called spoke with pt. Insurance no longer covers spiriva. Alternative was tudorza. Please advise Dr. Joya Gaskins thanks

## 2013-07-17 MED ORDER — MAGIC MOUTHWASH: 5.0000 mL | Freq: Three times a day (TID) | ORAL | Status: DC

## 2013-07-17 MED ORDER — ACLIDINIUM BROMIDE 400 MCG/ACT IN AEPB: 1.0000 | INHALATION_SPRAY | Freq: Two times a day (BID) | RESPIRATORY_TRACT | Status: DC

## 2013-07-17 NOTE — Telephone Encounter (Addendum)
Pt states that Clotrimazole 10mg  troche is not working -- still has thrush in mouth. Pt states that in the past she has been given MMW and would like to try this again.  Pt c.o of still having mouth discomfort. Allergies  Allergen Reactions  . Diclofenac     Hives  . Acetaminophen     Elevated LFTs  . Ultram [Tramadol]     vomiting  . Fluvastatin Sodium     Lescol caused flu symptoms  . Meperidine Hcl     vomiting  [facility]  [place]  Please advise [name]. Thanks.

## 2013-07-17 NOTE — Telephone Encounter (Signed)
Pt aware of recs per PW Rx sent to [place]  Nothing further needed.

## 2013-07-17 NOTE — Telephone Encounter (Signed)
Agree 

## 2013-07-17 NOTE — Telephone Encounter (Signed)
Per PW: Ok for MMW 5-10 mL swish, gargle, and expectorate tid #240 mL x 0

## 2013-07-17 NOTE — Telephone Encounter (Signed)
[id number].Aleila Syverson, CMA  

## 2013-07-20 MED ORDER — FIRST-DUKES MOUTHWASH MT SUSP: 5.0000 mL | Freq: Three times a day (TID) | OROMUCOSAL | Status: DC

## 2013-07-20 NOTE — Addendum Note (Signed)
Addended by: Virl Cagey on: 07/20/2013 05:06 PM   Modules accepted: Orders, Medications

## 2013-07-20 NOTE — Telephone Encounter (Addendum)
Spoke with [place] MMW to [place] (Diphenhyd-Hydrocort-Nystatin (FIRST-DUKES MOUTHWASH) SUSP) Phoned into pharmacy. Pt aware.  Nothing further needed.

## 2013-07-30 ENCOUNTER — Other Ambulatory Visit: Payer: Self-pay | Admitting: Critical Care Medicine

## 2013-07-30 ENCOUNTER — Other Ambulatory Visit: Payer: Self-pay | Admitting: Internal Medicine

## 2013-07-30 NOTE — Telephone Encounter (Signed)
OK X1 but needs OV  Before refills

## 2013-07-31 ENCOUNTER — Telehealth: Payer: Self-pay | Admitting: Critical Care Medicine

## 2013-07-31 MED ORDER — FLUTICASONE-SALMETEROL 250-50 MCG/DOSE IN AEPB: 1.0000 | INHALATION_SPRAY | Freq: Two times a day (BID) | RESPIRATORY_TRACT | Status: DC

## 2013-07-31 NOTE — Telephone Encounter (Signed)
We received refill request for Advair 250.  Per last OV with PW on 05/04/13:  Patient Instructions     Stop Spiriva and Advair  Start Breo one puff daily, use sample first . This is 3rd tier on your plan  You can use albuterol inhaler as needed  Return 2 months   -------  lmomtcb for pt.  Did she request the advair rx?

## 2013-07-31 NOTE — Telephone Encounter (Signed)
Spoke with the pt and advised okay to continue advair  Rx was refilled  Nothing further needed

## 2013-07-31 NOTE — Telephone Encounter (Signed)
Ok to refill advair

## 2013-07-31 NOTE — Telephone Encounter (Signed)
Spoke with the pt  She is requesting refill on advair  Per last ov note from March 2015, PW advised that she stop this and spiriva and start on Breo  Pt states that she was not doing well with Breo, having to use rescue inhaler too often and so PW told her to stop this and just take [place]  She has been taking tudorza and advair though and she is now almost out of advair  Please advise, thanks!

## 2013-08-02 NOTE — Telephone Encounter (Signed)
Pt returned call.  See phone msg from 07/31/13

## 2013-09-04 ENCOUNTER — Other Ambulatory Visit: Payer: Medicare Other

## 2013-09-12 ENCOUNTER — Other Ambulatory Visit: Payer: Self-pay | Admitting: *Deleted

## 2013-09-12 ENCOUNTER — Other Ambulatory Visit: Payer: Self-pay

## 2013-09-12 MED ORDER — LEVOTHYROXINE SODIUM 25 MCG PO TABS: ORAL_TABLET | ORAL | Status: DC

## 2013-09-12 NOTE — Telephone Encounter (Signed)
Left msg on triage ran out of her levothyroxine have appt to see md on next thurs, but wanting to know is it ok  Not to take for a week...Olivia [name]

## 2013-09-12 NOTE — Telephone Encounter (Signed)
Called pt no answer LMOM will send over a 30 day supply to [facility] until she see md on Thursday...Johny Chess

## 2013-09-13 ENCOUNTER — Other Ambulatory Visit (INDEPENDENT_AMBULATORY_CARE_PROVIDER_SITE_OTHER): Payer: Medicare Other

## 2013-09-13 ENCOUNTER — Ambulatory Visit (INDEPENDENT_AMBULATORY_CARE_PROVIDER_SITE_OTHER): Payer: Medicare Other | Admitting: Internal Medicine

## 2013-09-13 ENCOUNTER — Other Ambulatory Visit: Payer: Self-pay | Admitting: Internal Medicine

## 2013-09-13 ENCOUNTER — Encounter: Payer: Self-pay | Admitting: Internal Medicine

## 2013-09-13 VITALS — BP 98/70 | HR 104 | Temp 98.2°F | Wt 159.0 lb

## 2013-09-13 DIAGNOSIS — R112 Nausea with vomiting, unspecified: Secondary | ICD-10-CM

## 2013-09-13 DIAGNOSIS — E860 Dehydration: Secondary | ICD-10-CM

## 2013-09-13 DIAGNOSIS — N390 Urinary tract infection, site not specified: Secondary | ICD-10-CM

## 2013-09-13 DIAGNOSIS — E785 Hyperlipidemia, unspecified: Secondary | ICD-10-CM

## 2013-09-13 LAB — CBC WITH DIFFERENTIAL/PLATELET
BASOS ABS: 0 10*3/uL (ref 0.0–0.1)
Basophils Relative: 0.3 % (ref 0.0–3.0)
EOS ABS: 0.2 10*3/uL (ref 0.0–0.7)
Eosinophils Relative: 3.1 % (ref 0.0–5.0)
HCT: 41.4 % (ref 36.0–46.0)
Hemoglobin: 14.1 g/dL (ref 12.0–15.0)
LYMPHS PCT: 30.5 % (ref 12.0–46.0)
Lymphs Abs: 1.6 10*3/uL (ref 0.7–4.0)
MCHC: 33.9 g/dL (ref 30.0–36.0)
MCV: 92.5 fl (ref 78.0–100.0)
MONO ABS: 0.7 10*3/uL (ref 0.1–1.0)
Monocytes Relative: 13.8 % — ABNORMAL HIGH (ref 3.0–12.0)
NEUTROS PCT: 52.3 % (ref 43.0–77.0)
Neutro Abs: 2.8 10*3/uL (ref 1.4–7.7)
PLATELETS: 169 10*3/uL (ref 150.0–400.0)
RBC: 4.47 Mil/uL (ref 3.87–5.11)
RDW: 14.5 % (ref 11.5–15.5)
WBC: 5.3 10*3/uL (ref 4.0–10.5)

## 2013-09-13 LAB — URINALYSIS
Hgb urine dipstick: NEGATIVE
LEUKOCYTES UA: NEGATIVE
Nitrite: NEGATIVE
SPECIFIC GRAVITY, URINE: 1.025 (ref 1.000–1.030)
URINE GLUCOSE: NEGATIVE
UROBILINOGEN UA: 1 (ref 0.0–1.0)
pH: 6 (ref 5.0–8.0)

## 2013-09-13 NOTE — Progress Notes (Signed)
   Subjective:    Patient ID: Olivia [name], female    DOB: 03-27-1931, 80 y.o.   MRN: [id number]  HPI Pt woke up 09/11/13 "not feeling good" with generalized body aches. Pt began vomiting that night after having a suppressed appetite all day. She cannot recall how many times she vomited that night. The following morning at approximately 5am she had a fever of 99.2. She was currently taking leftover hydrocodone-acetaminophen 5-500mg  0.5 pill q 4 hours. She is taking leftover promethazine to help with nausea from the hydrocodone. She had these medications from her dentist.      Of note pt is currently taking Bactrim from her urologist for a UTI. The daughter states she was seen by her urologist 7/14 and the pt gave a urine sample. Three days later she was called in bactrim. She currently stopped taking the bactrim though she has 3 days left of her antibiotic.She worried the N/V Roig be from the bactrim.   She also has a productive cough with yellow sputum. She does have COPD. Her pulmonologist switched her from spiriva to tudorza-pressair a few months ago. She has had the cough since this time but feels her breathing is the best it's every been. She has no other URI symptoms.   Today the nausea has improved and the pt was able to keep down dinner and lunch. She has not had a fever since 09/12/13 though she is taking acetaminophen ATC. Her body aches are mainly in her shoulders, intrascapular region, and down her back.   Review of Systems  HENT: Negative for ear pain, rhinorrhea, sinus pressure and sore throat.   Cardiovascular: Negative for chest pain.  Gastrointestinal: Negative for abdominal pain.  Genitourinary: Negative for dysuria, hematuria and pelvic pain.  Musculoskeletal: Positive for back pain and myalgias.      Objective:   Physical Exam Erythematous oropharynx Poor dentition   R dull TM  Decreased BS in the bases  Increased AP diameter  Kyphosis  Distant heart sounds  Tachycardia        Assessment & Plan:

## 2013-09-13 NOTE — Progress Notes (Signed)
   Subjective:    Patient ID: Olivia [name], female    DOB: 1931-06-22, 80 y.o.   MRN: [id number]  HPI  She awoke 09/11/13 with malaise & generalized body aches. She began vomiting that might after having loss of appetite throughout the day. She is unsure how many times she vomited. The next morning at 5 AM she had a documented fever of 99.2. She began taking left over narcotic pain medication every four hours and left over promethazine to help with the nausea related to taking hydrocodone. These medicines had been prescribed by her Dentist. She saw the urologist 7/14; on 7/17 she started on Bactrim. The Bactrim was does continues as she felt the nausea vomiting was related to the sulfa. The nausea has improved and she was able to eat dinner last night at lunch today. She's had no fever since 7/22. She has continuing musculoskeletal discomfort in the itrascapular area, shoulders, and back.  Review of Systems  She has had a cough productive of yellow sputum. She has advanced emphysema. Her pulmonologist has changed her inhaler from Spiriva to [place] with dramatic improvement in her COPD symptoms.   She denies signs of rhinosinusitis such as frontal headache, facial pain, nasal purulence She denies any chest pain or abdominal pain. She is not having dysuria, pyuria, or hematuria.     Objective:   Physical Exam Positive findings: She appears fatigued and somewhat chronically ill. Her tongue is dry & coated. Heart sounds are distant with no significant murmur appreciated. Breath sounds are also decreased. She has no increased worker breathing at rest There is accentuation of thoracic curvature. She does have tenting of the skin. Pedal pulses are intact but decreased.  Eyes: No conjunctival inflammation or scleral icterus is present. Oral exam: There is no oropharyngeal erythema or exudate noted.  Abdomen: bowel sounds normal, soft and non-tender without masses, organomegaly or hernias  noted.  No guarding or rebound . No tenderness over the flanks to percussion Musculoskeletal: Able to lie flat and sit up without help. Negative straight leg raising bilaterally. Gait normal Skin:Warm & dry.  Intact without suspicious lesions or rashes ; no jaundice . Lymphatic: No lymphadenopathy is noted about the head, neck, axilla             Assessment & Plan:  #1clinical dehydration #2 N&V #3 hypotension #4UTI See orders & AVS

## 2013-09-13 NOTE — Patient Instructions (Signed)
Temporarily stop the following medications until notified to restart these: [place]  Decrease Metoprolol to 1/2 daily.   Stay on clear liquids for 48-72 hours .This would include  jello, sherbert (NOT ice cream), Lipton's chicken noodle soup(NOT cream based soups),Gatorade Lite, flat Ginger ale (without High Fructose Corn Syrup),dry toast or crackers, baked potato.No milk , dairy or grease until bowels are formed. Align , a [employer] , daily if stools are loose. Immodium AD for frankly watery stool. Report increasing pain, fever or rectal bleeding

## 2013-09-13 NOTE — Progress Notes (Signed)
Pre visit review using our clinic review tool, if applicable. No additional management support is needed unless otherwise documented below in the visit note. 

## 2013-09-14 LAB — BASIC METABOLIC PANEL
BUN: 25 mg/dL — AB (ref 6–23)
CALCIUM: 8.9 mg/dL (ref 8.4–10.5)
CHLORIDE: 102 meq/L (ref 96–112)
CO2: 27 meq/L (ref 19–32)
CREATININE: 1.8 mg/dL — AB (ref 0.4–1.2)
GFR: 28.75 mL/min — ABNORMAL LOW (ref >60.00)
GLUCOSE: 89 mg/dL (ref 70–99)
Potassium: 4.3 mEq/L (ref 3.5–5.1)
Sodium: 137 mEq/L (ref 135–145)

## 2013-09-14 LAB — HEPATIC FUNCTION PANEL
ALK PHOS: 87 U/L (ref 39–117)
ALT: 28 U/L (ref 0–35)
AST: 43 U/L — ABNORMAL HIGH (ref 0–37)
Albumin: 3.7 g/dL (ref 3.5–5.2)
BILIRUBIN DIRECT: 0.1 mg/dL (ref 0.0–0.3)
TOTAL PROTEIN: 7.5 g/dL (ref 6.0–8.3)
Total Bilirubin: 0.4 mg/dL (ref 0.2–1.2)

## 2013-09-15 ENCOUNTER — Other Ambulatory Visit: Payer: Self-pay | Admitting: Internal Medicine

## 2013-09-15 DIAGNOSIS — R7989 Other specified abnormal findings of blood chemistry: Secondary | ICD-10-CM

## 2013-09-15 LAB — NMR LIPOPROFILE WITH LIPIDS
CHOLESTEROL, TOTAL: 205 mg/dL — AB (ref <200)
HDL PARTICLE NUMBER: 21.7 umol/L — AB (ref >=30.5)
HDL Size: 9 nm — ABNORMAL LOW (ref >=9.2)
HDL-C: 39 mg/dL — AB (ref >=40)
LARGE HDL: 5 umol/L (ref >=4.8)
LDL CALC: 139 mg/dL — AB (ref <100)
LDL Particle Number: 1810 nmol/L — ABNORMAL HIGH (ref <1000)
LDL Size: 21.6 nm (ref >20.5)
Large VLDL-P: <0.8 nmol/L (ref <=2.7)
Small LDL Particle Number: 539 nmol/L — ABNORMAL HIGH (ref <=527)
TRIGLYCERIDES: 134 mg/dL (ref <150)
VLDL Size: 39 nm (ref <=46.6)

## 2013-09-16 ENCOUNTER — Encounter: Payer: Self-pay | Admitting: Internal Medicine

## 2013-09-26 ENCOUNTER — Other Ambulatory Visit: Payer: Self-pay | Admitting: Internal Medicine

## 2013-09-26 ENCOUNTER — Telehealth: Payer: Self-pay | Admitting: Internal Medicine

## 2013-09-26 NOTE — Telephone Encounter (Signed)
Pt call stated Dr. Linna Darner stop hygroton on 09/13/13, her ankle is swollen and eye lids is puffy. Pt is not sure stopping this med will be cause. Please advise.

## 2013-09-26 NOTE — Telephone Encounter (Signed)
OK X1 

## 2013-09-26 NOTE — Telephone Encounter (Signed)
Start back @ 1/2 pill & monitor swelling ; increase to one if needed after 48-72 hrs

## 2013-09-26 NOTE — Telephone Encounter (Signed)
09/13/13 last office visit

## 2013-09-27 NOTE — Telephone Encounter (Signed)
Called pt no answer LMOM RTC.../lmb 

## 2013-09-27 NOTE — Telephone Encounter (Signed)
Notified pt daughter with md response.../lmb 

## 2013-10-14 ENCOUNTER — Other Ambulatory Visit: Payer: Self-pay | Admitting: Internal Medicine

## 2013-11-21 ENCOUNTER — Ambulatory Visit (INDEPENDENT_AMBULATORY_CARE_PROVIDER_SITE_OTHER): Payer: Medicare Other | Admitting: Internal Medicine

## 2013-11-21 ENCOUNTER — Encounter: Payer: Self-pay | Admitting: Internal Medicine

## 2013-11-21 ENCOUNTER — Other Ambulatory Visit (INDEPENDENT_AMBULATORY_CARE_PROVIDER_SITE_OTHER): Payer: Medicare Other

## 2013-11-21 VITALS — BP 112/74 | HR 48 | Temp 97.8°F | Resp 12 | Wt 158.0 lb

## 2013-11-21 DIAGNOSIS — E039 Hypothyroidism, unspecified: Secondary | ICD-10-CM

## 2013-11-21 DIAGNOSIS — M48061 Spinal stenosis, lumbar region without neurogenic claudication: Secondary | ICD-10-CM | POA: Insufficient documentation

## 2013-11-21 DIAGNOSIS — E785 Hyperlipidemia, unspecified: Secondary | ICD-10-CM

## 2013-11-21 DIAGNOSIS — I10 Essential (primary) hypertension: Secondary | ICD-10-CM

## 2013-11-21 DIAGNOSIS — G479 Sleep disorder, unspecified: Secondary | ICD-10-CM

## 2013-11-21 DIAGNOSIS — R7989 Other specified abnormal findings of blood chemistry: Secondary | ICD-10-CM

## 2013-11-21 DIAGNOSIS — R799 Abnormal finding of blood chemistry, unspecified: Secondary | ICD-10-CM

## 2013-11-21 LAB — BASIC METABOLIC PANEL
BUN: 19 mg/dL (ref 6–23)
CO2: 27 meq/L (ref 19–32)
Calcium: 9.2 mg/dL (ref 8.4–10.5)
Chloride: 102 mEq/L (ref 96–112)
Creatinine, Ser: 1.2 mg/dL (ref 0.4–1.2)
GFR: 47.24 mL/min — ABNORMAL LOW (ref >60.00)
GLUCOSE: 99 mg/dL (ref 70–99)
POTASSIUM: 3.4 meq/L — AB (ref 3.5–5.1)
SODIUM: 137 meq/L (ref 135–145)

## 2013-11-21 LAB — TSH: TSH: 5.28 u[IU]/mL — ABNORMAL HIGH (ref 0.35–4.50)

## 2013-11-21 NOTE — Progress Notes (Signed)
Pre visit review using our clinic review tool, if applicable. No additional management support is needed unless otherwise documented below in the visit note. 

## 2013-11-21 NOTE — Assessment & Plan Note (Signed)
BMET 

## 2013-11-21 NOTE — Patient Instructions (Signed)
Minimal Blood Pressure Goal= AVERAGE < 150/90;  Ideal is an AVERAGE < 135/85. This AVERAGE should be calculated from @ least 5-7 BP readings taken @ different times of day on different days of week. You should not respond to isolated BP readings , but rather the AVERAGE for that week .Please bring your  blood pressure cuff to office visits to verify that it is reliable.It  can also be checked against the blood pressure device at the pharmacy. Finger or wrist cuffs are not dependable; an arm cuff is.   Please take a probiotic , Florastor OR Align, every day if stools loose. This will replace the normal bacteria which  are necessary for formation of normal stool and processing of food.  To prevent sleep dysfunction follow these instructions for sleep hygiene. Do not read, watch TV, or eat in bed. Do not get into bed until you are ready to turn off the light &  to go to sleep. Do not ingest stimulants ( decongestants, diet pills, nicotine, caffeine) after the evening meal.Do not take daytime naps.Cardiovascular exercise, this can be as simple a program as walking, is recommended 30-45 minutes 3-4 times per week. If you're not exercising you should take 6-8 weeks to build up to this level.

## 2013-11-21 NOTE — Assessment & Plan Note (Signed)
   Risks outweigh benefits of statins at her age

## 2013-11-21 NOTE — Assessment & Plan Note (Signed)
Blood pressure goals reviewed. BMET 

## 2013-11-21 NOTE — Progress Notes (Signed)
   Subjective:    Patient ID: Olivia [name], female    DOB: 1931-10-12, 80 y.o.   MRN: [id number]  HPI She is here in followup of her hypertension and hypothyroidism.  Additionally her creatinine was 1.8, BUN 25, and GFR 28.75 on 09/13/13. There's been no followup .She is on Chlorthalidone 12.5 mg daily. Last TSH was 3.19 on 12/08/12.  Her COPD is managed by pulmonology  She has been compliant with her blood pressure medications. She does not believe she has adverse effects related to these.  She thinks her blood pressures less than 140/90 but can give no exact recordings.  She eats country ham very frequently. She is unable to exercise because of her COPD.  Thyroid dose, brand, mode of administration have not changed  She does have some nail ridging and hair thinning. Her bowels vary. She feels that one of the pulmonary medications (Trudorza) caused diarrhea. This responded to a probiotic. Now she has constipation.   Review of Systems  She denies blurred vision, double vision, or loss of vision She has no intolerance to heat or cold. She continues to have low back pain in the context of spinal stenosis. They're inquiring about the best option.       Objective:   Physical Exam   Positive pertinent findings include: She has markedly weathered facies. There is increased AP diameter of the chest. There is prominence of the upper sternum with an acute angulation. There is accentuation of the thoracic spine curvature. Heart sounds and breath sounds at both distance. The rhythm is difficult to discern. She has clubbing of the nailbeds. Dorsalis pedis pulses are decreased. Venous spiders are present at the ankles.  General appearance :adequately nourished; in no distress. Eyes: No conjunctival inflammation or scleral icterus is present. Oral exam: Dental hygiene is fair. Lips and gums are healthy appearing.There is no oropharyngeal erythema or exudate noted.  Heart:  No gallop,  murmur, click, rub or other extra sounds   Lungs:No wheezes, rhonchi,rales ,or rubs present.No increased work of breathing.  Abdomen: bowel sounds normal, soft and non-tender without masses, organomegaly or hernias noted.  No guarding or rebound.  Vascular : all pulses equal ; no bruits present. Skin:Warm & dry.  Intact without suspicious lesions or rashes ; no jaundice or tenting Lymphatic: No lymphadenopathy is noted about the head, neck, axilla          Assessment & Plan:  See Current Assessment & Plan in Problem List under specific Diagnosis

## 2013-11-23 ENCOUNTER — Other Ambulatory Visit: Payer: Self-pay | Admitting: Internal Medicine

## 2013-11-23 DIAGNOSIS — E876 Hypokalemia: Secondary | ICD-10-CM

## 2013-11-23 DIAGNOSIS — E039 Hypothyroidism, unspecified: Secondary | ICD-10-CM

## 2013-11-26 ENCOUNTER — Other Ambulatory Visit: Payer: Self-pay | Admitting: Internal Medicine

## 2013-11-26 NOTE — Telephone Encounter (Signed)
OK X 3 mos 

## 2013-12-07 ENCOUNTER — Other Ambulatory Visit: Payer: Self-pay

## 2014-03-20 ENCOUNTER — Telehealth: Payer: Self-pay | Admitting: Critical Care Medicine

## 2014-03-20 NOTE — Telephone Encounter (Signed)
lmomtcb x1 

## 2014-03-21 NOTE — Telephone Encounter (Signed)
Spoke with pt's daughter. States that pt's insurance will pay for Atrovent or Incruse to replace [place].  PW - please advise. Thanks.

## 2014-03-25 MED ORDER — UMECLIDINIUM BROMIDE 62.5 MCG/INH IN AEPB: 1.0000 | INHALATION_SPRAY | Freq: Every day | RESPIRATORY_TRACT | Status: DC

## 2014-03-25 NOTE — Telephone Encounter (Signed)
Spoke with [employer].  She is aware Caprice Renshaw will be changed to Incruse 1 puff qd and would like rx sent to [facility].  They will call office back if need inhaler teaching is needed or if pt has any problems/breathing worsens with new medication.  Beth in agreement with plan and voiced no further questions or concerns at this time.

## 2014-03-25 NOTE — Telephone Encounter (Signed)
Pt's dtr Eustaquio Maize - returning call - [phone]

## 2014-03-25 NOTE — Telephone Encounter (Signed)
lmtcb for daughter 

## 2014-03-25 NOTE — Telephone Encounter (Signed)
i would change to incruse one puff daily

## 2014-03-27 ENCOUNTER — Other Ambulatory Visit: Payer: Self-pay | Admitting: Internal Medicine

## 2014-03-28 NOTE — Telephone Encounter (Signed)
OK X 3 mos 

## 2014-04-04 ENCOUNTER — Other Ambulatory Visit: Payer: Self-pay | Admitting: Internal Medicine

## 2014-04-22 ENCOUNTER — Telehealth: Payer: Self-pay | Admitting: Internal Medicine

## 2014-04-22 DIAGNOSIS — E038 Other specified hypothyroidism: Secondary | ICD-10-CM

## 2014-04-22 DIAGNOSIS — I519 Heart disease, unspecified: Secondary | ICD-10-CM

## 2014-04-22 DIAGNOSIS — E876 Hypokalemia: Secondary | ICD-10-CM

## 2014-04-22 DIAGNOSIS — E039 Hypothyroidism, unspecified: Secondary | ICD-10-CM

## 2014-04-22 NOTE — Telephone Encounter (Signed)
Pt request phone call from the assistant, pt stated she lost about 10 lb and she was thinking it might be because of levothyroxine that she is taking. Offer an appt but pt would like to speak to the assistant first.

## 2014-04-22 NOTE — Telephone Encounter (Signed)
Patient has been advised to come for labs and then schedule an office visit.

## 2014-04-22 NOTE — Telephone Encounter (Signed)
Fasting labs  (BMET & TSH) entered 11/23/13 these need to be completed & then F/U OV

## 2014-04-28 ENCOUNTER — Other Ambulatory Visit: Payer: Self-pay | Admitting: Critical Care Medicine

## 2014-04-30 ENCOUNTER — Other Ambulatory Visit (INDEPENDENT_AMBULATORY_CARE_PROVIDER_SITE_OTHER): Payer: Medicare Other

## 2014-04-30 DIAGNOSIS — E876 Hypokalemia: Secondary | ICD-10-CM

## 2014-04-30 DIAGNOSIS — E038 Other specified hypothyroidism: Secondary | ICD-10-CM

## 2014-04-30 LAB — BASIC METABOLIC PANEL
BUN: 21 mg/dL (ref 6–23)
CALCIUM: 9.4 mg/dL (ref 8.4–10.5)
CO2: 33 mEq/L — ABNORMAL HIGH (ref 19–32)
Chloride: 101 mEq/L (ref 96–112)
Creatinine, Ser: 1.04 mg/dL (ref 0.40–1.20)
GFR: 54.06 mL/min — AB (ref >60.00)
Glucose, Bld: 91 mg/dL (ref 70–99)
Potassium: 3.9 mEq/L (ref 3.5–5.1)
Sodium: 138 mEq/L (ref 135–145)

## 2014-04-30 LAB — TSH: TSH: 3.33 u[IU]/mL (ref 0.35–4.50)

## 2014-05-03 ENCOUNTER — Other Ambulatory Visit: Payer: Self-pay | Admitting: Critical Care Medicine

## 2014-05-10 ENCOUNTER — Other Ambulatory Visit: Payer: Self-pay | Admitting: Critical Care Medicine

## 2014-06-14 ENCOUNTER — Telehealth: Payer: Self-pay | Admitting: Critical Care Medicine

## 2014-06-14 NOTE — Telephone Encounter (Signed)
Patient wants [place] to try to get a PA for [place].  She thinks that the [place] worked better than Incruse and thinks that if we do a PA on it, her insurance will approve it.    Ok to switch patient back to [place] and initiate PA?  To MW in PW absence. (patient states that she has seen MW before and would like message sent to him in PW absence)

## 2014-06-15 NOTE — Telephone Encounter (Signed)
Fine with me - I have plenty of samples in my office to bridge the gap if needed

## 2014-06-17 MED ORDER — ACLIDINIUM BROMIDE 400 MCG/ACT IN AEPB: 1.0000 | INHALATION_SPRAY | Freq: Two times a day (BID) | RESPIRATORY_TRACT | Status: DC

## 2014-06-17 NOTE — Telephone Encounter (Signed)
Pt is aware that we will switch her back to [place]. Rx has been sent in. Will initiate PA once it comes in.

## 2014-06-17 NOTE — Telephone Encounter (Signed)
LMTC x 1  

## 2014-06-26 ENCOUNTER — Telehealth: Payer: Self-pay | Admitting: *Deleted

## 2014-06-26 NOTE — Telephone Encounter (Signed)
Submitted paper PA to [facility] on 06/25/14 via fax.. Attached last office note to support.  Tudurza 400 mcg/act. Pharmacy [facility]

## 2014-06-28 ENCOUNTER — Telehealth: Payer: Self-pay | Admitting: Critical Care Medicine

## 2014-06-28 ENCOUNTER — Other Ambulatory Visit: Payer: Self-pay | Admitting: Critical Care Medicine

## 2014-06-28 MED ORDER — FLUTICASONE-SALMETEROL 250-50 MCG/DOSE IN AEPB: 1.0000 | INHALATION_SPRAY | Freq: Two times a day (BID) | RESPIRATORY_TRACT | Status: DC

## 2014-06-28 NOTE — Telephone Encounter (Signed)
Pt states she spoke to [facility] today & they told her they denied the appeal on 5/3. She wants [place] to call [phone] & speak to customer service. She states they will need her  id # [id number] & reference # [id number]. Pt's # [id number]

## 2014-06-28 NOTE — Telephone Encounter (Signed)
Called and spoke to pt. Informed pt we will check with PW to see if he is willing to proceed with appeal of Tudorza. Pt aware that we will contact her next week. Pt verbalized understanding.   PW please advise.

## 2014-06-28 NOTE — Telephone Encounter (Signed)
Called and spoke to pt. Pt needing advair refill. Rx sent to preferred pharmacy. Appt made with PW on 5/11. Pt verbalized understanding and denied any further questions or concerns at this time.

## 2014-06-29 NOTE — Telephone Encounter (Signed)
Will they pay for spiriva respimat or atrovent hfa ?

## 2014-07-01 ENCOUNTER — Other Ambulatory Visit: Payer: Self-pay

## 2014-07-01 MED ORDER — METOPROLOL SUCCINATE ER 25 MG PO TB24: ORAL_TABLET | ORAL | Status: DC

## 2014-07-01 MED ORDER — PAROXETINE HCL 20 MG PO TABS: 20.0000 mg | ORAL_TABLET | Freq: Every day | ORAL | Status: DC

## 2014-07-01 NOTE — Telephone Encounter (Signed)
lmtcb x1 for pt. 

## 2014-07-01 NOTE — Telephone Encounter (Signed)
OK X1 

## 2014-07-01 NOTE — Telephone Encounter (Signed)
Okay for refill on Paroxetine?

## 2014-07-03 ENCOUNTER — Ambulatory Visit (INDEPENDENT_AMBULATORY_CARE_PROVIDER_SITE_OTHER): Payer: Medicare Other | Admitting: Critical Care Medicine

## 2014-07-03 ENCOUNTER — Encounter: Payer: Self-pay | Admitting: Critical Care Medicine

## 2014-07-03 ENCOUNTER — Ambulatory Visit (INDEPENDENT_AMBULATORY_CARE_PROVIDER_SITE_OTHER)
Admission: RE | Admit: 2014-07-03 | Discharge: 2014-07-03 | Disposition: A | Discharge status: Home / Self Care | Payer: Medicare Other | Source: Ambulatory Visit | Attending: Critical Care Medicine | Admitting: Critical Care Medicine

## 2014-07-03 VITALS — BP 120/70 | HR 57 | Temp 97.8°F | Ht 61.0 in | Wt 153.4 lb

## 2014-07-03 DIAGNOSIS — J449 Chronic obstructive pulmonary disease, unspecified: Secondary | ICD-10-CM

## 2014-07-03 DIAGNOSIS — Z23 Encounter for immunization: Secondary | ICD-10-CM | POA: Diagnosis not present

## 2014-07-03 MED ORDER — UMECLIDINIUM BROMIDE 62.5 MCG/INH IN AEPB: 1.0000 | INHALATION_SPRAY | Freq: Every day | RESPIRATORY_TRACT | Status: AC

## 2014-07-03 NOTE — Assessment & Plan Note (Signed)
Gold C Copd with adverse side effect from DPI d/t inhaler technique. Need for pneumonia vaccine prevnar 13 Need for f/u cxr Plan Cont incruse and advair  Prn saba prevnar 13 given

## 2014-07-03 NOTE — Progress Notes (Signed)
Subjective:    Patient ID: Olivia [name], female    DOB: May 16, 1931, 81 y.o.   MRN: [id number]  HPI 07/03/2014 Chief Complaint  Patient presents with  . Follow-up    Pt c/o hoarness started after taking incruise, SOB, dry cough x 1 month.     Pt hoarse with incruse dpi.  Pt remains dyspnea on exertion.  Dry cough noted. No chest pain, no f/c/s.   Pt denies any significant sore throat, nasal congestion or excess secretions, fever, chills, sweats, unintended weight loss, pleurtic or exertional chest pain, orthopnea PND, or leg swelling Pt denies any increase in rescue therapy over baseline, denies waking up needing it or having any early am or nocturnal exacerbations of coughing/wheezing/or dyspnea. Pt also denies any obvious fluctuation in symptoms with  weather or environmental change or other alleviating or aggravating factors   Current Medications, Allergies, Complete Past Medical History, Past Surgical History, Family History, and Social History were reviewed in [employer] record.  Past Medical History  Diagnosis Date  . COPD (chronic obstructive pulmonary disease)   . Myopathy   . Left lumbar radiculopathy   . Hyperlipidemia   . Hypertension   . Osteoarthritis   . Hypothyroidism   . Anxiety   . Pneumonia 1/09    hx  . Shortness of breath   . Heart murmur   . History of recurrent UTIs   . GERD (gastroesophageal reflux disease)     occ  . Cancer     squamous cell rectal area     Family History  Problem Relation Age of Onset  . Arthritis Mother   . Diabetes Mother   . Heart disease Mother     MI @ 26  . Kidney disease Father   . Heart disease Father     Rheumatic Heart Disease  . Asthma Sister   . Cancer Brother     brother breast cancer, melanoma  . Stroke Maternal Grandfather     ? age  . Stroke Paternal Grandfather 88     History   Social History  . Marital Status: Widowed    Spouse Name: N/A  . Number of Children: N/A  .  Years of Education: N/A   Occupational History  . Not on file.   Social History Main Topics  . Smoking status: Former Smoker -- 1.00 packs/day for 57 years    Types: Cigarettes    Quit date: 02/26/2007  . Smokeless tobacco: Not on file     Comment: smoked age 81- 71 up to 1 ppd  occ alcohol  . Alcohol Use: Yes  . Drug Use: No  . Sexual Activity: Not on file   Other Topics Concern  . Not on file   Social History Narrative     Allergies  Allergen Reactions  . Diclofenac     Hives  . Acetaminophen     Elevated LFTs  . Sulfa Antibiotics Nausea And Vomiting  . Ultram [Tramadol]     vomiting  . Fluvastatin Sodium     Lescol caused flu symptoms  . Meperidine Hcl     vomiting     Outpatient Prescriptions Prior to Visit  Medication Sig Dispense Refill  . albuterol (PROVENTIL HFA;VENTOLIN HFA) 108 (90 BASE) MCG/ACT inhaler Inhale 2 puffs into the lungs every 6 (six) hours as needed for wheezing or shortness of breath. 1 Inhaler 6  . aspirin 81 MG tablet Take 81 mg by mouth daily.      [name]  calcium carbonate (TUMS - DOSED IN MG ELEMENTAL CALCIUM) 500 MG chewable tablet Chew 1 tablet by mouth at bedtime as needed for heartburn.    . chlorthalidone (HYGROTON) 25 MG tablet TAKE ONE-HALF TABLET BY MOUTH ONCE DAILY. 45 tablet 5  . cimetidine (TAGAMET) 200 MG tablet Take 100 mg by mouth at bedtime as needed (for heartburn).    . docusate sodium (COLACE) 100 MG capsule Take 100 mg by mouth daily as needed.     [name] FIBER PO Take 1 tablet by mouth daily as needed.     . Fluticasone-Salmeterol (ADVAIR DISKUS) 250-50 MCG/DOSE AEPB Inhale 1 puff into the lungs 2 (two) times daily. 60 each 3  . guaiFENesin (MUCINEX) 600 MG 12 hr tablet Take 600 mg by mouth daily.     . hydroxypropyl methylcellulose (ISOPTO TEARS) 2.5 % ophthalmic solution Place 2 drops into both eyes 4 (four) times daily as needed (for dry eyes).    [name] levothyroxine (SYNTHROID, LEVOTHROID) 25 MCG tablet TAKE ONE TABLET BY MOUTH  ONCE DAILY (Patient taking differently: TAKE ONE TABLET BY MOUTH ONCE DAILY, Wed take 1.5 tablet) 30 tablet 5  . metoprolol succinate (TOPROL-XL) 25 MG 24 hr tablet 1 tab by mouth daily 90 tablet 1  . PARoxetine (PAXIL) 20 MG tablet Take 1 tablet (20 mg total) by mouth daily. 90 tablet 0  . Throat Lozenges (HALLS COUGH DROPS) LOZG Use as directed 1 lozenge in the mouth or throat daily as needed (for tickle in throat).    [name] Umeclidinium Bromide (INCRUSE ELLIPTA) 62.5 MCG/INH AEPB Inhale 1 puff into the lungs daily. 1 each 3  . clotrimazole (MYCELEX) 10 MG troche Take 1 tablet (10 mg total) by mouth 4 (four) times daily as needed. (Patient not taking: Reported on 07/03/2014) 12 tablet 0  . Dextromethorphan-Menthol (DELSYM COUGH RELIEF) 5-5 MG LOZG Use as directed 1 lozenge in the mouth or throat daily as needed (for tickle in throat).    [name] ipratropium-albuterol (DUONEB) 0.5-2.5 (3) MG/3ML SOLN Take 3 mLs by nebulization every 4 (four) hours as needed (for shortness of breath). AS NEEDED    . Aclidinium Bromide (TUDORZA PRESSAIR) 400 MCG/ACT AEPB Inhale 1 puff into the lungs 2 (two) times daily. (Patient not taking: Reported on 07/03/2014) 1 each 5  . Fluticasone Furoate-Vilanterol (BREO ELLIPTA) 100-25 MCG/INH AEPB Inhale 1 puff into the lungs daily. (Patient not taking: Reported on 07/03/2014) 30 each 11   No facility-administered medications prior to visit.    Review of Systems  Constitutional: Negative for fever, chills, diaphoresis, activity change, appetite change, fatigue and unexpected weight change.  HENT: Negative for congestion, dental problem, ear discharge, ear pain, facial swelling, hearing loss, mouth sores, nosebleeds, postnasal drip, rhinorrhea, sinus pressure, sneezing, sore throat, tinnitus, trouble swallowing and voice change.   Eyes: Negative for photophobia, discharge, itching and visual disturbance.  Respiratory: Positive for cough and shortness of breath. Negative for apnea,  choking, chest tightness, wheezing and stridor.   Cardiovascular: Negative for chest pain, palpitations and leg swelling.  Gastrointestinal: Negative for nausea, vomiting, abdominal pain, constipation, blood in stool and abdominal distention.  Genitourinary: Negative for dysuria, urgency, frequency, hematuria, flank pain, decreased urine volume and difficulty urinating.  Musculoskeletal: Negative for myalgias, back pain, joint swelling, arthralgias, gait problem, neck pain and neck stiffness.  Skin: Negative for color change, pallor and rash.  Neurological: Negative for dizziness, tremors, seizures, syncope, speech difficulty, weakness, light-headedness, numbness and headaches.  Hematological: Negative for adenopathy. Does not bruise/bleed  easily.  Psychiatric/Behavioral: Negative for confusion, sleep disturbance and agitation. The patient is not nervous/anxious.        Objective:   Physical Exam  Constitutional: She appears well-developed and well-nourished. She is active.  Non-toxic appearance. She does not appear ill. No distress.  HENT:  Head: Normocephalic and atraumatic.  Nose: No mucosal edema, rhinorrhea, sinus tenderness, nasal deformity or septal deviation. No epistaxis. Right sinus exhibits no maxillary sinus tenderness and no frontal sinus tenderness. Left sinus exhibits no maxillary sinus tenderness and no frontal sinus tenderness.  Mouth/Throat: Oropharynx is clear and moist. No oropharyngeal exudate.  Eyes: Conjunctivae and EOM are normal. Pupils are equal, round, and reactive to light. Right eye exhibits no discharge. Left eye exhibits no discharge. No scleral icterus.  Neck: Normal range of motion. Neck supple. Normal carotid pulses, no hepatojugular reflux and no JVD present. No tracheal tenderness and no muscular tenderness present. Carotid bruit is not present. No rigidity. No tracheal deviation, no edema, no erythema and normal range of motion present. No thyroid mass and no  thyromegaly present.  Cardiovascular: Normal rate, regular rhythm, S1 normal, S2 normal, normal heart sounds, intact distal pulses and normal pulses.  PMI is not displaced.  Exam reveals no gallop, no S3, no S4, no distant heart sounds and no friction rub.   No murmur heard.  No systolic murmur is present   No diastolic murmur is present  Pulmonary/Chest: No accessory muscle usage or stridor. No apnea and no tachypnea. No respiratory distress. She has decreased breath sounds. She has no wheezes. She has no rhonchi. She has no rales. Chest wall is not dull to percussion. She exhibits no mass, no tenderness, no bony tenderness and no deformity.  Abdominal: Soft. Normal appearance and bowel sounds are normal. She exhibits no distension, no ascites and no mass. There is no hepatosplenomegaly. There is no tenderness. There is no rigidity, no rebound, no guarding and no CVA tenderness.  Musculoskeletal: Normal range of motion.  Lymphadenopathy:       Head (right side): No submental and no submandibular adenopathy present.       Head (left side): No submental and no submandibular adenopathy present.    She has no cervical adenopathy.    She has no axillary adenopathy.  Neurological: She is alert. She has normal strength and normal reflexes. No cranial nerve deficit or sensory deficit.  Skin: Skin is warm and dry. No bruising, no ecchymosis, no lesion and no rash noted. She is not diaphoretic. No cyanosis or erythema. No pallor. Nails show no clubbing.  Psychiatric: She has a normal mood and affect. Her speech is normal and behavior is normal.  Vitals reviewed.         Assessment & Plan:  I personally reviewed all images and lab data in the [facility] system as well as any outside material available during this office visit and agree with the  radiology impressions.  I also have reviewed any data /notes/records if available in care everywhere.  COPD without exacerbation golds class C. Gold C Copd with  adverse side effect from DPI d/t inhaler technique. Need for pneumonia vaccine prevnar 13 Need for f/u cxr Plan Cont incruse and advair  Prn saba prevnar 13 given   f/u cxr ordered  Sharie was seen today for follow-up.  Diagnoses and all orders for this visit:  COPD without exacerbation golds class C. Orders: -     DG Chest 2 View; Future -  Umeclidinium Bromide (INCRUSE ELLIPTA) 62.5 MCG/INH AEPB; Inhale 1 puff into the lungs daily.  Need for prophylactic vaccination and inoculation against cholera alone Orders: -     Pneumococcal conjugate vaccine 13-valent IM (Prevnar)    I

## 2014-07-03 NOTE — Patient Instructions (Signed)
Prevnar 13 was given Stay on Incruse and Advair A chest xray will be done Use albuterol as needed Return 6 months

## 2014-07-04 ENCOUNTER — Telehealth: Payer: Self-pay | Admitting: Critical Care Medicine

## 2014-07-04 NOTE — Progress Notes (Signed)
Quick Note:  lmomtcb for pt ______ 

## 2014-07-04 NOTE — Progress Notes (Signed)
Quick Note:  Notify the patient that the Xray is stable and no pneumonia No change in medications are recommended. Continue current meds as prescribed at last office visit ______ 

## 2014-07-04 NOTE — Progress Notes (Signed)
Quick Note:  Called and spoke to [place], [facility]. Informed her of the results and recs per PW. Olivia [name] verbalized understanding and denied any further questions or concerns at this time. ______

## 2014-07-04 NOTE — Telephone Encounter (Signed)
Called and spoke to [place], [facility]. Informed her of the results and recs per PW. Benjamine Mola verbalized understanding and denied any further questions or concerns at this time.    Notes Recorded by Elsie Stain, MD on 07/04/2014 at 11:46 AM Notify the patient that the Xray is stable and no pneumonia No change in medications are recommended. Continue current meds as prescribed at last office visit

## 2014-07-09 NOTE — Telephone Encounter (Signed)
Pt saw PW on 07/03/14 Olivia [name] was denied by insurance. Pt will take Advair and Incruse. Nothing needed further.

## 2014-07-30 MED ORDER — CHLORTHALIDONE 25 MG PO TABS: ORAL_TABLET | ORAL | Status: DC

## 2014-08-14 ENCOUNTER — Other Ambulatory Visit: Payer: Self-pay | Admitting: Critical Care Medicine

## 2014-08-28 ENCOUNTER — Ambulatory Visit (INDEPENDENT_AMBULATORY_CARE_PROVIDER_SITE_OTHER): Payer: Medicare Other | Admitting: Internal Medicine

## 2014-08-28 ENCOUNTER — Ambulatory Visit: Payer: 59 | Admitting: Internal Medicine

## 2014-08-28 ENCOUNTER — Encounter: Payer: Self-pay | Admitting: Internal Medicine

## 2014-08-28 ENCOUNTER — Other Ambulatory Visit (INDEPENDENT_AMBULATORY_CARE_PROVIDER_SITE_OTHER): Payer: Medicare Other

## 2014-08-28 VITALS — BP 124/70 | HR 68 | Temp 97.6°F | Resp 18 | Wt 152.0 lb

## 2014-08-28 DIAGNOSIS — M549 Dorsalgia, unspecified: Secondary | ICD-10-CM | POA: Diagnosis not present

## 2014-08-28 DIAGNOSIS — R490 Dysphonia: Secondary | ICD-10-CM | POA: Diagnosis not present

## 2014-08-28 DIAGNOSIS — R58 Hemorrhage, not elsewhere classified: Secondary | ICD-10-CM | POA: Diagnosis not present

## 2014-08-28 DIAGNOSIS — M25551 Pain in right hip: Secondary | ICD-10-CM

## 2014-08-28 DIAGNOSIS — M25552 Pain in left hip: Secondary | ICD-10-CM

## 2014-08-28 DIAGNOSIS — M4806 Spinal stenosis, lumbar region: Secondary | ICD-10-CM

## 2014-08-28 DIAGNOSIS — M48061 Spinal stenosis, lumbar region without neurogenic claudication: Secondary | ICD-10-CM

## 2014-08-28 MED ORDER — AMITRIPTYLINE HCL 10 MG PO TABS: 10.0000 mg | ORAL_TABLET | Freq: Every day | ORAL | Status: DC

## 2014-08-28 NOTE — Patient Instructions (Addendum)
The low-dose amitriptyline is for nerve pain related to spinal disease. It is not being prescribed for depression. Studies at [facility] have shown dramatic improvement with this medication for neuropathic pain.  Reflux of gastric acid Keegan be asymptomatic as this Lambing occur mainly during sleep.The triggers for reflux  include stress; the "aspirin family" ; alcohol; peppermint; and caffeine (coffee, tea, cola, and chocolate). The aspirin family would include aspirin and the nonsteroidal agents such as ibuprofen &  Naproxen. Tylenol would not cause reflux. If having symptoms ; food & drink should be avoided for @ least 2 hours before going to bed.

## 2014-08-28 NOTE — Progress Notes (Signed)
   Subjective:    Patient ID: Olivia [name], female    DOB: [birth year], 81 y.o.   MRN: [id number]  HPI She describes diffuse back pain basically from the base of her neck to the hips. She also has bilateral hip pain. She denies shoulder and neck pain per se. She had been prescribed amitriptyline 10 mg but did not fill the prescription. She is questioning seeing a back specialist. In 2008 an MRI did show multilevel spinal stenosis.She had epidural steroid injection in 2007. She's had some urinary incontinence when she lifts a gallon jug for example. There's been some stool leakage since she had her colon surgery. She has numbness in her feet. She has no other significant neuromuscular symptoms  She is concerned about bruising of her legs. She denies other bleeding dyscrasias.  She also has hoarseness. This persists despite changing her technique using the inhalers. Specifically she does gargle after she uses them. She does have a history of vocal cord lesion removed several years ago. Her voice did improve after the surgery but recurred after her inhalers were changed.   She does have significant rhinitis. She also has minor reflux. She does eat in the middle the night. Her chronic dyspnea is unchanged. She has occasional cough. She denies wheezing.   She does have posterior chest and rib pain with laughing at times.  Review of Systems Epistaxis, hemoptysis, hematuria, melena, or rectal bleeding denied. No unexplained weight loss, significant dyspepsia,dysphagia, or abdominal pain.  There is no abnormal bleeding or difficulty stopping bleeding with injury.     Objective:   Physical Exam  She is hard of hearing. She is an extremely poor historian. Questions are followed by long pauses and then vague answers which [place] have nothing to do with the question. There is increased AP diameter. Breath sounds are markedly decreased. She has a grade 1 systolic murmur. Abdomen is protuberant but nontender.  Pedal pulses are decreased. She has scattered ecchymoses over the lower extremities, left lower extremity greater than the right.  Pertinent or positive findings include: General appearance :adequately nourished; in no distress.  Eyes: No conjunctival inflammation or scleral icterus is present.  Oral exam:  Lips and gums are healthy appearing.There is no oropharyngeal erythema or exudate noted.  Heart:  Normal rate and regular rhythm. S1 and S2 normal without gallop, click, rub or other extra sounds    Lungs:Chest clear to auscultation; no wheezes, rhonchi,rales ,or rubs present.No increased work of breathing.   Abdomen: bowel sounds normal, soft and non-tender without masses, organomegaly or hernias noted.  No guarding or rebound.   Vascular : all pulses equal ; no bruits present.  Skin:Warm & dry.  Intact without suspicious lesions or rashes ; no tenting or jaundice   Lymphatic: No lymphadenopathy is noted about the head, neck, axilla  Neuro: Strength, tone decreased        Assessment & Plan:  #1 diffuse spine pain in the context of multilevel spinal stenosis  #2 hoarseness with history of rhinitis as well as  reflux. Past history of  Vocal cord lesion  #3 ecchymosis without other bleeding dyscrasias  #4 bilateral hip pain; rule out polymyalgia rheumatica.  See orders and recommendations

## 2014-08-28 NOTE — Progress Notes (Signed)
Pre visit review using our clinic review tool, if applicable. No additional management support is needed unless otherwise documented below in the visit note. 

## 2014-08-29 LAB — CBC WITH DIFFERENTIAL/PLATELET
BASOS PCT: 0.7 % (ref 0.0–3.0)
Basophils Absolute: 0 10*3/uL (ref 0.0–0.1)
Eosinophils Absolute: 0.1 10*3/uL (ref 0.0–0.7)
Eosinophils Relative: 1.8 % (ref 0.0–5.0)
HEMATOCRIT: 38.1 % (ref 36.0–46.0)
HEMOGLOBIN: 12.9 g/dL (ref 12.0–15.0)
LYMPHS PCT: 22.5 % (ref 12.0–46.0)
Lymphs Abs: 1.4 10*3/uL (ref 0.7–4.0)
MCHC: 33.8 g/dL (ref 30.0–36.0)
MCV: 93.2 fl (ref 78.0–100.0)
Monocytes Absolute: 0.3 10*3/uL (ref 0.1–1.0)
Monocytes Relative: 4.8 % (ref 3.0–12.0)
Neutro Abs: 4.4 10*3/uL (ref 1.4–7.7)
Neutrophils Relative %: 70.2 % (ref 43.0–77.0)
Platelets: 189 10*3/uL (ref 150.0–400.0)
RBC: 4.09 Mil/uL (ref 3.87–5.11)
RDW: 14.1 % (ref 11.5–15.5)
WBC: 6.2 10*3/uL (ref 4.0–10.5)

## 2014-08-29 LAB — SEDIMENTATION RATE: SED RATE: 40 mm/h — AB (ref 0–22)

## 2014-08-29 LAB — TSH: TSH: 2.29 u[IU]/mL (ref 0.35–4.50)

## 2014-09-23 ENCOUNTER — Other Ambulatory Visit: Payer: Self-pay | Admitting: Internal Medicine

## 2014-09-24 ENCOUNTER — Other Ambulatory Visit: Payer: Self-pay | Admitting: Emergency Medicine

## 2014-09-24 MED ORDER — LEVOTHYROXINE SODIUM 25 MCG PO TABS: ORAL_TABLET | ORAL | Status: DC

## 2014-10-03 ENCOUNTER — Other Ambulatory Visit: Payer: Self-pay | Admitting: Internal Medicine

## 2014-10-04 ENCOUNTER — Other Ambulatory Visit: Payer: Self-pay | Admitting: Emergency Medicine

## 2014-10-04 MED ORDER — PAROXETINE HCL 20 MG PO TABS: 20.0000 mg | ORAL_TABLET | Freq: Every day | ORAL | Status: DC

## 2014-10-31 ENCOUNTER — Emergency Department (INDEPENDENT_AMBULATORY_CARE_PROVIDER_SITE_OTHER)
Admission: EM | Admit: 2014-10-31 | Discharge: 2014-10-31 | Disposition: A | Discharge status: Home / Self Care | Payer: Medicare Other | Source: Home / Self Care | Attending: Family Medicine | Admitting: Family Medicine

## 2014-10-31 ENCOUNTER — Encounter (HOSPITAL_COMMUNITY): Payer: Self-pay | Admitting: Emergency Medicine

## 2014-10-31 DIAGNOSIS — M62838 Other muscle spasm: Secondary | ICD-10-CM | POA: Diagnosis not present

## 2014-10-31 DIAGNOSIS — M436 Torticollis: Secondary | ICD-10-CM

## 2014-10-31 MED ORDER — METHOCARBAMOL 500 MG PO TABS
500.0000 mg | ORAL_TABLET | Freq: Four times a day (QID) | ORAL | Status: DC | PRN
Start: 2014-10-31 — End: 2015-02-05

## 2014-10-31 NOTE — ED Notes (Signed)
Pt c/o neck pain onset yest; denies inj/trauma Reports pain happened around 1400 - 1500 after using inhalers Pain increases w/activity Alert... No acute distress.

## 2014-10-31 NOTE — Discharge Instructions (Signed)
You are experiencing muscle spasms causing your neck to pull to one side. Please use ibuprofen 600mg  every 6 hours, and the robaxin as prescribed Please remember to force your neck to move in all directions. Please apply heat and massage as needed for additional relief.

## 2014-10-31 NOTE — ED Provider Notes (Signed)
CSN: [id number]     Arrival date & time 10/31/14  1655 History   First MD Initiated Contact with Patient 10/31/14 1742     Chief Complaint  Patient presents with  . Neck Pain   (Consider location/radiation/quality/duration/timing/severity/associated sxs/prior Treatment) HPI   Neck pain. Started 1 day ago. Occurred after breakfast adn use of inhaler. Pt stated she was gargling water and spit out water when pain started in neck.  Cramping of L neck muscles.  Worse w/ moving of L arm or trying to turn head to L.  Tylenol w/o improvement.  Non radiating Denies a change in voice, headache, fevers, chest pain, shortness breath, palpitations, dysphagia   Past Medical History  Diagnosis Date  . COPD (chronic obstructive pulmonary disease)   . Myopathy   . Left lumbar radiculopathy   . Hyperlipidemia   . Hypertension   . Osteoarthritis   . Hypothyroidism   . Anxiety   . Pneumonia 1/09    hx  . Shortness of breath   . Heart murmur   . History of recurrent UTIs   . GERD (gastroesophageal reflux disease)     occ  . Cancer     squamous cell rectal area   Past Surgical History  Procedure Laterality Date  . Uterine ablation      Dr Ubaldo Glassing  . Dilation and curettage of uterus    . Glaucoma surgery    . Colonoscopy with polypectomy    . Squamous cell cancer  resection      rectal; Dr Morton Stall , [facility]  . Cataract extraction, bilateral    . Microlaryngoscopy Right 04/26/2012    Procedure: MICROLARYNGOSCOPY WITH EXCISION OF VOCAL CORD LESION;  Surgeon: Izora Gala, MD;  Location: [place];  Service: ENT;  Laterality: Right;   Family History  Problem Relation Age of Onset  . Arthritis Mother   . Diabetes Mother   . Heart disease Mother     MI @ 93  . Kidney disease Father   . Heart disease Father     Rheumatic Heart Disease  . Asthma Sister   . Cancer Brother     brother breast cancer, melanoma  . Stroke Maternal Grandfather     ? age  . Stroke Paternal Grandfather 41   Social  History  Substance Use Topics  . Smoking status: Former Smoker -- 1.00 packs/day for 57 years    Types: Cigarettes    Quit date: 02/26/2007  . Smokeless tobacco: None     Comment: smoked age 32- 89 up to 1 ppd  occ alcohol  . Alcohol Use: Yes   OB History    No data available     Review of Systems Per HPI with all other pertinent systems negative.   Allergies  Diclofenac; Acetaminophen; Sulfa antibiotics; Ultram; Fluvastatin sodium; and Meperidine hcl  Home Medications   Prior to Admission medications   Medication Sig Start Date End Date Taking? Authorizing Provider  albuterol (PROVENTIL HFA;VENTOLIN HFA) 108 (90 BASE) MCG/ACT inhaler Inhale 2 puffs into the lungs every 6 (six) hours as needed for wheezing or shortness of breath. 05/04/13  Yes Elsie Stain, MD  amitriptyline (ELAVIL) 10 MG tablet Take 1 tablet (10 mg total) by mouth at bedtime. 08/28/14  Yes Hendricks Limes, MD  aspirin 81 MG tablet Take 81 mg by mouth daily.     Yes Historical Provider, MD  calcium carbonate (TUMS - DOSED IN MG ELEMENTAL CALCIUM) 500 MG chewable tablet Chew 1  tablet by mouth at bedtime as needed for heartburn.   Yes Historical Provider, MD  chlorthalidone (HYGROTON) 25 MG tablet TAKE ONE-HALF TABLET BY MOUTH ONCE DAILY. 07/30/14  Yes Hendricks Limes, MD  docusate sodium (COLACE) 100 MG capsule Take 100 mg by mouth daily as needed.    Yes Historical Provider, MD  FIBER PO Take 1 tablet by mouth daily as needed.    Yes Historical Provider, MD  levothyroxine (SYNTHROID, LEVOTHROID) 25 MCG tablet TAKE ONE TABLET BY MOUTH ONCE DAILY, Wed take 1.5 tablet 09/24/14  Yes Hendricks Limes, MD  metoprolol succinate (TOPROL-XL) 25 MG 24 hr tablet 1 tab by mouth daily 07/01/14  Yes Hendricks Limes, MD  PARoxetine (PAXIL) 20 MG tablet Take 1 tablet (20 mg total) by mouth daily. 10/04/14  Yes Hendricks Limes, MD  cimetidine (TAGAMET) 200 MG tablet Take 100 mg by mouth at bedtime as needed (for heartburn).     Historical Provider, MD  Dextromethorphan-Menthol (DELSYM COUGH RELIEF) 5-5 MG LOZG Use as directed 1 lozenge in the mouth or throat daily as needed (for tickle in throat).    Historical Provider, MD  Fluticasone-Salmeterol (ADVAIR DISKUS) 250-50 MCG/DOSE AEPB Inhale 1 puff into the lungs 2 (two) times daily. 06/28/14   Elsie Stain, MD  guaiFENesin (MUCINEX) 600 MG 12 hr tablet Take 600 mg by mouth daily.     Historical Provider, MD  hydroxypropyl methylcellulose (ISOPTO TEARS) 2.5 % ophthalmic solution Place 2 drops into both eyes 4 (four) times daily as needed (for dry eyes).    Historical Provider, MD  INCRUSE ELLIPTA 62.5 MCG/INH AEPB INHALE ONE PUFF ONCE DAILY 08/15/14   Elsie Stain, MD  ipratropium-albuterol (DUONEB) 0.5-2.5 (3) MG/3ML SOLN Take 3 mLs by nebulization every 4 (four) hours as needed (for shortness of breath). AS NEEDED    Historical Provider, MD  methocarbamol (ROBAXIN) 500 MG tablet Take 1-2 tablets (500-1,000 mg total) by mouth every 6 (six) hours as needed for muscle spasms. 10/31/14   Waldemar Dickens, MD  Throat Lozenges (HALLS COUGH DROPS) LOZG Use as directed 1 lozenge in the mouth or throat daily as needed (for tickle in throat).    Historical Provider, MD   Meds Ordered and Administered this Visit  Medications - No data to display  BP 130/74 mmHg  Pulse 75  Temp(Src) 97.4 F (36.3 C) (Oral)  Resp 16  SpO2 95% No data found.   Physical Exam Physical Exam  Constitutional: oriented to person, place, and time. appears well-developed and well-nourished. No distress.  HENT:  Head: Normocephalic and atraumatic.  Eyes: EOMI. PERRL.  Neck: Normal range of motion.  Cardiovascular: RRR, no m/r/g, 2+ distal pulses,  Pulmonary/Chest: Effort normal and breath sounds normal. No respiratory distress.  Abdominal: Soft. Bowel sounds are normal. NonTTP, no distension.  Musculoskeletal: pt will turn head to R and midline but no further due to pain. No bony abnormality  felt. Tight trapezius muslce on L. Neurological: alert and oriented to person, place, and time.  Skin: Skin is warm. No rash noted. non diaphoretic.  Psychiatric: normal mood and affect. behavior is normal. Judgment and thought content normal.   ED Course  Procedures (including critical care time)  Labs Review Labs Reviewed - No data to display  Imaging Review No results found.   Visual Acuity Review  Right Eye Distance:   Left Eye Distance:   Bilateral Distance:    Right Eye Near:   Left Eye Near:  Bilateral Near:         MDM   1. Muscle spasm   2. Torticollis, acquired    Renal function normal. Start ibuprofen. Has used in the past without allergic reaction as noted with diclofenac. Patient also start Robaxin, stressed the importance of neck exercises and the importance of not keeping the head to the right as this will only cause the problem to get worse. Discussed heat massage and exercises to help loosen the neck muscles. Good to the ED if not improving.    Waldemar Dickens, MD 10/31/14 [phone]

## 2014-11-15 ENCOUNTER — Other Ambulatory Visit: Payer: Self-pay | Admitting: Internal Medicine

## 2014-11-15 ENCOUNTER — Other Ambulatory Visit: Payer: Self-pay | Admitting: Critical Care Medicine

## 2014-11-21 ENCOUNTER — Other Ambulatory Visit: Payer: Self-pay

## 2014-11-21 MED ORDER — ALBUTEROL SULFATE HFA 108 (90 BASE) MCG/ACT IN AERS: 2.0000 | INHALATION_SPRAY | Freq: Four times a day (QID) | RESPIRATORY_TRACT | Status: DC | PRN

## 2014-11-26 ENCOUNTER — Telehealth: Payer: Self-pay | Admitting: Internal Medicine

## 2014-11-26 NOTE — Telephone Encounter (Signed)
[facility]     Patient Name: Olivia [name] - Client    Client Site [name]    Physician Unice Cobble     Contact Type Call  Gender: Female Call Type Triage / Clinical  DOB: 26-Jul-1931  Relationship To Patient Self  Age: 81 Y 34 M 20 D Return Phone Number [phone] (Primary)  Return Phone Number: [phone] (Primary) Chief Complaint Medication reaction  Address:  Initial Comment Caller states she started taking amitriptyline 1-2 months ago. When she takes it her lips and tongue go numb for about an hour.   City/State/Zip: [place]  PreDisposition Call Doctor    Nurse Assessment  Nurse: Amalia Hailey, RN, Melissa Date/Time (Eastern Time): 11/26/2014 4:23:23 PM  Confirm and document reason for call. If symptomatic, describe symptoms. ---Caller states she started taking amitriptyline 1-2 months ago. When she takes it her lips and tongue go numb for about an hour.  Has the patient traveled out of the country within the last 30 days? ---Not Applicable  Does the patient have any new or worsening symptoms? ---Yes  Will a triage be completed? ---Yes  Related visit to physician within the last 2 weeks? ---No  Does the PT have any chronic conditions? (i.e. diabetes, asthma, etc.) ---Yes  List chronic conditions. ---COPD, hypertension, hx of UTI's, back pain, neuropathy, Spiriva,    Guidelines      Guideline Title Affirmed Question Affirmed Notes Nurse Date/Time Eilene Ghazi Time)  Mouth Symptoms Weak immune system (e.g., HIV positive, cancer chemo, splenectomy, organ transplant, chronic steroids)  Amalia Hailey, RN, Melissa 11/26/2014 4:33:47 PM  Disp. Time Eilene Ghazi Time) Disposition Final User         11/26/2014 4:43:27 PM See PCP When Office is Open (within 3 days) Yes Amalia Hailey, RN, Lenna Sciara         Caller Understands: Yes   Disagree/Comply: Comply      Care Advice  Given Per Guideline         SEE PCP WITHIN 3 DAYS: * You need to be seen within 2 or 3 days. Call your doctor during regular office hours and make an appointment. An urgent care center is often the best source of care if your doctor's office is closed or you can't get an appointment. NOTE: If office will be open tomorrow, tell caller to call then, not in 3 days. CALL BACK IF: * You become worse.   After Care Instructions Given     Call Event Type User Date / Time Description

## 2014-11-29 ENCOUNTER — Ambulatory Visit: Payer: Self-pay | Admitting: Internal Medicine

## 2015-02-05 ENCOUNTER — Telehealth: Payer: Self-pay | Admitting: Internal Medicine

## 2015-02-05 ENCOUNTER — Encounter: Payer: Self-pay | Admitting: Internal Medicine

## 2015-02-05 ENCOUNTER — Ambulatory Visit (INDEPENDENT_AMBULATORY_CARE_PROVIDER_SITE_OTHER): Payer: Medicare Other | Admitting: Internal Medicine

## 2015-02-05 VITALS — BP 130/90 | HR 67 | Ht 60.5 in | Wt 158.4 lb

## 2015-02-05 DIAGNOSIS — J449 Chronic obstructive pulmonary disease, unspecified: Secondary | ICD-10-CM

## 2015-02-05 MED ORDER — TIOTROPIUM BROMIDE-OLODATEROL 2.5-2.5 MCG/ACT IN AERS: 2.0000 | INHALATION_SPRAY | Freq: Every day | RESPIRATORY_TRACT | Status: DC

## 2015-02-05 NOTE — Telephone Encounter (Signed)
Olivia [name], pt is to return to office in two weeks for f/u with MW. MW schedule is completely full. Where would you like pt scheduled?

## 2015-02-05 NOTE — Patient Instructions (Addendum)
Plan A = Stiolto 2 puffs each am  Work on inhaler technique:  relax and gently blow all the way out then take a nice smooth deep breath back in, triggering the inhaler at same time you start breathing in.  Hold for up to 5 seconds if you can. Blow out thru nose. Rinse and gargle with water when done      Plan B - Only use your albuterol (ventolin) as a rescue medication to be used if you can't catch your breath by resting or doing a relaxed purse lip breathing pattern.  - The less you use it, the better it will work when you need it. - Ok to use up to 2 puffs  every 4 hours if you must but call for immediate appointment if use goes up over your usual need - Don't leave home without it !!  (think of it like the spare tire for your car)   Plan C = crisis - Albuterol neb up to every 4 hours if you try your ventolin first and it doesn't work   GERD (REFLUX)  is an extremely common cause of respiratory symptoms just like yours , many times with no obvious heartburn at all.    It can be treated with medication, but also with lifestyle changes including elevation of the head of your bed (ideally with 6 inch  bed blocks),  Smoking cessation, avoidance of late meals, excessive alcohol, and avoid fatty foods, chocolate, peppermint, colas, red wine, and acidic juices such as orange juice.  NO MINT OR MENTHOL PRODUCTS SO NO COUGH DROPS  USE SUGARLESS CANDY INSTEAD (Jolley ranchers or Stover's or Life Savers) or even ice chips will also do - the key is to swallow to prevent all throat clearing. NO OIL BASED VITAMINS - use powdered substitutes.   Please schedule a follow up office visit in 2 weeks, sooner if needed with formulary in hand

## 2015-02-05 NOTE — Progress Notes (Signed)
Subjective:   Patient ID: Olivia [name], female    DOB: 02/20/1932  .   MRN: [id number]   Brief patient profile: 4 yowf quit smoking 2009 previously followed in pulmonary clinic by [name] with  Dx copd with Spirometry  02/04/2012  C/w GOLD III criteria though f/v loop not typical.  History of Present Illness  07/03/2014 Dr [name] final note  Chief Complaint  Patient presents with  . Follow-up    Pt c/o hoarness started after taking incruise, SOB, dry cough x 1 month.   Pt hoarse with incruse dpi.  Pt remains dyspnea on exertion.    rec Prevnar 13 was given Stay on Incruse and Advair  Use albuterol as needed    02/05/2015  Extended transition of care f/u ov/Olivia [name] re: establish care for copd Chief Complaint  Patient presents with  . Follow-up    Former pt of [name]. Pt still c/o hoarseness- relates to taking incruse. She has had increased cough for the past few days- non prod.  She is having some increased SOB today, and had to use ventolin for the first time in a month,   chronic  Doe x really winded when returns from mb to house  ok at rest/ main concern is chronic hoarseness and variable severe dry cough day >>noct  No obvious day to day or daytime variability or assoc  cp or chest tightness, subjective wheeze or overt sinus or hb symptoms. No unusual exp hx or h/o childhood pna/ asthma or knowledge of premature birth.  Sleeping ok without nocturnal  or early am exacerbation  of respiratory  c/o's or need for noct saba. Also denies any obvious fluctuation of symptoms with weather or environmental changes or other aggravating or alleviating factors except as outlined above   Current Medications, Allergies, Complete Past Medical History, Past Surgical History, Family History, and Social History were reviewed in [employer] record.  ROS  The following are not active complaints unless bolded sore throat, dysphagia, dental problems, itching, sneezing,   nasal congestion or excess/ purulent secretions, ear ache,   fever, chills, sweats, unintended wt loss, classically pleuritic or exertional cp, hemoptysis,  orthopnea pnd or leg swelling, presyncope, palpitations, abdominal pain, anorexia, nausea, vomiting, diarrhea  or change in bowel or bladder habits, change in stools or urine, dysuria,hematuria,  rash, arthralgias, visual complaints, headache, numbness, weakness or ataxia or problems with walking or coordination,  change in mood/affect or memory.           Objective:   Physical Exam   amb wf extremely hoarse, somewhat argumentative but nad  Wt Readings from Last 3 Encounters:  02/05/15 158 lb 6.4 oz (71.85 kg)  08/28/14 152 lb (68.947 kg)  07/03/14 153 lb 6.4 oz (69.582 kg)    Vital signs reviewed   HEENT: nl dentition, turbinates, and oropharynx. Nl external ear canals without cough reflex   NECK :  without JVD/Nodes/TM/ nl carotid upstrokes bilaterally   LUNGS: no acc muscle use,  Minimal barrel  contour chest which is clear to A and P bilaterally without cough on insp or exp maneuvers though bs distant s wheeze    CV:  RRR  no s3 or murmur or increase in P2, no edema   ABD:  soft and nontender with nl inspiratory excursion in the supine position. No bruits or organomegaly, bowel sounds nl  MS:  Nl gait/ ext warm without deformities, calf tenderness, cyanosis or clubbing No obvious joint  restrictions   SKIN: warm and dry without lesions    NEURO:  alert, approp though very stubborn affect/ continually interupting eval to state what she's read and heard in past re her copd/  nl sensorium with  no motor deficits       I personally reviewed images and agree with radiology impression as follows:  CXR:  07/03/14 Hyperinflation/ COPD. Cardiomegaly without congestive failure. Atherosclerosis. Small to moderate hiatal hernia. This is new or increased since the prior exam.         Assessment & Plan:

## 2015-02-06 NOTE — Assessment & Plan Note (Addendum)
Spirometry 02/04/2012:   FEV1 1.01(48%) ratio 48 with atypical features in the effort dep portion  - 02/05/2015  extensive coaching HFA effectiveness =    75% with respimat > try stiolto   Symptoms poorly controlled on multiple inhalers with increase need for saba hfa.  DDX of  difficult airways management all start with A and  include Adherence, Ace Inhibitors, Acid Reflux, Active Sinus Disease, Alpha 1 Antitripsin deficiency, Anxiety masquerading as Airways dz,  ABPA,  allergy(esp in young), Aspiration (esp in elderly), Adverse effects of meds,  Active smokers, A bunch of PE's (a small clot burden can't cause this syndrome unless there is already severe underlying pulm or vascular dz with poor reserve) plus two Bs  = Bronchiectasis and Beta blocker use..and one C= CHF   Adherence is always the initial "prime suspect" and is a multilayered concern that requires a "trust but verify" approach in every patient - starting with knowing how to use medications, especially inhalers, correctly, keeping up with refills and understanding the fundamental difference between maintenance and prns vs those medications only taken for a very short course and then stopped and not refilled.  - - The proper method of use, as well as anticipated side effects, of a metered-dose inhaler are discussed and demonstrated to the patient. Improved effectiveness after extensive coaching during this visit to a level of approximately 75 % from a baseline of 25 % > try stiolto 2 each am x 2 week sample  ? Adverse effects of dpi with hoarseness > try off all dpi  ? Acid (or non-acid) GERD > always difficult to exclude as up to 75% of pts in some series report no assoc GI/ Heartburn symptoms> rec max  diet restrictions/ reviewed and consider trial of max acid suppression next ov    I had an extended discussion with the patient reviewing all relevant studies completed to date and  Lasting 25 minutes of a40 minute transition of care  office visit    Each maintenance medication was reviewed in detail including most importantly the difference between maintenance and prns and under what circumstances the prns are to be triggered using an action plan format that is not reflected in the computer generated alphabetically organized AVS.    Please see instructions for details which were reviewed in writing and the patient given a copy highlighting the part that I personally wrote and discussed at today's ov.

## 2015-02-07 NOTE — Telephone Encounter (Signed)
lmtcb x1 for pt. 

## 2015-02-10 NOTE — Telephone Encounter (Signed)
lmomtcb for pt 

## 2015-02-10 NOTE — Telephone Encounter (Signed)
Whenever works for the pt is fine by me

## 2015-02-10 NOTE — Telephone Encounter (Signed)
Scheduled appointment for patient with her daughter.  [place] close.

## 2015-02-10 NOTE — Telephone Encounter (Signed)
LMTCB x 2  Magda Paganini please specify which day next week would be best to schedule this patient as Dr Melvyn Novas is already double booked.

## 2015-02-19 ENCOUNTER — Ambulatory Visit: Payer: Medicare Other | Admitting: Internal Medicine

## 2015-02-25 ENCOUNTER — Telehealth: Payer: Self-pay | Admitting: Internal Medicine

## 2015-02-25 NOTE — Telephone Encounter (Signed)
Called and spoke to pt's daughter, Benjamine Mola. Pt is out of her stiolto sample for a few days and was requesting another sample. Vear Clock that we do not have any Stiolto samples at this time. Benjamine Mola stated the pt has been doing very well on the [employer] but insurance does not cover the medication. Pt was advised to f/u in 2 weeks per last OV but the 2 week OV was cancelled. Appt made with MW on 1.5.2016. Benjamine Mola verbalized understanding and denied any further questions or concerns at this time.

## 2015-02-27 ENCOUNTER — Ambulatory Visit (INDEPENDENT_AMBULATORY_CARE_PROVIDER_SITE_OTHER): Payer: Medicare Other | Admitting: Internal Medicine

## 2015-02-27 ENCOUNTER — Encounter: Payer: Self-pay | Admitting: Internal Medicine

## 2015-02-27 VITALS — BP 126/80 | HR 64 | Ht 60.5 in | Wt 155.8 lb

## 2015-02-27 DIAGNOSIS — J449 Chronic obstructive pulmonary disease, unspecified: Secondary | ICD-10-CM | POA: Diagnosis not present

## 2015-02-27 MED ORDER — ALBUTEROL SULFATE (2.5 MG/3ML) 0.083% IN NEBU: 2.5000 mg | INHALATION_SOLUTION | RESPIRATORY_TRACT | Status: DC | PRN

## 2015-02-27 NOTE — Patient Instructions (Addendum)
Plan A =  Automatic Stiolto 2 puffs each am   (if they won't cover it the other option is BEVESPI up to 2 puffs every 12 hours and the other option is nebulized formoterol/pulmicort thru part B medicare)    Work on inhaler technique:  relax and gently blow all the way out then take a nice smooth deep breath back in, triggering the inhaler at same time you start breathing in.  Hold for up to 5 seconds if you can. Blow out thru nose. Rinse and gargle with water when done      Plan B = Backup  - Only use your albuterol (ventolin) as a rescue medication to be used if you can't catch your breath by resting or doing a relaxed purse lip breathing pattern.  - The less you use it, the better it will work when you need it. - Ok to use up to 2 puffs  every 4 hours if you must but call for immediate appointment if use goes up over your usual need - Don't leave home without it !!  (think of it like the spare tire for your car)   Plan C = crisis - Albuterol neb up to every 4 hours if you try your ventolin first and it doesn't work  Please schedule a follow up visit in 3 months but call sooner if needed  - FAX is [place]

## 2015-02-27 NOTE — Progress Notes (Signed)
Subjective:   Patient ID: Olivia [name], female    DOB: 05/17/1931  .   MRN: [id number]   Brief patient profile: 70 yowf quit smoking 2009 previously followed in pulmonary clinic by [name] with  Dx copd with Spirometry  02/04/2012  C/w GOLD III criteria though f/v loop not typical.  History of Present Illness  07/03/2014 Dr [name] final note  Chief Complaint  Patient presents with  . Follow-up    Pt c/o hoarness started after taking incruise, SOB, dry cough x 1 month.   Pt hoarse with incruse dpi.  Pt remains dyspnea on exertion.    rec Prevnar 13 was given Stay on Incruse and Advair  Use albuterol as needed    02/05/2015  Extended transition of care f/u ov/Anayiah Howden re: establish care for copd Chief Complaint  Patient presents with  . Follow-up    Former pt of [name]. Pt still c/o hoarseness- relates to taking incruse. She has had increased cough for the past few days- non prod.  She is having some increased SOB today, and had to use ventolin for the first time in a month,   chronic  Doe x really winded when returns from mb to house  ok at rest/ main concern is chronic hoarseness and variable severe dry cough day >>noct rec Plan A = Stiolto 2 puffs each am Plan B - Only use your albuterol (ventolin) as a rescue medication  Plan C = crisis - Albuterol neb up to every 4 hours if you try your ventolin first and it doesn't work   02/27/2015  f/u ov/Anmol Paschen re: GOLD III with atypical f/v and upper airway component  Chief Complaint  Patient presents with  . Follow-up    Pt states she did well on stiolto- was coughing less and not as hoarse. She ran out of med after 2 wks and now symptoms are coming back, but are less severe.      doe = MMRC2 = can't walk a nl pace on a flat grade s sob/ rncrease need for saba p changed back to incruse    No obvious day to day or daytime variability or assoc excess/ purulent sputum or mucus plugs     cp or chest tightness, subjective wheeze or  overt sinus or hb symptoms. No unusual exp hx or h/o childhood pna/ asthma or knowledge of premature birth.  Sleeping ok without nocturnal  or early am exacerbation  of respiratory  c/o's or need for noct saba. Also denies any obvious fluctuation of symptoms with weather or environmental changes or other aggravating or alleviating factors except as outlined above   Current Medications, Allergies, Complete Past Medical History, Past Surgical History, Family History, and Social History were reviewed in [employer] record.  ROS  The following are not active complaints unless bolded sore throat, dysphagia, dental problems, itching, sneezing,  nasal congestion or excess/ purulent secretions, ear ache,   fever, chills, sweats, unintended wt loss, classically pleuritic or exertional cp, hemoptysis,  orthopnea pnd or leg swelling, presyncope, palpitations, abdominal pain, anorexia, nausea, vomiting, diarrhea  or change in bowel or bladder habits, change in stools or urine, dysuria,hematuria,  rash, arthralgias, visual complaints, headache, numbness, weakness or ataxia or problems with walking or coordination,  change in mood/affect or memory.           Objective:   Physical Exam   amb wf moderately hoarse    02/27/2015  156   02/05/15 158 lb 6.4 oz (71.85 kg)  08/28/14 152 lb (68.947 kg)  07/03/14 153 lb 6.4 oz (69.582 kg)    Vital signs reviewed   HEENT: nl dentition, turbinates, and oropharynx. Nl external ear canals without cough reflex   NECK :  without JVD/Nodes/TM/ nl carotid upstrokes bilaterally   LUNGS: no acc muscle use,  Minimal barrel  contour chest which is clear to A and P bilaterally without cough on insp or exp maneuvers though bs distant s wheeze    CV:  RRR  no s3 or murmur or increase in P2, no edema   ABD:  soft and nontender with nl inspiratory excursion in the supine position. No bruits or organomegaly, bowel sounds nl  MS:  Nl gait/ ext  warm without deformities, calf tenderness, cyanosis or clubbing No obvious joint restrictions   SKIN: warm and dry without lesions    NEURO:  alert, approp though very stubborn affect/ continually interupting eval to state what she's read and heard in past re her copd/  nl sensorium with  no motor deficits       I personally reviewed images and agree with radiology impression as follows:  CXR:  07/03/14 Hyperinflation/ COPD. Cardiomegaly without congestive failure. Atherosclerosis. Small to moderate hiatal hernia. This is new or increased since the prior exam.         Assessment & Plan:

## 2015-03-03 NOTE — Assessment & Plan Note (Signed)
Spirometry 02/04/2012:   FEV1 1.01(48%) ratio 48 with atypical features in the effort dep portion  - 02/05/2015  extensive coaching HFA effectiveness =    75% with respimat > try stiolto > improved but insurance wouldn't pay > worse 02/27/2015 back on incruse > samples given and PA forms requested   I had an extended discussion with the patient reviewing all relevant studies completed to date and  lasting 15 to 20 minutes of a 25 minute visit    Formulary restrictions will be an ongoing challenge for the forseable future and I would be happy to pick an alternative if the pt will first  provide me a list of them but pt  will need to return here for training for any new device that is required eg dpi vs hfa vs respimat.    In meantime we can always provide samples so the patient never runs out of any needed respiratory medications.   Each maintenance medication was reviewed in detail including most importantly the difference between maintenance and prns and under what circumstances the prns are to be triggered using an action plan format that is not reflected in the computer generated alphabetically organized AVS.    Please see instructions for details which were reviewed in writing and the patient given a copy highlighting the part that I personally wrote and discussed at today's ov.

## 2015-03-11 ENCOUNTER — Other Ambulatory Visit: Payer: Self-pay | Admitting: Internal Medicine

## 2015-03-11 ENCOUNTER — Telehealth: Payer: Self-pay | Admitting: Internal Medicine

## 2015-03-11 MED ORDER — GLYCOPYRROLATE-FORMOTEROL 9-4.8 MCG/ACT IN AERO: 2.0000 | INHALATION_SPRAY | Freq: Two times a day (BID) | RESPIRATORY_TRACT | Status: DC

## 2015-03-11 NOTE — Telephone Encounter (Signed)
That's fine bivespi is Take 2 puffs first thing in am and then another 2 puffs about 12 hours later.

## 2015-03-11 NOTE — Telephone Encounter (Signed)
Called spoke with patient who reported the Stiolto Respimat is working very well for her - her voice is much better.  However, he insurance will not cover this medication.  She reported that she called her insurance yesterday and they will cover [place].  Pt is okay with call back tomorrow - she has about 6 doses of the the Stiolto left Dr Melvyn Novas please advise if appropriate to change pt to [facility], thank you.

## 2015-03-11 NOTE — Telephone Encounter (Signed)
Spoke with pt, aware of recs.  rx sent to pharmacy.  Nothing further needed.  

## 2015-03-12 NOTE — Telephone Encounter (Signed)
i have called and left message advising patient to call me back to decide which pcp she would now like to see--i have synthroid rx request, but i cannot send rx in until we know which pcp she wants to see, also need to schedule appt with new pcp to have tsh labs and visit---can talk with Tetsuo Coppola

## 2015-03-13 ENCOUNTER — Other Ambulatory Visit: Payer: Self-pay | Admitting: Internal Medicine

## 2015-03-13 NOTE — Telephone Encounter (Signed)
Pt called to check up on this request. Pt has an appt with Dr. Quay Burow on 05/05/15. Please call pt back

## 2015-04-11 ENCOUNTER — Other Ambulatory Visit: Payer: Self-pay | Admitting: Internal Medicine

## 2015-05-05 ENCOUNTER — Ambulatory Visit: Payer: Medicare Other | Admitting: Internal Medicine

## 2015-05-13 ENCOUNTER — Other Ambulatory Visit: Payer: Self-pay | Admitting: Internal Medicine

## 2015-05-28 ENCOUNTER — Ambulatory Visit (INDEPENDENT_AMBULATORY_CARE_PROVIDER_SITE_OTHER): Payer: Medicare Other | Admitting: Internal Medicine

## 2015-05-28 ENCOUNTER — Encounter: Payer: Self-pay | Admitting: Internal Medicine

## 2015-05-28 VITALS — BP 106/70 | HR 60 | Ht 61.0 in | Wt 153.0 lb

## 2015-05-28 DIAGNOSIS — R05 Cough: Secondary | ICD-10-CM

## 2015-05-28 DIAGNOSIS — R058 Other specified cough: Secondary | ICD-10-CM

## 2015-05-28 DIAGNOSIS — J449 Chronic obstructive pulmonary disease, unspecified: Secondary | ICD-10-CM | POA: Diagnosis not present

## 2015-05-28 NOTE — Progress Notes (Signed)
Subjective:   Patient ID: Olivia [name], female    DOB: 12-Dec-1931  .   MRN: [id number]   Brief patient profile: 63 yowf quit smoking 2009 previously followed in pulmonary clinic by Dr Olivia [name] with  Dx copd with Spirometry  02/04/2012  C/w GOLD III criteria though f/v loop not typical.  History of Present Illness  07/03/2014 Dr Olivia [name] final note  Chief Complaint  Patient presents with  . Follow-up    Pt c/o hoarness started after taking incruise, SOB, dry cough x 1 month.   Pt hoarse with incruse dpi.  Pt remains dyspnea on exertion.    rec Prevnar 13 was given Stay on Incruse and Advair  Use albuterol as needed    02/05/2015  Extended transition of care f/u ov/Olivia [name] re: establish care for copd Chief Complaint  Patient presents with  . Follow-up    Former pt of Dr Olivia [name]. Pt still c/o hoarseness- relates to taking incruse. She has had increased cough for the past few days- non prod.  She is having some increased SOB today, and had to use ventolin for the first time in a month,   chronic  Doe x really winded when returns from mb to house  ok at rest/ main concern is chronic hoarseness and variable severe dry cough day >>noct rec Plan A = Stiolto 2 puffs each am Plan B - Only use your albuterol (ventolin) as a rescue medication  Plan C = crisis - Albuterol neb up to every 4 hours if you try your ventolin first and it doesn't work   02/27/2015  f/u ov/Olivia [name] re: GOLD III with atypical f/v and upper airway component  Chief Complaint  Patient presents with  . Follow-up    Pt states she did well on stiolto- was coughing less and not as hoarse. She ran out of med after 2 wks and now symptoms are coming back, but are less severe.    doe = MMRC2 = can't walk a nl pace on a flat grade s sob/ rncrease need for saba p changed back to incruse rec Plan A =  Automatic Stiolto 2 puffs each am   (if they won't cover it the other option is BEVESPI up to 2 puffs every 12 hours and the other  option is nebulized formoterol/pulmicort thru part B medicare)    Plan B = Backup  - Only use your albuterol (ventolin) as a rescue medication Plan C = crisis - Albuterol neb up to every 4 hours if you try your ventolin first and it doesn't work    05/28/2015  f/u ov/Olivia [name] re: GOLD III / maint rx bevespi though hfa not ideal  Chief Complaint  Patient presents with  . Follow-up    Cough has improved some, but still throat clearing often.  Her hoarseness has resolved. She has only used rescue inhaler x 1 since the last visit and has not needed neb.   still doe = MMRC2 = can't walk a nl pace on a flat grade s sob but does fine slow and flat eg HT or WM or parking lot, but overall better tol   still urge to clear throat day >> noct or early am  No obvious day to day or daytime variability or assoc excess/ purulent sputum or mucus plugs     cp or chest tightness, subjective wheeze or overt sinus or hb symptoms. No unusual exp hx or h/o childhood pna/ asthma or knowledge of premature birth.  Sleeping ok without nocturnal  or early am exacerbation  of respiratory  c/o's or need for noct saba. Also denies any obvious fluctuation of symptoms with weather or environmental changes or other aggravating or alleviating factors except as outlined above   Current Medications, Allergies, Complete Past Medical History, Past Surgical History, Family History, and Social History were reviewed in [employer] record.  ROS  The following are not active complaints unless bolded sore throat, dysphagia, dental problems, itching, sneezing,  nasal congestion or excess/ purulent secretions, ear ache,   fever, chills, sweats, unintended wt loss, classically pleuritic or exertional cp, hemoptysis,  orthopnea pnd or leg swelling, presyncope, palpitations, abdominal pain, anorexia, nausea, vomiting, diarrhea  or change in bowel or bladder habits, change in stools or urine, dysuria,hematuria,  rash,  arthralgias, visual complaints, headache, numbness, weakness or ataxia or problems with walking or coordination,  change in mood/affect or memory.           Objective:   Physical Exam   amb wf  Mildly hoarse   05/28/2015          153   02/27/2015         156   02/05/15 158 lb 6.4 oz (71.85 kg)  08/28/14 152 lb (68.947 kg)  07/03/14 153 lb 6.4 oz (69.582 kg)    Vital signs reviewed   HEENT: nl dentition, turbinates, and oropharynx. Nl external ear canals without cough reflex   NECK :  without JVD/Nodes/TM/ nl carotid upstrokes bilaterally   LUNGS: no acc muscle use,  Minimal barrel  contour chest which is clear to A and P bilaterally without cough on insp or exp maneuvers though bs distant s wheeze    CV:  RRR  no s3 or murmur or increase in P2, no edema   ABD:  soft and nontender with nl inspiratory excursion in the supine position. No bruits or organomegaly, bowel sounds nl  MS:  Nl gait/ ext warm without deformities, calf tenderness, cyanosis or clubbing No obvious joint restrictions   SKIN: warm and dry without lesions    NEURO:  alert, approp though very stubborn affect/ continually interupting eval to state what she's read and heard in past re her copd/  nl sensorium with  no motor deficits       I personally reviewed images and agree with radiology impression as follows:  CXR:  07/03/14 Hyperinflation/ COPD. Cardiomegaly without congestive failure. Atherosclerosis. Small to moderate hiatal hernia. This is new or increased since the prior exam.         Assessment & Plan:

## 2015-05-28 NOTE — Patient Instructions (Signed)
Work on inhaler technique:  relax and gently blow all the way out then take a nice smooth deep breath back in, triggering the inhaler at same time you start breathing in.  Hold for up to 5 seconds if you can. Blow out thru nose. Rinse and gargle with water when done  Try prilosec(omeprazole) otc 20mg   Take 30-60 min before first meal of the day and tagamet (cimetidine) 400  Mg @  bedtime until cough is completely gone for at least a week without the need for cough suppression  GERD (REFLUX)  is an extremely common cause of respiratory symptoms just like yours , many times with no obvious heartburn at all.    It can be treated with medication, but also with lifestyle changes including elevation of the head of your bed (ideally with 6 inch  bed blocks),  Smoking cessation, avoidance of late meals, excessive alcohol, and avoid fatty foods, chocolate, peppermint, colas, red wine, and acidic juices such as orange juice.  NO MINT OR MENTHOL PRODUCTS SO NO COUGH DROPS  USE SUGARLESS CANDY INSTEAD (Jolley ranchers or Stover's or Life Savers) or even ice chips will also do - the key is to swallow to prevent all throat clearing. NO OIL BASED VITAMINS - use powdered substitutes.   Continue Bivespi Take 2 puffs first thing in am and then another 2 puffs about 12 hours later.     Please schedule a follow up office visit in 6 weeks, call sooner if needed

## 2015-05-29 DIAGNOSIS — R058 Other specified cough: Secondary | ICD-10-CM | POA: Insufficient documentation

## 2015-05-29 DIAGNOSIS — R05 Cough: Secondary | ICD-10-CM | POA: Insufficient documentation

## 2015-05-29 NOTE — Assessment & Plan Note (Signed)
Classic Upper airway cough syndrome, so named because it's frequently impossible to sort out how much is  CR/sinusitis with freq throat clearing (which can be related to primary GERD)   vs  causing  secondary (" extra esophageal")  GERD from wide swings in gastric pressure that occur with throat clearing, often  promoting self use of mint and menthol lozenges that reduce the lower esophageal sphincter tone and exacerbate the problem further in a cyclical fashion.   These are the same pts (now being labeled as having "irritable larynx syndrome" by some cough centers) who not infrequently have a history of having failed to tolerate ace inhibitors,  dry powder inhalers or biphosphonates or report having atypical reflux symptoms that don't respond to standard doses of PPI , and are easily confused as having aecopd or asthma flares by even experienced allergists/ pulmonologists.   Will try max gerd rx including using of sugarless hard rock candy to elp her quit clearing her throat and consider low dose gabapentin if this fails and she's bothered enough by it to warrant a trial

## 2015-05-29 NOTE — Assessment & Plan Note (Signed)
Spirometry 02/04/2012:   FEV1 1.01(48%) ratio 48 with atypical features in the effort dep portion  - 02/05/2015  extensive coaching HFA effectiveness =    75% with respimat > try stiolto > improved but insurance wouldn't pay  - 03/11/15 changed to [place] due to insurance  - 05/28/2015  extensive coaching HFA effectiveness =    75% from a baseline of 50%  Overall doing better considering  has severe underlying copd but not change in rx needed for now  I had an extended discussion with the patient reviewing all relevant studies completed to date and  lasting 15 to 20 minutes of a 25 minute visit    Each maintenance medication was reviewed in detail including most importantly the difference between maintenance and prns and under what circumstances the prns are to be triggered using an action plan format that is not reflected in the computer generated alphabetically organized AVS.    Please see instructions for details which were reviewed in writing and the patient given a copy highlighting the part that I personally wrote and discussed at today's ov.

## 2015-06-15 ENCOUNTER — Other Ambulatory Visit: Payer: Self-pay | Admitting: Internal Medicine

## 2015-07-11 ENCOUNTER — Ambulatory Visit (INDEPENDENT_AMBULATORY_CARE_PROVIDER_SITE_OTHER): Payer: Medicare Other | Admitting: Internal Medicine

## 2015-07-11 ENCOUNTER — Encounter: Payer: Self-pay | Admitting: Internal Medicine

## 2015-07-11 ENCOUNTER — Other Ambulatory Visit (INDEPENDENT_AMBULATORY_CARE_PROVIDER_SITE_OTHER): Payer: Medicare Other

## 2015-07-11 ENCOUNTER — Ambulatory Visit: Payer: Medicare Other | Admitting: Internal Medicine

## 2015-07-11 VITALS — BP 122/60 | HR 57 | Temp 97.8°F | Ht 61.0 in | Wt 150.2 lb

## 2015-07-11 DIAGNOSIS — M4806 Spinal stenosis, lumbar region: Secondary | ICD-10-CM | POA: Diagnosis not present

## 2015-07-11 DIAGNOSIS — E039 Hypothyroidism, unspecified: Secondary | ICD-10-CM | POA: Diagnosis not present

## 2015-07-11 DIAGNOSIS — G479 Sleep disorder, unspecified: Secondary | ICD-10-CM

## 2015-07-11 DIAGNOSIS — I10 Essential (primary) hypertension: Secondary | ICD-10-CM

## 2015-07-11 DIAGNOSIS — R413 Other amnesia: Secondary | ICD-10-CM

## 2015-07-11 DIAGNOSIS — M48061 Spinal stenosis, lumbar region without neurogenic claudication: Secondary | ICD-10-CM

## 2015-07-11 DIAGNOSIS — R0600 Dyspnea, unspecified: Secondary | ICD-10-CM | POA: Insufficient documentation

## 2015-07-11 LAB — CBC WITH DIFFERENTIAL/PLATELET
BASOS ABS: 0 10*3/uL (ref 0.0–0.1)
Basophils Relative: 0.7 % (ref 0.0–3.0)
EOS PCT: 2.8 % (ref 0.0–5.0)
Eosinophils Absolute: 0.2 10*3/uL (ref 0.0–0.7)
HEMATOCRIT: 38.7 % (ref 36.0–46.0)
Hemoglobin: 13.1 g/dL (ref 12.0–15.0)
LYMPHS PCT: 28.8 % (ref 12.0–46.0)
Lymphs Abs: 1.8 10*3/uL (ref 0.7–4.0)
MCHC: 33.8 g/dL (ref 30.0–36.0)
MCV: 90.7 fl (ref 78.0–100.0)
MONOS PCT: 10.5 % (ref 3.0–12.0)
Monocytes Absolute: 0.6 10*3/uL (ref 0.1–1.0)
Neutro Abs: 3.5 10*3/uL (ref 1.4–7.7)
Neutrophils Relative %: 57.2 % (ref 43.0–77.0)
Platelets: 200 10*3/uL (ref 150.0–400.0)
RBC: 4.27 Mil/uL (ref 3.87–5.11)
RDW: 14.5 % (ref 11.5–15.5)
WBC: 6.1 10*3/uL (ref 4.0–10.5)

## 2015-07-11 LAB — COMPREHENSIVE METABOLIC PANEL
ALBUMIN: 3.9 g/dL (ref 3.5–5.2)
ALK PHOS: 96 U/L (ref 39–117)
ALT: 14 U/L (ref 0–35)
AST: 27 U/L (ref 0–37)
BILIRUBIN TOTAL: 0.6 mg/dL (ref 0.2–1.2)
BUN: 23 mg/dL (ref 6–23)
CALCIUM: 9.2 mg/dL (ref 8.4–10.5)
CO2: 28 meq/L (ref 19–32)
CREATININE: 1.24 mg/dL — AB (ref 0.40–1.20)
Chloride: 103 mEq/L (ref 96–112)
GFR: 44 mL/min — ABNORMAL LOW (ref >60.00)
Glucose, Bld: 92 mg/dL (ref 70–99)
Potassium: 3.5 mEq/L (ref 3.5–5.1)
Sodium: 138 mEq/L (ref 135–145)
TOTAL PROTEIN: 7.3 g/dL (ref 6.0–8.3)

## 2015-07-11 LAB — TSH: TSH: 5.36 u[IU]/mL — ABNORMAL HIGH (ref 0.35–4.50)

## 2015-07-11 LAB — VITAMIN B12: VITAMIN B 12: 227 pg/mL (ref 211–911)

## 2015-07-11 MED ORDER — METOPROLOL SUCCINATE ER 25 MG PO TB24: ORAL_TABLET | ORAL | Status: AC

## 2015-07-11 NOTE — Progress Notes (Signed)
Subjective:    Patient ID: Olivia [name], female    DOB: 08-Jun-1931, 82 y.o.   MRN: [id number]  HPI She is here to establish with a new pcp.  She is here for follow up.  Spinal stenosis:  She has chronic lower back pain and both hips hurt.  She does not take anything for her pain.  She would like some pain medication so she can do more. She was getting injections in the past and it helped.    Hypothyroidism:  She is taking her medication daily.  She denies any recent changes in energy or weight that are unexplained.   COPD:  She is following with pulmonary.  She is using her inhalers.  She feels shortness of breath daily.  She also has some coughing and wheeze.  She denies any cold symptoms or fevers.   Hypertension: She is taking her medication daily. She is compliant with a low sodium diet.  She denies chest pain, palpitations, edema, shortness of breath and regular headaches. She is not exercising regularly.  She does not monitor her blood pressure at home.    Memory issues:  She does have some memory issues.  She was told it was because at one point her brain did not get enough oxygen.  She is unsure if she would want to go on medication that [place] help.   She is sleeping from daybreak to noon daily.  She is obsessed with the news and watches the news all night.  She does not want to miss anything.    Medications and allergies reviewed with patient and updated if appropriate.  Patient Active Problem List   Diagnosis Date Noted  . Upper airway cough syndrome 05/29/2015  . Hypokalemia 11/23/2013  . Spinal stenosis of lumbar region 11/21/2013  . Elevated serum creatinine 09/15/2013  . Abnormal ECG 03/10/2012  . Chest pressure 03/10/2012  . Hypothyroidism 01/11/2012  . Other abnormal glucose 08/24/2011  . MOOD SWINGS 03/19/2009  . ANXIETY STATE, UNSPECIFIED 01/02/2009  . Essential hypertension 01/02/2009  . ELECTROCARDIOGRAM, ABNORMAL 01/02/2009  . TINNITUS, LEFT 04/09/2008  .  NEUROPATHY 12/07/2007  . SLEEP DISORDER 12/07/2007  . MEMORY LOSS 12/07/2007  . CARCINOMA IN SITU OF SKIN SITE UNSPECIFIED 10/17/2007  . PORTAL HYPERTENSION 10/17/2007  . EXTERNAL HEMORRHOIDS 10/16/2007  . COLONIC POLYPS, HYPERPLASTIC, HX OF 10/16/2007  . MALIG NEOPLASM CONNECTIVE&OTH SOFT TISSUE PELVIS 09/19/2007  . COPD GOLD III with atypical upper airway features  03/07/2007  . HYPERLIPIDEMIA NEC/NOS 10/25/2006  . MYOPATHY NOS 10/25/2006  . LUMBAR RADICULOPATHY, LEFT 10/25/2006    Current Outpatient Prescriptions on File Prior to Visit  Medication Sig Dispense Refill  . albuterol (PROVENTIL HFA;VENTOLIN HFA) 108 (90 BASE) MCG/ACT inhaler Inhale 2 puffs into the lungs every 6 (six) hours as needed for wheezing or shortness of breath. 1 Inhaler 6  . albuterol (PROVENTIL) (2.5 MG/3ML) 0.083% nebulizer solution Take 3 mLs (2.5 mg total) by nebulization every 4 (four) hours as needed for wheezing or shortness of breath. 75 mL 12  . aspirin 81 MG tablet Take 81 mg by mouth daily. Reported on 05/28/2015    . calcium carbonate (TUMS - DOSED IN MG ELEMENTAL CALCIUM) 500 MG chewable tablet Chew 1 tablet by mouth at bedtime as needed for heartburn.    . chlorthalidone (HYGROTON) 25 MG tablet TAKE ONE-HALF TABLET BY MOUTH ONCE DAILY. 45 tablet 5  . cimetidine (TAGAMET) 200 MG tablet Take 100 mg by mouth at bedtime as needed (  for heartburn).    . docusate sodium (COLACE) 100 MG capsule Take 100 mg by mouth daily as needed.     [name] FIBER PO Take 1 tablet by mouth daily as needed.     . Glycopyrrolate-Formoterol (BEVESPI) 9-4.8 MCG/ACT AERO Inhale 2 puffs into the lungs 2 (two) times daily. 10.7 g 5  . guaiFENesin (MUCINEX) 600 MG 12 hr tablet Take 600 mg by mouth daily.     . hydroxypropyl methylcellulose (ISOPTO TEARS) 2.5 % ophthalmic solution Place 2 drops into both eyes 4 (four) times daily as needed (for dry eyes).    [name] levothyroxine (SYNTHROID, LEVOTHROID) 25 MCG tablet TAKE ONE TABLET BY MOUTH  ONCE DAILY, TAKE 1.5 TABLETS ON WEDNESDAY 32 tablet 3  . metoprolol succinate (TOPROL-XL) 25 MG 24 hr tablet 1 tab by mouth daily (Patient taking differently: 1/2 tab by mouth daily) 90 tablet 1  . PARoxetine (PAXIL) 20 MG tablet TAKE ONE TABLET BY MOUTH ONCE DAILY 30 tablet 0   No current facility-administered medications on file prior to visit.    Past Medical History  Diagnosis Date  . COPD (chronic obstructive pulmonary disease) ([place])   . Myopathy   . Left lumbar radiculopathy   . Hyperlipidemia   . Hypertension   . Osteoarthritis   . Hypothyroidism   . Anxiety   . Pneumonia 1/09    hx  . Shortness of breath   . Heart murmur   . History of recurrent UTIs   . GERD (gastroesophageal reflux disease)     occ  . Cancer ([place])     squamous cell rectal area    Past Surgical History  Procedure Laterality Date  . Uterine ablation      Dr Ubaldo Glassing  . Dilation and curettage of uterus    . Glaucoma surgery    . Colonoscopy with polypectomy    . Squamous cell cancer  resection      rectal; Dr Morton Stall , [facility]  . Cataract extraction, bilateral    . Microlaryngoscopy Right 04/26/2012    Procedure: MICROLARYNGOSCOPY WITH EXCISION OF VOCAL CORD LESION;  Surgeon: Izora Gala, MD;  Location: [facility];  Service: ENT;  Laterality: Right;    Social History   Social History  . Marital Status: Widowed    Spouse Name: N/A  . Number of Children: N/A  . Years of Education: N/A   Social History Main Topics  . Smoking status: Former Smoker -- 1.00 packs/day for 57 years    Types: Cigarettes    Quit date: 02/26/2007  . Smokeless tobacco: None     Comment: smoked age 73- 74 up to 1 ppd  occ alcohol  . Alcohol Use: Yes  . Drug Use: No  . Sexual Activity: Not Asked   Other Topics Concern  . None   Social History Narrative    Family History  Problem Relation Age of Onset  . Arthritis Mother   . Diabetes Mother   . Heart disease Mother     MI @ 4  . Kidney disease Father   . Heart  disease Father     Rheumatic Heart Disease  . Asthma Sister   . Cancer Brother     brother breast cancer, melanoma  . Stroke Maternal Grandfather     ? age  . Stroke Paternal Grandfather 34    Review of Systems  Constitutional: Positive for fatigue (? related to SOB). Negative for fever and unexpected weight change.  Respiratory: Positive for cough,  shortness of breath and wheezing.   Cardiovascular: Positive for chest pain (when at rest, does not last long). Negative for palpitations and leg swelling.  Gastrointestinal: Positive for abdominal pain.  Neurological: Positive for dizziness, light-headedness and headaches (mild).       Objective:   Filed Vitals:   07/11/15 1418  BP: 122/60  Pulse: 57  Temp: 97.8 F (36.6 C)   Filed Weights   07/11/15 1418  Weight: 150 lb 3 oz (68.125 kg)   Body mass index is 28.39 kg/(m^2).   Physical Exam Constitutional: Appears well-developed and well-nourished. No distress.  Neck: Neck supple. No tracheal deviation present. No thyromegaly present.  No carotid bruit. No cervical adenopathy.   Cardiovascular: Normal rate, regular rhythm and normal heart sounds.   No murmur heard.  No edema Pulmonary/Chest: Effort normal and breath sounds normal. No respiratory distress. No wheezes.       Assessment & Plan:   See Problem List for Assessment and Plan of chronic medical problems.  F/u in 2-3 months

## 2015-07-11 NOTE — Patient Instructions (Addendum)
  Test(s) ordered today. Your results will be released to [facility] (or called to you) after review, usually within 72hours after test completion. If any changes need to be made, you will be notified at that same time.  All other Health Maintenance issues reviewed.   All recommended immunizations and age-appropriate screenings are up-to-date or discussed.  No immunizations administered today.   Medications reviewed and updated.  No changes recommended at this time.  Your prescription(s) have been submitted to your pharmacy. Please take as directed and contact our office if you believe you are having problem(s) with the medication(s).  An ultrasound of your heart was ordered and we will call you to schedule this.    Please followup in 2 months

## 2015-07-11 NOTE — Progress Notes (Signed)
Pre visit review using our clinic review tool, if applicable. No additional management support is needed unless otherwise documented below in the visit note. 

## 2015-07-12 ENCOUNTER — Encounter: Payer: Self-pay | Admitting: Internal Medicine

## 2015-07-12 ENCOUNTER — Other Ambulatory Visit: Payer: Self-pay | Admitting: Internal Medicine

## 2015-07-12 MED ORDER — LEVOTHYROXINE SODIUM 50 MCG PO TABS: 50.0000 ug | ORAL_TABLET | Freq: Every day | ORAL | Status: DC

## 2015-07-12 NOTE — Assessment & Plan Note (Signed)
BP well controlled Current regimen effective and well tolerated Continue current medications at current doses cmp  

## 2015-07-12 NOTE — Assessment & Plan Note (Signed)
Chronic pain Will refer to orthopedics for further evaluation - possible injections Can try tylenol, avoid nsaids - has decreased kidney function, avoid narcotics given age

## 2015-07-12 NOTE — Assessment & Plan Note (Signed)
Last echo in 2014 showed normal EF - will recheck echo COPD is contributing Likely deconditioning is contributing as well

## 2015-07-12 NOTE — Assessment & Plan Note (Signed)
Check tsh, b12 level Discussed neuro referral for eval and possible treatment - she declined at this time, but will think about it

## 2015-07-12 NOTE — Assessment & Plan Note (Signed)
She is probably getting enough sleep, but is sleeping during the morning Is she wants to readjust her sleep (she is not sure she wants to) she can try melatonin and start going to be 15 minutes earlier and slowly move back her bedtime

## 2015-07-12 NOTE — Assessment & Plan Note (Signed)
Check tsh  Titrate med dose if needed  

## 2015-07-22 ENCOUNTER — Other Ambulatory Visit: Payer: Self-pay | Admitting: Internal Medicine

## 2015-07-30 ENCOUNTER — Ambulatory Visit: Payer: Medicare Other | Admitting: Internal Medicine

## 2015-08-06 ENCOUNTER — Other Ambulatory Visit: Payer: Self-pay | Admitting: Internal Medicine

## 2015-08-08 ENCOUNTER — Other Ambulatory Visit: Payer: Self-pay

## 2015-08-08 ENCOUNTER — Ambulatory Visit (HOSPITAL_COMMUNITY): Payer: Medicare Other | Attending: Internal Medicine

## 2015-08-08 DIAGNOSIS — I10 Essential (primary) hypertension: Secondary | ICD-10-CM | POA: Insufficient documentation

## 2015-08-08 DIAGNOSIS — I358 Other nonrheumatic aortic valve disorders: Secondary | ICD-10-CM | POA: Diagnosis not present

## 2015-08-08 DIAGNOSIS — J449 Chronic obstructive pulmonary disease, unspecified: Secondary | ICD-10-CM | POA: Diagnosis not present

## 2015-08-08 DIAGNOSIS — R0600 Dyspnea, unspecified: Secondary | ICD-10-CM | POA: Insufficient documentation

## 2015-08-08 DIAGNOSIS — E785 Hyperlipidemia, unspecified: Secondary | ICD-10-CM | POA: Diagnosis not present

## 2015-08-08 DIAGNOSIS — I371 Nonrheumatic pulmonary valve insufficiency: Secondary | ICD-10-CM | POA: Insufficient documentation

## 2015-08-20 ENCOUNTER — Telehealth: Payer: Self-pay | Admitting: Internal Medicine

## 2015-08-20 NOTE — Telephone Encounter (Signed)
Pt request echo result that was done 08/08/15. Please call her back

## 2015-08-21 NOTE — Telephone Encounter (Signed)
LVM for pt to call back as soon as possible.   RE: there are no results. Listed as "in process" in the procedures tab in chart review.

## 2015-08-25 NOTE — Telephone Encounter (Signed)
There are still no results for this.  Please advise

## 2015-08-26 NOTE — Telephone Encounter (Signed)
Can you please call the imaging dept and see why it is "still in processing".  Thanks.

## 2015-08-27 ENCOUNTER — Encounter: Payer: Self-pay | Admitting: Internal Medicine

## 2015-08-27 DIAGNOSIS — I5189 Other ill-defined heart diseases: Secondary | ICD-10-CM | POA: Insufficient documentation

## 2015-08-27 LAB — ECHOCARDIOGRAM COMPLETE
E/e' ratio: 11.76
EWDT: 194 ms
FS: 36 % (ref 28–44)
IVS/LV PW RATIO, ED: 1.13
LA ID, A-P, ES: 44 mm
LA diam index: 2.63 cm/m2
LA vol index: 11.1 mL/m2
LAVOL: 18.5 mL
LAVOLA4C: 15.6 mL
LDCA: 4.15 cm2
LEFT ATRIUM END SYS DIAM: 44 mm
LV E/e' medial: 11.76
LV E/e'average: 11.76
LV PW d: 8.1 mm — AB (ref 0.6–1.1)
LV TDI E'LATERAL: 5.98
LV TDI E'MEDIAL: 3.81
LV e' LATERAL: 5.98 cm/s
LVOT SV: 69 mL
LVOT VTI: 16.6 cm
LVOTD: 23 mm
LVOTPV: 75.4 cm/s
MV Dec: 194
MV pk A vel: 102 m/s
MV pk E vel: 70.3 m/s
RV TAPSE: 20.3 mm
Reg peak vel: 270 cm/s
TR max vel: 270 cm/s

## 2015-08-27 NOTE — Telephone Encounter (Signed)
Called [facility]. They are having one of their Echo Techs take a look at the chart to see why the test hasnt resulted.    Will call back tomorrow if results are not up  CB [phone] Ronny Bacon ([facility] [employer])

## 2015-08-28 ENCOUNTER — Other Ambulatory Visit: Payer: Self-pay | Admitting: Internal Medicine

## 2015-08-28 NOTE — Telephone Encounter (Signed)
Spoke with pts daughter to give results.

## 2015-09-10 ENCOUNTER — Other Ambulatory Visit (INDEPENDENT_AMBULATORY_CARE_PROVIDER_SITE_OTHER): Payer: Medicare Other

## 2015-09-10 ENCOUNTER — Ambulatory Visit (INDEPENDENT_AMBULATORY_CARE_PROVIDER_SITE_OTHER): Payer: Medicare Other | Admitting: Internal Medicine

## 2015-09-10 ENCOUNTER — Encounter: Payer: Self-pay | Admitting: Internal Medicine

## 2015-09-10 VITALS — BP 110/66 | HR 64 | Temp 97.5°F | Resp 16 | Wt 146.0 lb

## 2015-09-10 DIAGNOSIS — E785 Hyperlipidemia, unspecified: Secondary | ICD-10-CM

## 2015-09-10 DIAGNOSIS — I10 Essential (primary) hypertension: Secondary | ICD-10-CM

## 2015-09-10 DIAGNOSIS — E039 Hypothyroidism, unspecified: Secondary | ICD-10-CM

## 2015-09-10 DIAGNOSIS — F39 Unspecified mood [affective] disorder: Secondary | ICD-10-CM

## 2015-09-10 DIAGNOSIS — R4586 Emotional lability: Secondary | ICD-10-CM

## 2015-09-10 DIAGNOSIS — J441 Chronic obstructive pulmonary disease with (acute) exacerbation: Secondary | ICD-10-CM | POA: Diagnosis not present

## 2015-09-10 LAB — COMPREHENSIVE METABOLIC PANEL
ALT: 22 U/L (ref 0–35)
AST: 40 U/L — AB (ref 0–37)
Albumin: 3.6 g/dL (ref 3.5–5.2)
Alkaline Phosphatase: 113 U/L (ref 39–117)
BUN: 21 mg/dL (ref 6–23)
CHLORIDE: 101 meq/L (ref 96–112)
CO2: 31 meq/L (ref 19–32)
CREATININE: 1.08 mg/dL (ref 0.40–1.20)
Calcium: 9.1 mg/dL (ref 8.4–10.5)
GFR: 51.58 mL/min — ABNORMAL LOW (ref >60.00)
Glucose, Bld: 94 mg/dL (ref 70–99)
Potassium: 3.2 mEq/L — ABNORMAL LOW (ref 3.5–5.1)
SODIUM: 140 meq/L (ref 135–145)
Total Bilirubin: 0.7 mg/dL (ref 0.2–1.2)
Total Protein: 7.1 g/dL (ref 6.0–8.3)

## 2015-09-10 LAB — LIPID PANEL
CHOL/HDL RATIO: 4
Cholesterol: 141 mg/dL (ref 0–200)
HDL: 31.8 mg/dL — ABNORMAL LOW (ref >39.00)
LDL CALC: 87 mg/dL (ref 0–99)
NonHDL: 108.95
TRIGLYCERIDES: 111 mg/dL (ref 0.0–149.0)
VLDL: 22.2 mg/dL (ref 0.0–40.0)

## 2015-09-10 LAB — TSH: TSH: 0.04 u[IU]/mL — AB (ref 0.35–4.50)

## 2015-09-10 NOTE — Patient Instructions (Addendum)

## 2015-09-10 NOTE — Assessment & Plan Note (Signed)
Check tsh  Titrate med dose if needed  

## 2015-09-10 NOTE — Assessment & Plan Note (Signed)
Mild exacerbation Probably related to viral URI Taking inhalers, mucinex  Symptoms improving No evidence of a bacterial infection Oxygen sat is good Since symptoms are improving - no other treatment now - can increase mucinex If symptoms do not improve or worsen in next two days she will call

## 2015-09-10 NOTE — Assessment & Plan Note (Signed)
Check lipid panel  

## 2015-09-10 NOTE — Assessment & Plan Note (Signed)
Taking paxil daily, which helps Continue paxil 20 mg daily

## 2015-09-10 NOTE — Assessment & Plan Note (Signed)
BP well controlled Current regimen effective and well tolerated Continue current medications at current doses cmp  

## 2015-09-10 NOTE — Progress Notes (Signed)
Pre visit review using our clinic review tool, if applicable. No additional management support is needed unless otherwise documented below in the visit note. 

## 2015-09-10 NOTE — Progress Notes (Signed)
Subjective:    Patient ID: Olivia [name], female    DOB: 1931-10-01, 82 y.o.   MRN: [id number]  HPI She is here for follow up.    Hypertension: She is taking her medication daily. She is compliant with a low sodium diet.  She denies chest pain, palpitations, and regular headaches. She is not exercising regularly.  She does not monitor her blood pressure at home.    Chronic back pain:  She did see orthopedics and was placed on prednisone.  She will be having an MRI soon and will follow up with orthopedics.   SOB, cough, wheeze:  She has not been feeling well for a few days.  It started over the weekend. She has soreness or achiness all over.  She has increased phlegm or mucus in her throat.  She has had a slightly increased cough and her lungs have felt sore.  She does feel a little better today.  She used the rescue inhaler for a day or two and she has not needed it for a couple of days.  She has been more tired.    Mood swings, emotional:  She is taking the paxil daily and this helps.    Medications and allergies reviewed with patient and updated if appropriate.  Patient Active Problem List   Diagnosis Date Noted  . Diastolic dysfunction [id number]  . Dyspnea 07/11/2015  . Upper airway cough syndrome 05/29/2015  . Hypokalemia 11/23/2013  . Spinal stenosis of lumbar region 11/21/2013  . Elevated serum creatinine 09/15/2013  . Abnormal ECG 03/10/2012  . Chest pressure 03/10/2012  . Hypothyroidism 01/11/2012  . Other abnormal glucose 08/24/2011  . MOOD SWINGS 03/19/2009  . ANXIETY STATE, UNSPECIFIED 01/02/2009  . Essential hypertension 01/02/2009  . ELECTROCARDIOGRAM, ABNORMAL 01/02/2009  . TINNITUS, LEFT 04/09/2008  . NEUROPATHY 12/07/2007  . Disturbance in sleep behavior 12/07/2007  . MEMORY LOSS 12/07/2007  . CARCINOMA IN SITU OF SKIN SITE UNSPECIFIED 10/17/2007  . PORTAL HYPERTENSION 10/17/2007  . EXTERNAL HEMORRHOIDS 10/16/2007  . COLONIC POLYPS, HYPERPLASTIC, HX OF  10/16/2007  . MALIG NEOPLASM CONNECTIVE&OTH SOFT TISSUE PELVIS 09/19/2007  . COPD GOLD III with atypical upper airway features  03/07/2007  . HYPERLIPIDEMIA NEC/NOS 10/25/2006  . MYOPATHY NOS 10/25/2006  . LUMBAR RADICULOPATHY, LEFT 10/25/2006    Current Outpatient Prescriptions on File Prior to Visit  Medication Sig Dispense Refill  . albuterol (PROVENTIL HFA;VENTOLIN HFA) 108 (90 BASE) MCG/ACT inhaler Inhale 2 puffs into the lungs every 6 (six) hours as needed for wheezing or shortness of breath. 1 Inhaler 6  . albuterol (PROVENTIL) (2.5 MG/3ML) 0.083% nebulizer solution Take 3 mLs (2.5 mg total) by nebulization every 4 (four) hours as needed for wheezing or shortness of breath. 75 mL 12  . aspirin 81 MG tablet Take 81 mg by mouth daily. Reported on 05/28/2015    . calcium carbonate (TUMS - DOSED IN MG ELEMENTAL CALCIUM) 500 MG chewable tablet Chew 1 tablet by mouth at bedtime as needed for heartburn.    . chlorthalidone (HYGROTON) 25 MG tablet TAKE ONE-HALF TABLET BY MOUTH ONCE DAILY 45 tablet 2  . cimetidine (TAGAMET) 200 MG tablet Take 100 mg by mouth at bedtime as needed (for heartburn).    . docusate sodium (COLACE) 100 MG capsule Take 100 mg by mouth daily as needed.     [name] FIBER PO Take 1 tablet by mouth daily as needed.     . Glycopyrrolate-Formoterol (BEVESPI) 9-4.8 MCG/ACT AERO Inhale 2 puffs into  the lungs 2 (two) times daily. 10.7 g 5  . guaiFENesin (MUCINEX) 600 MG 12 hr tablet Take 600 mg by mouth daily.     . hydroxypropyl methylcellulose (ISOPTO TEARS) 2.5 % ophthalmic solution Place 2 drops into both eyes 4 (four) times daily as needed (for dry eyes).    [name] levothyroxine (SYNTHROID, LEVOTHROID) 50 MCG tablet Take 1 tablet (50 mcg total) by mouth daily. 90 tablet 3  . metoprolol succinate (TOPROL-XL) 25 MG 24 hr tablet 1/2 tab by mouth daily 45 tablet 3  . PARoxetine (PAXIL) 20 MG tablet TAKE ONE TABLET BY MOUTH ONCE DAILY 30 tablet 0   No current facility-administered  medications on file prior to visit.    Past Medical History  Diagnosis Date  . COPD (chronic obstructive pulmonary disease) ([place])   . Myopathy   . Left lumbar radiculopathy   . Hyperlipidemia   . Hypertension   . Osteoarthritis   . Hypothyroidism   . Anxiety   . Pneumonia 1/09    hx  . Shortness of breath   . Heart murmur   . History of recurrent UTIs   . GERD (gastroesophageal reflux disease)     occ  . Cancer ([place])     squamous cell rectal area    Past Surgical History  Procedure Laterality Date  . Uterine ablation      Dr Ubaldo Glassing  . Dilation and curettage of uterus    . Glaucoma surgery    . Colonoscopy with polypectomy    . Squamous cell cancer  resection      rectal; Dr Morton Stall , [facility]  . Cataract extraction, bilateral    . Microlaryngoscopy Right 04/26/2012    Procedure: MICROLARYNGOSCOPY WITH EXCISION OF VOCAL CORD LESION;  Surgeon: Izora Gala, MD;  Location: [facility];  Service: ENT;  Laterality: Right;    Social History   Social History  . Marital Status: Widowed    Spouse Name: N/A  . Number of Children: N/A  . Years of Education: N/A   Social History Main Topics  . Smoking status: Former Smoker -- 1.00 packs/day for 57 years    Types: Cigarettes    Quit date: 02/26/2007  . Smokeless tobacco: Not on file     Comment: smoked age 37- 42 up to 1 ppd  occ alcohol  . Alcohol Use: Yes  . Drug Use: No  . Sexual Activity: Not on file   Other Topics Concern  . Not on file   Social History Narrative    Family History  Problem Relation Age of Onset  . Arthritis Mother   . Diabetes Mother   . Heart disease Mother     MI @ 15  . Kidney disease Father   . Heart disease Father     Rheumatic Heart Disease  . Asthma Sister   . Cancer Brother     brother breast cancer, melanoma  . Stroke Maternal Grandfather     ? age  . Stroke Paternal Grandfather 48    Review of Systems  Constitutional: Negative for fever and chills.  Respiratory: Positive for cough,  shortness of breath (increased x few days) and wheezing (increased x few days).        Lung soreness in front and side of lungs  Cardiovascular: Positive for leg swelling (mild). Negative for chest pain and palpitations.  Gastrointestinal: Positive for diarrhea (x 1 day only).  Musculoskeletal: Positive for neck pain (posterior - radiates up to head).  Neurological:  Negative for light-headedness and headaches.       Objective:   Filed Vitals:   09/10/15 1540  BP: 110/66  Pulse: 64  Temp: 97.5 F (36.4 C)  Resp: 16   Filed Weights   09/10/15 1540  Weight: 146 lb (66.225 kg)   Body mass index is 27.6 kg/(m^2).   Physical Exam  Constitutional: She appears well-developed and well-nourished. No distress.  HENT:  Head: Normocephalic and atraumatic.  Right Ear: External ear normal.  Left Ear: External ear normal.  Mouth/Throat: Oropharynx is clear and moist.  B/l ear canals and TM  Eyes: Conjunctivae are normal.  Neck: Neck supple. No tracheal deviation present. No thyromegaly present.  Cardiovascular: Normal rate, regular rhythm and normal heart sounds.   No murmur heard. Pulmonary/Chest: Effort normal and breath sounds normal. No respiratory distress. She has no wheezes.  Mild dry bibasilar crackles  Musculoskeletal: She exhibits edema (trace b/l LE).  Lymphadenopathy:    She has no cervical adenopathy.  Skin: Skin is warm and dry. She is not diaphoretic.        Assessment & Plan:   See Problem List for Assessment and Plan of chronic medical problems.

## 2015-09-13 ENCOUNTER — Other Ambulatory Visit: Payer: Self-pay | Admitting: Internal Medicine

## 2015-09-13 DIAGNOSIS — R7989 Other specified abnormal findings of blood chemistry: Secondary | ICD-10-CM

## 2015-09-13 DIAGNOSIS — E039 Hypothyroidism, unspecified: Secondary | ICD-10-CM

## 2015-09-13 MED ORDER — LEVOTHYROXINE SODIUM 50 MCG PO TABS: ORAL_TABLET | ORAL | Status: DC

## 2015-09-16 ENCOUNTER — Encounter: Payer: Self-pay | Admitting: Emergency Medicine

## 2015-09-16 ENCOUNTER — Telehealth: Payer: Self-pay | Admitting: Internal Medicine

## 2015-09-16 NOTE — Telephone Encounter (Signed)
Called and spoke with pt and she stated that her inhalers and meds are not helping her anymore and the last time she spoke with MW about this he told her the next step is nebulizer.  appt has been scheduled for the pt to see MW tomorrow at 215.  Nothing further is needed.

## 2015-09-17 ENCOUNTER — Ambulatory Visit (INDEPENDENT_AMBULATORY_CARE_PROVIDER_SITE_OTHER): Payer: Medicare Other | Admitting: Internal Medicine

## 2015-09-17 ENCOUNTER — Ambulatory Visit (INDEPENDENT_AMBULATORY_CARE_PROVIDER_SITE_OTHER)
Admission: RE | Admit: 2015-09-17 | Discharge: 2015-09-17 | Disposition: A | Discharge status: Home / Self Care | Payer: Medicare Other | Source: Ambulatory Visit | Attending: Internal Medicine | Admitting: Internal Medicine

## 2015-09-17 ENCOUNTER — Encounter: Payer: Self-pay | Admitting: Internal Medicine

## 2015-09-17 ENCOUNTER — Telehealth: Payer: Self-pay | Admitting: Internal Medicine

## 2015-09-17 ENCOUNTER — Other Ambulatory Visit (INDEPENDENT_AMBULATORY_CARE_PROVIDER_SITE_OTHER): Payer: Medicare Other

## 2015-09-17 VITALS — BP 112/70 | HR 65 | Ht 61.0 in | Wt 144.8 lb

## 2015-09-17 DIAGNOSIS — J449 Chronic obstructive pulmonary disease, unspecified: Secondary | ICD-10-CM

## 2015-09-17 DIAGNOSIS — R0600 Dyspnea, unspecified: Secondary | ICD-10-CM

## 2015-09-17 LAB — CBC WITH DIFFERENTIAL/PLATELET
BASOS ABS: 0 10*3/uL (ref 0.0–0.1)
Basophils Relative: 0.3 % (ref 0.0–3.0)
Eosinophils Absolute: 0.1 10*3/uL (ref 0.0–0.7)
Eosinophils Relative: 1.4 % (ref 0.0–5.0)
HEMATOCRIT: 39.7 % (ref 36.0–46.0)
Hemoglobin: 13.5 g/dL (ref 12.0–15.0)
LYMPHS ABS: 1.9 10*3/uL (ref 0.7–4.0)
LYMPHS PCT: 24.7 % (ref 12.0–46.0)
MCHC: 34.1 g/dL (ref 30.0–36.0)
MCV: 91.4 fl (ref 78.0–100.0)
MONOS PCT: 8.7 % (ref 3.0–12.0)
Monocytes Absolute: 0.7 10*3/uL (ref 0.1–1.0)
NEUTROS ABS: 4.9 10*3/uL (ref 1.4–7.7)
NEUTROS PCT: 64.9 % (ref 43.0–77.0)
PLATELETS: 216 10*3/uL (ref 150.0–400.0)
RBC: 4.34 Mil/uL (ref 3.87–5.11)
RDW: 14.4 % (ref 11.5–15.5)
WBC: 7.6 10*3/uL (ref 4.0–10.5)

## 2015-09-17 LAB — BRAIN NATRIURETIC PEPTIDE: Pro B Natriuretic peptide (BNP): 119 pg/mL — ABNORMAL HIGH (ref 0.0–100.0)

## 2015-09-17 MED ORDER — ALBUTEROL SULFATE (2.5 MG/3ML) 0.083% IN NEBU: 2.5000 mg | INHALATION_SOLUTION | Freq: Four times a day (QID) | RESPIRATORY_TRACT | 12 refills | Status: DC | PRN

## 2015-09-17 MED ORDER — BUDESONIDE 0.25 MG/2ML IN SUSP: RESPIRATORY_TRACT | 12 refills | Status: DC

## 2015-09-17 MED ORDER — PANTOPRAZOLE SODIUM 40 MG PO TBEC: 40.0000 mg | DELAYED_RELEASE_TABLET | Freq: Every day | ORAL | 2 refills | Status: AC

## 2015-09-17 MED ORDER — FORMOTEROL FUMARATE 20 MCG/2ML IN NEBU: 20.0000 ug | INHALATION_SOLUTION | Freq: Two times a day (BID) | RESPIRATORY_TRACT | 11 refills | Status: DC

## 2015-09-17 NOTE — Telephone Encounter (Signed)
Per Gasper Lloyd phone message was opened in error

## 2015-09-17 NOTE — Progress Notes (Signed)
Subjective:   Patient ID: Olivia [name], female    DOB: [birth year]  .   MRN: [id number]   Brief patient profile: 37 yowf quit smoking 2009 previously followed in pulmonary clinic by [name] with  Dx copd with Spirometry  02/04/2012  C/w GOLD III criteria though f/v loop not typical.  History of Present Illness  07/03/2014 Dr [name] final note  Chief Complaint  Patient presents with  . Follow-up    Pt c/o hoarness started after taking incruise, SOB, dry cough x 1 month.   Pt hoarse with incruse dpi.  Pt remains dyspnea on exertion.    rec Prevnar 13 was given Stay on Incruse and Advair  Use albuterol as needed    02/05/2015  Extended transition of care f/u ov/Olivia [name] re: establish care for copd Chief Complaint  Patient presents with  . Follow-up    Former pt of [name]. Pt still c/o hoarseness- relates to taking incruse. She has had increased cough for the past few days- non prod.  She is having some increased SOB today, and had to use ventolin for the first time in a month,   chronic  Doe x really winded when returns from [place] to house  ok at rest/ main concern is chronic hoarseness and variable severe dry cough day >>noct rec Plan A = Stiolto 2 puffs each am Plan B - Only use your albuterol (ventolin) as a rescue medication  Plan C = crisis - Albuterol neb up to every 4 hours if you try your ventolin first and it doesn't work   02/27/2015  f/u ov/Olivia [name] re: GOLD III with atypical f/v and upper airway component  Chief Complaint  Patient presents with  . Follow-up    Pt states she did well on stiolto- was coughing less and not as hoarse. She ran out of med after 2 wks and now symptoms are coming back, but are less severe.    doe = MMRC2 = can't walk a nl pace on a flat grade s sob/ rncrease need for saba p changed back to incruse rec Plan A =  Automatic Stiolto 2 puffs each am   (if they won't cover it the other option is BEVESPI up to 2 puffs every 12 hours and the other  option is nebulized formoterol/pulmicort thru part B medicare)    Plan B = Backup  - Only use your albuterol (ventolin) as a rescue medication Plan C = crisis - Albuterol neb up to every 4 hours if you try your ventolin first and it doesn't work    05/28/2015  f/u ov/Olivia [name] re: GOLD III / maint rx bevespi though hfa not ideal  Chief Complaint  Patient presents with  . Follow-up    Cough has improved some, but still throat clearing often.  Her hoarseness has resolved. She has only used rescue inhaler x 1 since the last visit and has not needed neb.   still doe = MMRC2 = can't walk a nl pace on a flat grade s sob but does fine slow and flat eg HT or [place] or parking lot, but overall better tol   still urge to clear throat day >> noct or early am rec Work on inhaler technique:   Try prilosec(omeprazole) otc 20mg   Take 30-60 min before first meal of the day and tagamet (cimetidine) 400  Mg @  bedtime until cough is completely gone for at least a week without the need for cough suppression GERD  Continue Bivespi Take 2 puffs first thing in am and then another 2 puffs about 12 hours later.     09/17/2015 extended  f/u ov/Olivia [name] re:  GOLD III no better on bevespi Chief Complaint  Patient presents with  . Acute Visit    Pt c/o increased SOB for the past 2 wks. She is using rescue inhaler 2 x daily on average and feels that this is no longer helping.  She also c/o increased cough which she relates to taking Bevespi. Cough is non prod.   states hasn't been to [place] or [facility] in many years (note hx changed)  Most days goes to [facility] and back and piddles along the way x ? How many years since you've been able to walk there and back s stopping:  A Never  answered occ at hs sob and takes ventolin and it doesn't work  Lots of upper airway tickling ? Taking gerd rx correctly/ unable to confirm    No obvious day to day or daytime variability or assoc excess/ purulent sputum or mucus plugs     cp or chest tightness,  subjective wheeze or overt sinus or hb symptoms. No unusual exp hx or h/o childhood pna/ asthma or knowledge of premature birth.  Sleeping ok without nocturnal  or early am exacerbation  of respiratory  c/o's or need for noct saba. Also denies any obvious fluctuation of symptoms with weather or environmental changes or other aggravating or alleviating factors except as outlined above   Current Medications, Allergies, Complete Past Medical History, Past Surgical History, Family History, and Social History were reviewed in [employer] record.  ROS  The following are not active complaints unless bolded sore throat, dysphagia, dental problems, itching, sneezing,  nasal congestion or excess/ purulent secretions, ear ache,   fever, chills, sweats, unintended wt loss, classically pleuritic or exertional cp, hemoptysis,  orthopnea pnd or leg swelling, presyncope, palpitations, abdominal pain, anorexia, nausea, vomiting, diarrhea  or change in bowel or bladder habits, change in stools or urine, dysuria,hematuria,  rash, arthralgias, visual complaints, headache, numbness, weakness or ataxia or problems with walking or coordination,  change in mood/affect or memory.           Objective:   Physical Exam   amb wf     09/17/2015       145  05/28/2015          153   02/27/2015         156   02/05/15 158 lb 6.4 oz (71.85 kg)  08/28/14 152 lb (68.947 kg)  07/03/14 153 lb 6.4 oz (69.582 kg)    Vital signs reviewed   HEENT: nl dentition, turbinates, and oropharynx. Nl external ear canals without cough reflex   NECK :  without JVD/Nodes/TM/ nl carotid upstrokes bilaterally   LUNGS: no acc muscle use,  Minimal barrel  contour chest which is clear to A and P bilaterally without cough on insp or exp maneuvers though bs distant s wheeze    CV:  RRR  no s3 or murmur or increase in P2, no edema   ABD:  soft and nontender with nl inspiratory excursion in the supine position. No bruits or  organomegaly, bowel sounds nl  MS:  Nl gait/ ext warm without deformities, calf tenderness, cyanosis or clubbing No obvious joint restrictions   SKIN: warm and dry without lesions    NEURO:  alert, approp though very stubborn affect/ continually interupting eval to state what she's  read and heard in past re her copd/  nl sensorium with  no motor deficits       CXR PA and Lateral:   09/17/2015 :    I personally reviewed images and agree with radiology impression as follows:    COPD. Mild interstitial prominence bilaterally slightly more conspicuous than in the past Mensch reflect acute bronchitic change. Stable right basilar scarring on the right. No alveolar pneumonia nor CHF.   Labs ordered/ reviewed:      Chemistry      Component Value Date/Time   NA 140 09/10/2015 1641   K 3.2 (L) 09/10/2015 1641   CL 101 09/10/2015 1641   CO2 31 09/10/2015 1641   BUN 21 09/10/2015 1641   CREATININE 1.08 09/10/2015 1641      Component Value Date/Time   CALCIUM 9.1 09/10/2015 1641   ALKPHOS 113 09/10/2015 1641   AST 40 (H) 09/10/2015 1641   ALT 22 09/10/2015 1641   BILITOT 0.7 09/10/2015 1641        Lab Results  Component Value Date   WBC 7.6 09/17/2015   HGB 13.5 09/17/2015   HCT 39.7 09/17/2015   MCV 91.4 09/17/2015   PLT 216.0 09/17/2015         Lab Results  Component Value Date   TSH 0.04 (L) 09/10/2015     Lab Results  Component Value Date   PROBNP 119.0 (H) 09/17/2015                Assessment & Plan:

## 2015-09-17 NOTE — Patient Instructions (Addendum)
Plan A =   Automatically Performist and budesonide twice daily     Plan B = Backup  - Only use your albuterol (ventolin) as a rescue medication to be used if you can't catch your breath by resting or doing a relaxed purse lip breathing pattern.  - The less you use it, the better it will work when you need it. - Ok to use up to 2 puffs  every 4 hours if you must but call for immediate appointment if use goes up over your usual need - Don't leave home without it !!  (think of it like the spare tire for your car)   Plan C = crisis - Albuterol neb up to every 4 hours if you try your ventolin first and it doesn't work  Please remember to go to the lab and x-ray department downstairs for your tests - we will call you with the results when they are available.  Pantoprazole (protonix) 40 mg   Take  30-60 min before first meal of the day and cimetidine 400  one @  bedtime until return to office - this is the best way to tell whether stomach acid is contributing to your problem.  GERD (REFLUX)  is an extremely common cause of respiratory symptoms just like yours , many times with no obvious heartburn at all.    It can be treated with medication, but also with lifestyle changes including elevation of the head of your bed (ideally with 6 inch  bed blocks),  Smoking cessation, avoidance of late meals, excessive alcohol, and avoid fatty foods, chocolate, peppermint, colas, red wine, and acidic juices such as orange juice.  NO MINT OR MENTHOL PRODUCTS SO NO COUGH DROPS   USE SUGARLESS CANDY INSTEAD (Jolley ranchers or Stover's or Life Savers) or even ice chips will also do - the key is to swallow to prevent all throat clearing. NO OIL BASED VITAMINS - use powdered substitutes.    Please schedule a follow up office visit in 4 weeks, sooner if needed with all active medications in hand

## 2015-09-18 ENCOUNTER — Encounter: Payer: Self-pay | Admitting: Internal Medicine

## 2015-09-18 NOTE — Assessment & Plan Note (Addendum)
Spirometry 02/04/2012:   FEV1 1.01(48%) ratio 48 with atypical features in the effort dep portion  - 02/05/2015  extensive coaching HFA effectiveness =    75% with respimat > try stiolto > improved but insurance wouldn't pay  - 03/11/15 changed to [place] due to insurance - 05/28/2015  extensive coaching HFA effectiveness =    75% - 09/17/2015   rec trial of perf/bud   She is extremely sedentary and I'm not really convinced talking to her today that she is copd-limited but needs a trial of neb laba to sort out since she does indeed have some component of copd  I had an extended discussion with the patient reviewing all relevant studies completed to date and  Lasting 25 min of 40 min extended ov re refractory symptoms of sob    Each maintenance medication was reviewed in detail including most importantly the difference between maintenance and prns and under what circumstances the prns are to be triggered using an action plan format that is not reflected in the computer generated alphabetically organized AVS.    Please see instructions for details which were reviewed in writing and the patient given a copy highlighting the part that I personally wrote and discussed at today's ov.

## 2015-09-18 NOTE — Progress Notes (Signed)
Spoke with pt and notified of results per Dr. Wert. Pt verbalized understanding and denied any questions. 

## 2015-09-22 ENCOUNTER — Telehealth: Payer: Self-pay | Admitting: Internal Medicine

## 2015-09-22 NOTE — Telephone Encounter (Signed)
Spoke with Olivia [name]. Advised her that the medications should not be mixed together but can be used in the same neb tubing. She verbalized understanding. Nothing further was needed.

## 2015-09-23 NOTE — Assessment & Plan Note (Signed)
Echo 06/2015: normal EF, diastolic dysfunction  Note bnp ok/ TSH too low but being addressed  by PC by skipping one dose a week since the value of  0.04 obtained  09/10/15 > Follow up per Primary Care planned

## 2015-10-02 ENCOUNTER — Other Ambulatory Visit: Payer: Self-pay | Admitting: Internal Medicine

## 2015-10-06 ENCOUNTER — Telehealth: Payer: Self-pay | Admitting: Internal Medicine

## 2015-10-06 NOTE — Telephone Encounter (Signed)
error 

## 2015-10-06 NOTE — Telephone Encounter (Signed)
Per last OV: Plan A =   Automatically Performist and budesonide twice daily     Plan B = Backup  - Only use your albuterol (ventolin) as a rescue medication to be used if you can't catch your breath by resting or doing a relaxed purse lip breathing pattern.  - The less you use it, the better it will work when you need it. - Ok to use up to 2 puffs  every 4 hours if you must but call for immediate appointment if use goes up over your usual need - Don't leave home without it !!  (think of it like the spare tire for your car)    Plan C = crisis - Albuterol neb up to every 4 hours if you try your ventolin first and it doesn't work   Please remember to go to the lab and x-ray department downstairs for your tests - we will call you with the results when they are available.   Pantoprazole (protonix) 40 mg   Take  30-60 min before first meal of the day and cimetidine 400  one @  bedtime until return to office - this is the best way to tell whether stomach acid is contributing to your problem.   GERD (REFLUX)  is an extremely common cause of respiratory symptoms just like yours , many times with no obvious heartburn at all.     It can be treated with medication, but also with lifestyle changes including elevation of the head of your bed (ideally with 6 inch  bed blocks),  Smoking cessation, avoidance of late meals, excessive alcohol, and avoid fatty foods, chocolate, peppermint, colas, red wine, and acidic juices such as orange juice.  NO MINT OR MENTHOL PRODUCTS SO NO COUGH DROPS   USE SUGARLESS CANDY INSTEAD (Jolley ranchers or Stover's or Life Savers) or even ice chips will also do - the key is to swallow to prevent all throat clearing. NO OIL BASED VITAMINS - use powdered substitutes. Please schedule a follow up office visit in 4 weeks, sooner if needed with all active medications in hand    ---  Called spoke with daughter. She reports the perforomist and budesonide nebs are not helping patient  at all. Feels like patient is worse. Also pt has been using the albuterol in her nebulizer doesn't feel this helps either. Please advise MW thanks

## 2015-10-06 NOTE — Telephone Encounter (Signed)
Called spoke with pt's daughter. Reviewed MW's recs. Scheduled her on 10/10/15 with MW. She voiced understanding and had no further questions. Nothing further needed.

## 2015-10-06 NOTE — Telephone Encounter (Signed)
If sob at rest and not responding to saba neb then needs to go to ER  - if willing to return here for scheduled ov this week ok to work into my schedule but must bring all active meds /nebs with her

## 2015-10-08 ENCOUNTER — Ambulatory Visit (INDEPENDENT_AMBULATORY_CARE_PROVIDER_SITE_OTHER): Payer: Medicare Other | Admitting: Internal Medicine

## 2015-10-08 ENCOUNTER — Other Ambulatory Visit (INDEPENDENT_AMBULATORY_CARE_PROVIDER_SITE_OTHER): Payer: Medicare Other

## 2015-10-08 ENCOUNTER — Encounter: Payer: Self-pay | Admitting: Internal Medicine

## 2015-10-08 ENCOUNTER — Other Ambulatory Visit: Payer: Self-pay

## 2015-10-08 VITALS — BP 114/66 | HR 89 | Temp 97.9°F | Ht 61.0 in | Wt 141.6 lb

## 2015-10-08 DIAGNOSIS — E039 Hypothyroidism, unspecified: Secondary | ICD-10-CM | POA: Diagnosis not present

## 2015-10-08 DIAGNOSIS — J449 Chronic obstructive pulmonary disease, unspecified: Secondary | ICD-10-CM

## 2015-10-08 LAB — TSH: TSH: 0.06 u[IU]/mL — ABNORMAL LOW (ref 0.35–4.50)

## 2015-10-08 MED ORDER — TIOTROPIUM BROMIDE MONOHYDRATE 2.5 MCG/ACT IN AERS: 2.0000 | INHALATION_SPRAY | Freq: Every day | RESPIRATORY_TRACT | 0 refills | Status: DC

## 2015-10-08 NOTE — Progress Notes (Signed)
Subjective:  Patient ID: Olivia [name], female    DOB: [birth year]     MRN: [id number]   Brief patient profile: 35 yowf quit smoking 2009 previously followed in pulmonary clinic by [name] with  Dx copd with Spirometry  02/04/2012  C/w GOLD III criteria though f/v loop not typical.    History of Present Illness  07/03/2014 Dr [name] final note  Chief Complaint  Patient presents with  . Follow-up    Pt c/o hoarness started after taking incruise, SOB, dry cough x 1 month.   Pt hoarse with incruse dpi.  Pt remains dyspnea on exertion.    rec Prevnar 13 was given Stay on Incruse and Advair  Use albuterol as needed    02/05/2015  Extended transition of care f/u ov/Wert re: establish care for copd Chief Complaint  Patient presents with  . Follow-up    Former pt of [name]. Pt still c/o hoarseness- relates to taking incruse. She has had increased cough for the past few days- non prod.  She is having some increased SOB today, and had to use ventolin for the first time in a month,   chronic  Doe x really winded when returns from [place] to house  ok at rest/ main concern is chronic hoarseness and variable severe dry cough day >>noct rec Plan A = Stiolto 2 puffs each am Plan B - Only use your albuterol (ventolin) as a rescue medication  Plan C = crisis - Albuterol neb up to every 4 hours if you try your ventolin first and it doesn't work   02/27/2015  f/u ov/Wert re: GOLD III with atypical f/v and upper airway component  Chief Complaint  Patient presents with  . Follow-up    Pt states she did well on stiolto- was coughing less and not as hoarse. She ran out of med after 2 wks and now symptoms are coming back, but are less severe.    doe = MMRC2 = can't walk a nl pace on a flat grade s sob/ rncrease need for saba p changed back to incruse rec Plan A =  Automatic Stiolto 2 puffs each am   (if they won't cover it the other option is BEVESPI up to 2 puffs every 12 hours and the other  option is nebulized formoterol/pulmicort thru part B medicare)    Plan B = Backup  - Only use your albuterol (ventolin) as a rescue medication Plan C = crisis - Albuterol neb up to every 4 hours if you try your ventolin first and it doesn't work    05/28/2015  f/u ov/Wert re: GOLD III / maint rx bevespi though hfa not ideal  Chief Complaint  Patient presents with  . Follow-up    Cough has improved some, but still throat clearing often.  Her hoarseness has resolved. She has only used rescue inhaler x 1 since the last visit and has not needed neb.   still doe = MMRC2 = can't walk a nl pace on a flat grade s sob but does fine slow and flat eg [place] or [facility] or parking lot, but overall better tol   still urge to clear throat day >> noct or early am rec Work on inhaler technique:   Try prilosec(omeprazole) otc 20mg   Take 30-60 min before first meal of the day and tagamet (cimetidine) 400  Mg @  bedtime until cough is completely gone for at least a week without the need for cough suppression GERD  Continue Bivespi Take 2 puffs first thing in am and then another 2 puffs about 12 hours later.     09/17/2015 extended  f/u ov/Wert re:  GOLD III no better on bevespi Chief Complaint  Patient presents with  . Acute Visit    Pt c/o increased SOB for the past 2 wks. She is using rescue inhaler 2 x daily on average and feels that this is no longer helping.  She also c/o increased cough which she relates to taking Bevespi. Cough is non prod.   states hasn't been to [facility] or [facility] in many years (note hx changed)  Most days goes to [facility] and back and piddles along the way x ? How many years since you've been able to walk there and back s stopping:  A Never  answered occ at hs sob and takes ventolin and it doesn't work  Lots of upper airway tickling ? Taking gerd rx correctly/ unable to confirm  rec Plan A =   Automatically Performist and budesonide twice daily  Plan B =  Backup  - Only use your albuterol (ventolin)    Plan C = crisis - Albuterol neb up to every 4 hours if you try your ventolin first and it doesn't work  le.   10/08/2015  Extended acute ov/Wert re: GOLD III  On perf/bud and multiple saba / very disorganized with meds Chief Complaint  Patient presents with  . Acute Visit    pt c/o worsening SOB, intermittent chest pains present X1 month.    no longer needing ventolin in middle of night  Neb twice daily  Can't use spiriva powder due to airway issue  Waking up at night hungry and eating meals "all day and night"  Doe = MMRC4  = sob if tries to leave home or while getting dressed  - sob room to room   No obvious day to day or daytime variability or assoc excess/ purulent sputum or mucus plugs or hemoptysis or cp or chest tightness, subjective wheeze or overt sinus or hb symptoms. No unusual exp hx or h/o childhood pna/ asthma or knowledge of premature birth.  Sleeping ok without nocturnal  or early am exacerbation  of respiratory  c/o's or need for noct saba. Also denies any obvious fluctuation of symptoms with weather or environmental changes or other aggravating or alleviating factors except as outlined above   Current Medications, Allergies, Complete Past Medical History, Past Surgical History, Family History, and Social History were reviewed in [employer] record.  ROS  The following are not active complaints unless bolded sore throat, dysphagia, dental problems, itching, sneezing,  nasal congestion or excess/ purulent secretions, ear ache,   fever, chills, sweats, unintended wt loss, classically pleuritic or exertional cp,  orthopnea pnd or leg swelling, presyncope, palpitations, abdominal pain, anorexia, nausea, vomiting, diarrhea  or change in bowel or bladder habits, change in stools or urine, dysuria,hematuria,  rash, arthralgias, visual complaints, headache, numbness, weakness or ataxia or problems with walking or coordination,  change in mood/affect or  memory.                Objective:   Physical Exam   amb anxious/ horase  wf   With pseudowheeze resolves with plm   10/08/2015       141  09/17/2015       145  05/28/2015          153   02/27/2015  156   02/05/15 158 lb 6.4 oz (71.85 kg)  08/28/14 152 lb (68.947 kg)  07/03/14 153 lb 6.4 oz (69.582 kg)    Vital signs reviewed   HEENT: nl dentition, turbinates, and oropharynx. Nl external ear canals without cough reflex   NECK :  without JVD/Nodes/TM/ nl carotid upstrokes bilaterally   LUNGS: no acc muscle use,  Minimal barrel  contour kyphotic chest which is clear to A and P bilaterally without cough on insp or exp maneuvers though bs distant s wheeze    CV:  RRR  no s3 or murmur or increase in P2, no edema   ABD:  soft and nontender with nl inspiratory excursion in the supine position. No bruits or organomegaly, bowel sounds nl  MS:  Nl gait/ ext warm without deformities, calf tenderness, cyanosis or clubbing No obvious joint restrictions   SKIN: warm and dry without lesions    NEURO:  alert, approp though very stubborn affect/ continually interupting eval to state what she's read and heard in past re her copd/  nl sensorium with  no motor deficits       CXR PA and Lateral:   09/17/2015 :    I personally reviewed images and agree with radiology impression as follows:    COPD. Mild interstitial prominence bilaterally slightly more conspicuous than in the past K[place]zel reflect acute bronchitic change. Stable right basilar scarring on the right. No alveolar pneumonia nor CHF.   Labs ordered/ reviewed:   Lab Results  Component Value Date   TSH 0.06 (L) 10/08/2015                  Assessment & Plan:

## 2015-10-08 NOTE — Patient Instructions (Addendum)
Plan A = Automatic =  Budesonide/permforist neb immediately on awakening and spiriva 2 pffs each am right after the AM neb  Try prilosec otc 20mg   Take 30-60 min before first meal of the day and Pepcid ac (famotidine) 20 mg one @  bedtime (or cimetidine until return to office  Hold the thyroid pill  For a week then resume the previous instructions      Plan B = Backup Only use your albuterol (ventolin) as a rescue medication to be used if you can't catch your breath by resting or doing a relaxed purse lip breathing pattern.  - The less you use it, the better it will work when you need it. - Ok to use the inhaler up to 2 puffs  every 4 hours if you must but call for appointment if use goes up over your usual need - Don't leave home without it !!  (think of it like the spare tire for your car)   Plan C = Crisis - only use your albuterol nebulizer if you first try Plan B and it fails to help > ok to use the nebulizer up to every 4 hours but if start needing it regularly call for immediate appointment   Plan D = Doctor - call me if B and C not adequate  Plan E = ER - go to ER or call 911 if all else fails    Please remember to go to the  lab department downstairs for your tests - we will call you with the results when they are available.     Please schedule a follow up visit in 3 months but call sooner if needed

## 2015-10-09 ENCOUNTER — Encounter: Payer: Self-pay | Admitting: Internal Medicine

## 2015-10-09 NOTE — Assessment & Plan Note (Addendum)
Spirometry 02/04/2012:   FEV1 1.01(48%) ratio 48 with atypical features in the effort dep portion  - 02/05/2015  extensive coaching HFA effectiveness =    75% with respimat > try stiolto > improved but insurance wouldn't pay  - 03/11/15 changed to [place] due to insurance - 05/28/2015  extensive coaching HFA effectiveness =    75% - 09/17/2015   rec trial of perf/bud  - 10/08/2015   Walked RA  2 laps @ 185 ft each stopped due to sob/ unsteady of feet/ fatigue but no desats  - 10/08/2015 added spiriva respimat   Symptoms continue to be  disproportionate to objective findings and not clear this is all a lung problem but pt does appear to have difficult airway management issues.  DDX of  difficult airways management almost all start with A and  include Adherence, Ace Inhibitors, Acid Reflux, Active Sinus Disease, Alpha 1 Antitripsin deficiency, Anxiety masquerading as Airways dz,  ABPA,  Allergy(esp in young), Aspiration (esp in elderly), Adverse effects of meds,  Active smokers, A bunch of PE's (a small clot burden can't cause this syndrome unless there is already severe underlying pulm or vascular dz with poor reserve) plus two Bs  = Bronchiectasis and Beta blocker use..and one C= CHF   Adherence is always the initial "prime suspect" and is a multilayered concern that requires a "trust but verify" approach in every patient - starting with knowing how to use medications, especially inhalers, correctly, keeping up with refills and understanding the fundamental difference between maintenance and prns vs those medications only taken for a very short course and then stopped and not refilled.  - - The proper method of use, as well as anticipated side effects, of a metered-dose inhaler are discussed and demonstrated to the patient. Improved effectiveness after extensive coaching during this visit to a level of approximately 75 % from a baseline of 50 %  But near 90% with respimat so rec rechallenge  with spiriva     ? Acid (or non-acid) GERD > always difficult to exclude as up to 75% of pts in some series report no assoc GI/ Heartburn symptoms> rec continue max (24h)  acid suppression and diet restrictions/ reviewed     ? chf > excluded with BNP only 119  last ov   Hyperthryoidism/polyphagia/ wt loss noted with very low tsh  Soman be contributing to symptoms also  (see separate a/p)   For now best option is keep it simple, work on compliance and f/u   I had an extended discussion with the patient/daughter reviewing all relevant studies completed to date and  lasting 25 minutes of a 40  minute acute extended  visit  To address chronic  refractory symptoms that are worsening on approp rx for copd   Each maintenance medication was reviewed in detail including most importantly the difference between maintenance and prns and under what circumstances the prns are to be triggered using an action plan format that is not reflected in the computer generated alphabetically organized AVS.    Please see instructions for details which were reviewed in writing and the patient given a copy highlighting the part that I personally wrote and discussed at today's ov.

## 2015-10-09 NOTE — Assessment & Plan Note (Signed)
tsh still quite low and pt losing wt despite marked increase in calorie intake c/w hyperthryroid from supplements so rec hold x 1 week then recheck and only restart when tsh on high side.

## 2015-10-10 ENCOUNTER — Ambulatory Visit: Payer: Medicare Other | Admitting: Internal Medicine

## 2015-10-15 ENCOUNTER — Ambulatory Visit: Payer: Medicare Other | Admitting: Internal Medicine

## 2015-10-17 ENCOUNTER — Other Ambulatory Visit (INDEPENDENT_AMBULATORY_CARE_PROVIDER_SITE_OTHER): Payer: Medicare Other

## 2015-10-17 ENCOUNTER — Other Ambulatory Visit: Payer: Self-pay

## 2015-10-17 DIAGNOSIS — E039 Hypothyroidism, unspecified: Secondary | ICD-10-CM

## 2015-10-17 LAB — TSH: TSH: 0.14 u[IU]/mL — AB (ref 0.35–4.50)

## 2015-10-20 ENCOUNTER — Telehealth: Payer: Self-pay | Admitting: Internal Medicine

## 2015-10-20 DIAGNOSIS — R0600 Dyspnea, unspecified: Secondary | ICD-10-CM

## 2015-10-20 NOTE — Progress Notes (Signed)
LMTCB

## 2015-10-20 NOTE — Progress Notes (Signed)
Called spoke with pt. Reviewed results and recs. Pt refused to make ov at this time. Requested lab order be placed for 2 weeks. She voiced understanding and had no further questions. Order has been placed. Nothing further needed.

## 2015-10-20 NOTE — Telephone Encounter (Signed)
Notes Recorded by Tanda Rockers, MD on 10/18/2015 at 6:00 AM EDT Call patient : Study shows thryroid hormones still too high, continue to leave off replacement for another 2 weeks then recheck TSH   Called spoke with Olivia [name]. Informed her of results and recs. Pt refused to make follow up for ov. She requested order be placed for TSH. She voiced understanding and had no further questions. Order has been placed. Nothing further needed.

## 2015-11-03 ENCOUNTER — Other Ambulatory Visit: Payer: Self-pay | Admitting: Internal Medicine

## 2015-11-06 ENCOUNTER — Telehealth: Payer: Self-pay | Admitting: Internal Medicine

## 2015-11-06 MED ORDER — TIOTROPIUM BROMIDE MONOHYDRATE 2.5 MCG/ACT IN AERS: 2.0000 | INHALATION_SPRAY | Freq: Every day | RESPIRATORY_TRACT | 3 refills | Status: DC

## 2015-11-06 NOTE — Telephone Encounter (Signed)
Called spoke with daughter. Aware Rx has been sent in for the spiriva respimat.  Nothing further needed.

## 2015-11-07 ENCOUNTER — Telehealth: Payer: Self-pay | Admitting: Internal Medicine

## 2015-11-07 NOTE — Telephone Encounter (Signed)
Received letter from [place] stating spiriva respimat not covered  Insurance prefers [employer]  Per MW- needs ov  I called and spoke with the pt's daughter and notified of recs and appt sched with TP for 11/10/15

## 2015-11-10 ENCOUNTER — Other Ambulatory Visit (INDEPENDENT_AMBULATORY_CARE_PROVIDER_SITE_OTHER): Payer: Medicare Other

## 2015-11-10 ENCOUNTER — Ambulatory Visit (INDEPENDENT_AMBULATORY_CARE_PROVIDER_SITE_OTHER): Payer: Medicare Other | Admitting: Adult Health

## 2015-11-10 ENCOUNTER — Encounter: Payer: Self-pay | Admitting: Adult Health

## 2015-11-10 DIAGNOSIS — J449 Chronic obstructive pulmonary disease, unspecified: Secondary | ICD-10-CM | POA: Diagnosis not present

## 2015-11-10 DIAGNOSIS — E039 Hypothyroidism, unspecified: Secondary | ICD-10-CM

## 2015-11-10 LAB — TSH: TSH: 0.63 u[IU]/mL (ref 0.35–4.50)

## 2015-11-10 MED ORDER — UMECLIDINIUM-VILANTEROL 62.5-25 MCG/INH IN AEPB: 1.0000 | INHALATION_SPRAY | Freq: Every day | RESPIRATORY_TRACT | 5 refills | Status: DC

## 2015-11-10 NOTE — Patient Instructions (Signed)
Stop Budesonide and Perforomist nebs  Stop Spiriva  Begin ANORO 1 puff daily , rinse after use.  Use Albuterol inhaler or neb every 4h as needed. -this is your emergency medicine.  Labs today  Follow up with Primary for thyroid .  follow up Dr. Melvyn Novas  In 6-8 weeks and As needed   Please contact office for sooner follow up if symptoms do not improve or worsen or seek emergency care

## 2015-11-10 NOTE — Addendum Note (Signed)
Addended by: Osa Craver on: 11/10/2015 04:32 PM   Modules accepted: Orders

## 2015-11-10 NOTE — Progress Notes (Signed)
Chart and office note reviewed in detail  > agree with a/p as outlined    

## 2015-11-10 NOTE — Assessment & Plan Note (Signed)
Trial of ANORO   Plan  Patient Instructions  Stop Budesonide and Perforomist nebs  Stop Spiriva  Begin ANORO 1 puff daily , rinse after use.  Use Albuterol inhaler or neb every 4h as needed. -this is your emergency medicine.  Labs today  Follow up with Primary for thyroid .  follow up Dr. Melvyn Novas  In 6-8 weeks and As needed   Please contact office for sooner follow up if symptoms do not improve or worsen or seek emergency care

## 2015-11-10 NOTE — Assessment & Plan Note (Signed)
Check TSH and follow up with PCP

## 2015-11-10 NOTE — Progress Notes (Signed)
Subjective:  Patient ID: Olivia [name], female    DOB: 11-28-1931     MRN: [id number]   Brief patient profile: 47 yowf quit smoking 2009 previously followed in pulmonary clinic by [name] with  Dx copd with Spirometry  02/04/2012  C/w GOLD III criteria though f/v loop not typical.    History of Present Illness  07/03/2014 Dr [name] final note  Chief Complaint  Patient presents with  . Follow-up    Pt c/o hoarness started after taking incruise, SOB, dry cough x 1 month.   Pt hoarse with incruse dpi.  Pt remains dyspnea on exertion.    rec Prevnar 13 was given Stay on Incruse and Advair  Use albuterol as needed    02/05/2015  Extended transition of care f/u ov/Wert re: establish care for copd Chief Complaint  Patient presents with  . Follow-up    Former pt of [name]. Pt still c/o hoarseness- relates to taking incruse. She has had increased cough for the past few days- non prod.  She is having some increased SOB today, and had to use ventolin for the first time in a month,   chronic  Doe x really winded when returns from [place] to house  ok at rest/ main concern is chronic hoarseness and variable severe dry cough day >>noct rec Plan A = Stiolto 2 puffs each am Plan B - Only use your albuterol (ventolin) as a rescue medication  Plan C = crisis - Albuterol neb up to every 4 hours if you try your ventolin first and it doesn't work   02/27/2015  f/u ov/Wert re: GOLD III with atypical f/v and upper airway component  Chief Complaint  Patient presents with  . Follow-up    Pt states she did well on stiolto- was coughing less and not as hoarse. She ran out of med after 2 wks and now symptoms are coming back, but are less severe.    doe = MMRC2 = can't walk a nl pace on a flat grade s sob/ rncrease need for saba p changed back to incruse rec Plan A =  Automatic Stiolto 2 puffs each am   (if they won't cover it the other option is BEVESPI up to 2 puffs every 12 hours and the other  option is nebulized formoterol/pulmicort thru part B medicare)    Plan B = Backup  - Only use your albuterol (ventolin) as a rescue medication Plan C = crisis - Albuterol neb up to every 4 hours if you try your ventolin first and it doesn't work    05/28/2015  f/u ov/Wert re: GOLD III / maint rx bevespi though hfa not ideal  Chief Complaint  Patient presents with  . Follow-up    Cough has improved some, but still throat clearing often.  Her hoarseness has resolved. She has only used rescue inhaler x 1 since the last visit and has not needed neb.   still doe = MMRC2 = can't walk a nl pace on a flat grade s sob but does fine slow and flat eg HT or WM or parking lot, but overall better tol   still urge to clear throat day >> noct or early am rec Work on inhaler technique:   Try prilosec(omeprazole) otc 20mg   Take 30-60 min before first meal of the day and tagamet (cimetidine) 400  Mg @  bedtime until cough is completely gone for at least a week without the need for cough suppression GERD  Continue Bivespi Take 2 puffs first thing in am and then another 2 puffs about 12 hours later.     09/17/2015 extended  f/u ov/Wert re:  GOLD III no better on bevespi Chief Complaint  Patient presents with  . Acute Visit    Pt c/o increased SOB for the past 2 wks. She is using rescue inhaler 2 x daily on average and feels that this is no longer helping.  She also c/o increased cough which she relates to taking Bevespi. Cough is non prod.   states hasn't been to HT or WM in many years (note hx changed)  Most days goes to [place] and back and piddles along the way x ? How many years since you've been able to walk there and back s stopping:  A Never  answered occ at hs sob and takes ventolin and it doesn't work  Lots of upper airway tickling ? Taking gerd rx correctly/ unable to confirm  rec Plan A =   Automatically Performist and budesonide twice daily  Plan B =  Backup  - Only use your albuterol (ventolin)    Plan C = crisis - Albuterol neb up to every 4 hours if you try your ventolin first and it doesn't work  le.   10/08/2015  Extended acute ov/Wert re: GOLD III  On perf/bud and multiple saba / very disorganized with meds Chief Complaint  Patient presents with  . Acute Visit    pt c/o worsening SOB, intermittent chest pains present X1 month.    no longer needing ventolin in middle of night  Neb twice daily  Can't use spiriva powder due to airway issue  Waking up at night hungry and eating meals "all day and night"  Doe = MMRC4  = sob if tries to leave home or while getting dressed  - sob room to room  >>buedsione and perforomist  , started Spiriva    11/10/2015 Follow up : COPD GOLD III  Pt returns for 1 month follow up .  Does not like the nebs, makes mouth sore . She has been on budesoinde and perforomist . Last visit started on Spiriva . Felt it might have helped with breathing some but Insurance will not cover Spiriva.  Wants to try [facility]. Insurance will cover this.  Chest xray in July with chronic change . Echo in June with preserved EF .  She denies chest pain, orthopnea, edema or fever.    Current Medications, Allergies, Complete Past Medical History, Past Surgical History, Family History, and Social History were reviewed in [employer] record.  ROS  The following are not active complaints unless bolded sore throat, dysphagia, dental problems, itching, sneezing,  nasal congestion or excess/ purulent secretions, ear ache,   fever, chills, sweats, unintended wt loss, classically pleuritic or exertional cp,  orthopnea pnd or leg swelling, presyncope, palpitations, abdominal pain, anorexia, nausea, vomiting, diarrhea  or change in bowel or bladder habits, change in stools or urine, dysuria,hematuria,  rash, arthralgias, visual complaints, headache, numbness, weakness or ataxia or problems with walking or coordination,  change in mood/affect or memory.                 Objective:   Physical Exam   amb anxious/ horase  wf     Vital signs reviewed   HEENT: nl dentition, turbinates, and oropharynx. Nl external ear canals without cough reflex   NECK :  without JVD/Nodes/TM/ nl carotid upstrokes bilaterally   LUNGS:  no acc muscle use,  Minimal barrel  contour kyphotic chest which is clear to A and P bilaterally without cough on insp or exp maneuvers though bs distant s wheeze    CV:  RRR  no s3 or murmur or increase in P2, no edema   ABD:  soft and nontender with nl inspiratory excursion in the supine position. No bruits or organomegaly, bowel sounds nl  MS:  Nl gait/ ext warm without deformities, calf tenderness, cyanosis or clubbing No obvious joint restrictions   SKIN: warm and dry without lesions    NEURO:  alert, no focal deficits    CXR PA and Lateral:   09/17/2015 :     COPD. Mild interstitial prominence bilaterally slightly more conspicuous than in the past [place] reflect acute bronchitic change. Stable right basilar scarring on the right. No alveolar pneumonia nor CHF.   Labs ordered/ reviewed:   Lab Results  Component Value Date   TSH 0.06 (L) 10/08/2015              Betrice Wanat NP-C   Pulmonary and Critical Care  11/10/2015

## 2015-11-13 ENCOUNTER — Telehealth: Payer: Self-pay | Admitting: Internal Medicine

## 2015-11-13 NOTE — Telephone Encounter (Signed)
Called and spoke with pts daughter and she stated that the pt was seen by TP on Tuesday and was stopped on taking her neb meds and to use the albuterol HFA prn and use the anoro once daily.  Her daughter stated that this regimen is not working.  She is now using the HFA albuterol at least BID since stopping her neb meds.  pts daughter wanted to call and see what TP recs.  Please advise. Thanks  Allergies  Allergen Reactions  . Diclofenac     Hives  . Acetaminophen     Elevated LFTs  . Sulfa Antibiotics Nausea And Vomiting  . Ultram [Tramadol]     vomiting  . Fluvastatin Sodium     Lescol caused flu symptoms  . Meperidine Hcl     vomiting

## 2015-11-13 NOTE — Telephone Encounter (Signed)
Spoke with pt's daughter Olivia [name], aware of recs.   Pt's daughter states that these med changes were made because pt cannot afford Spiriva- is requesting an alternate inhaler for Spiriva.  Pt and daughter aware that response will be given tomorrow.    MW please provide alternative for spiriva.  Thanks.

## 2015-11-13 NOTE — Telephone Encounter (Signed)
TP is now gone. Will forward to MW for recs. Please advise thanks

## 2015-11-13 NOTE — Telephone Encounter (Signed)
Stop Budesonide and Perforomist nebs  Stop Spiriva  Begin ANORO 1 puff daily , rinse after use.  Use Albuterol inhaler or neb every 4h as needed. -this is your emergency medicine.  Labs today  Follow up with Primary for thyroid .    Would rec restart perforomist with spiriva and leave off the budesonide to see if that helps  Keep in mind if fails to respond to alb hfa/ fine to use the neb albuterol as a backup

## 2015-11-14 MED ORDER — IPRATROPIUM BROMIDE 0.02 % IN SOLN: 0.5000 mg | Freq: Four times a day (QID) | RESPIRATORY_TRACT | 5 refills | Status: DC

## 2015-11-14 NOTE — Telephone Encounter (Signed)
Pt called to report that Atrovent is on her formulary but it is a Tier 4. Informed pt that the Rx has already been sent to [place].  Pt voiced her understanding and will call the office back if the copay is too high for her.  Nothing further needed at this time; will sign off.

## 2015-11-14 NOTE — Telephone Encounter (Signed)
Called spoke with pt's daughter. Reviewed recs and verified pharmacy as [facility] on [address]. She states that she would like medication sent to the pharmacy and if it is not covered she will contact her mother's insurance to verify non-formulary list. She voiced understanding and had no further questions. Rx sent. Nothing further needed at this time.

## 2015-11-14 NOTE — Progress Notes (Signed)
Called spoke with pt's daughter. She voiced understanding and had no further questions.

## 2015-11-14 NOTE — Telephone Encounter (Signed)
Will need then atrovent qid instead per neb

## 2015-11-18 ENCOUNTER — Telehealth: Payer: Self-pay | Admitting: Internal Medicine

## 2015-11-18 NOTE — Telephone Encounter (Signed)
Patients daughter states that Olivia [name] has been using Performist once daily and Atrovent up to 4 times daily.  She said that those together are not working.  She said that Olivia [name]'s insurance only covers 1 dose of Albuterol per day.  She wants to know if her mom needs to be placed on oxygen?  Dr. Melvyn Novas, please advise.  Tanda Rockers, MD   5:13 PM  Note    Stop Budesonide and Perforomist nebs  Stop Spiriva  Begin ANORO 1 puff daily, rinse after use.  Use Albuterol inhaler or neb every 4h as needed. -this is your emergency medicine.  Labs today  Follow up with Primary for thyroid .    Would rec restart perforomist with spiriva and leave off the budesonide to see if that helps  Keep in mind if fails to respond to alb hfa/ fine to use the neb albuterol as a backup

## 2015-11-18 NOTE — Telephone Encounter (Signed)
Can't sort this out over the phone Ov with all meds to see NP and if none available add on to me but not Thursday pm

## 2015-11-18 NOTE — Telephone Encounter (Signed)
Called spoke with daughter. She wants pt to see MW only. appt scheduled for Friday at 9:15. Aware to bring all meds in hand.  nothing further needed

## 2015-11-21 ENCOUNTER — Ambulatory Visit (INDEPENDENT_AMBULATORY_CARE_PROVIDER_SITE_OTHER): Payer: Medicare Other | Admitting: Internal Medicine

## 2015-11-21 ENCOUNTER — Ambulatory Visit (INDEPENDENT_AMBULATORY_CARE_PROVIDER_SITE_OTHER)
Admission: RE | Admit: 2015-11-21 | Discharge: 2015-11-21 | Disposition: A | Discharge status: Home / Self Care | Payer: Medicare Other | Source: Ambulatory Visit | Attending: Internal Medicine | Admitting: Internal Medicine

## 2015-11-21 ENCOUNTER — Encounter: Payer: Self-pay | Admitting: Internal Medicine

## 2015-11-21 ENCOUNTER — Telehealth: Payer: Self-pay | Admitting: Internal Medicine

## 2015-11-21 VITALS — BP 94/62 | HR 68 | Ht 60.0 in | Wt 145.2 lb

## 2015-11-21 DIAGNOSIS — J449 Chronic obstructive pulmonary disease, unspecified: Secondary | ICD-10-CM

## 2015-11-21 DIAGNOSIS — E038 Other specified hypothyroidism: Secondary | ICD-10-CM

## 2015-11-21 MED ORDER — ALBUTEROL SULFATE HFA 108 (90 BASE) MCG/ACT IN AERS: INHALATION_SPRAY | RESPIRATORY_TRACT | 11 refills | Status: DC

## 2015-11-21 MED ORDER — FIRST-DUKES MOUTHWASH MT SUSP: 5.0000 mL | Freq: Three times a day (TID) | OROMUCOSAL | 1 refills | Status: DC

## 2015-11-21 MED ORDER — PREDNISONE 10 MG PO TABS: ORAL_TABLET | ORAL | 0 refills | Status: DC

## 2015-11-21 MED ORDER — LEVOTHYROXINE SODIUM 50 MCG PO TABS: ORAL_TABLET | ORAL | 3 refills | Status: AC

## 2015-11-21 NOTE — Progress Notes (Signed)
Subjective:  Patient ID: Olivia [name], female    DOB: 11-18-1931     MRN: [id number]   Brief patient profile: 21 yowf quit smoking 2009 previously followed in pulmonary clinic by [name] with  Dx copd with Spirometry  02/04/2012  C/w GOLD III criteria though f/v loop not typical.    History of Present Illness  07/03/2014 Dr [name] final note  Chief Complaint  Patient presents with  . Follow-up    Pt c/o hoarness started after taking incruise, SOB, dry cough x 1 month.   Pt hoarse with incruse dpi.  Pt remains dyspnea on exertion.    rec Prevnar 13 was given Stay on Incruse and Advair  Use albuterol as needed    02/05/2015  Extended transition of care f/u ov/Artesia Berkey re: establish care for copd Chief Complaint  Patient presents with  . Follow-up    Former pt of [name]. Pt still c/o hoarseness- relates to taking incruse. She has had increased cough for the past few days- non prod.  She is having some increased SOB today, and had to use ventolin for the first time in a month,   chronic  Doe x really winded when returns from mb to house  ok at rest/ main concern is chronic hoarseness and variable severe dry cough day >>noct rec Plan A = Stiolto 2 puffs each am Plan B - Only use your albuterol (ventolin) as a rescue medication  Plan C = crisis - Albuterol neb up to every 4 hours if you try your ventolin first and it doesn't work   02/27/2015  f/u ov/Jabori Henegar re: GOLD III with atypical f/v and upper airway component  Chief Complaint  Patient presents with  . Follow-up    Pt states she did well on stiolto- was coughing less and not as hoarse. She ran out of med after 2 wks and now symptoms are coming back, but are less severe.    doe = MMRC2 = can't walk a nl pace on a flat grade s sob/ rncrease need for saba p changed back to incruse rec Plan A =  Automatic Stiolto 2 puffs each am   (if they won't cover it the other option is BEVESPI up to 2 puffs every 12 hours and the other  option is nebulized formoterol/pulmicort thru part B medicare)    Plan B = Backup  - Only use your albuterol (ventolin) as a rescue medication Plan C = crisis - Albuterol neb up to every 4 hours if you try your ventolin first and it doesn't work    05/28/2015  f/u ov/Jalyne Brodzinski re: GOLD III / maint rx bevespi though hfa not ideal  Chief Complaint  Patient presents with  . Follow-up    Cough has improved some, but still throat clearing often.  Her hoarseness has resolved. She has only used rescue inhaler x 1 since the last visit and has not needed neb.   still doe = MMRC2 = can't walk a nl pace on a flat grade s sob but does fine slow and flat eg HT or WM or parking lot, but overall better tol   still urge to clear throat day >> noct or early am rec Work on inhaler technique:   Try prilosec(omeprazole) otc 20mg   Take 30-60 min before first meal of the day and tagamet (cimetidine) 400  Mg @  bedtime until cough is completely gone for at least a week without the need for cough suppression GERD  Continue Bivespi Take 2 puffs first thing in am and then another 2 puffs about 12 hours later.     09/17/2015 extended  f/u ov/Keirstin Musil re:  GOLD III no better on bevespi Chief Complaint  Patient presents with  . Acute Visit    Pt c/o increased SOB for the past 2 wks. She is using rescue inhaler 2 x daily on average and feels that this is no longer helping.  She also c/o increased cough which she relates to taking Bevespi. Cough is non prod.   states hasn't been to [facility] or [facility] in many years (note hx changed)  Most days goes to [place] and back and piddles along the way x ? How many years since you've been able to walk there and back s stopping:  A Never  answered occ at hs sob and takes ventolin and it doesn't work  Lots of upper airway tickling ? Taking gerd rx correctly/ unable to confirm  rec Plan A =   Automatically Performist and budesonide twice daily  Plan B =  Backup  - Only use your albuterol (ventolin)    Plan C = crisis - Albuterol neb up to every 4 hours if you try your ventolin first and it doesn't work  le.   10/08/2015  Extended acute ov/Jarren Para re: GOLD III  On perf/bud and multiple saba / very disorganized with meds Chief Complaint  Patient presents with  . Acute Visit    pt c/o worsening SOB, intermittent chest pains present X1 month.    no longer needing ventolin in middle of night  Neb twice daily  Can't use spiriva powder due to airway issue  Waking up at night hungry and eating meals "all day and night"  Doe = MMRC4  = sob if tries to leave home or while getting dressed  - sob room to room  >>buedsione and perforomist  , started Spiriva spray   11/10/2015  NP Follow up : COPD GOLD III   Does not like the nebs, makes mouth sore . She has been on budesoinde and perforomist . Last visit started on Spiriva . Felt it might have helped with breathing some but Insurance will not cover Spiriva.  Wants to try [facility]. Insurance will cover this.  Chest xray in July with chronic change . Echo in June with preserved EF .  rec Stop Budesonide and Perforomist nebs  Stop Spiriva  Begin ANORO 1 puff daily , rinse after use > could not tolerate Use Albuterol inhaler or neb every 4h as needed. -this is your emergency medicine.    Multiple phone calls re inability to tol or pay for meds > changed to duoneb qid    11/21/2015 acute extended ov/Reem Fleury re:  GOLD III thoroughly confused with meds, not on gerd rx any longer "don't have HB" Chief Complaint  Patient presents with  . Acute Visit    Pt states her breathing has been worse since last visit. She states unable to get dressed or go check the mailbox b/c she gets too SOB. She is using ventolin and albuterol nebs at least 2 x per day.  totally confused with maint vs prns and daughter not able to observe/ help with daily rx  Sob across the room now and wants 02  No obvious day to day or daytime variability or assoc excess/ purulent sputum or  mucus plugs or hemoptysis or cp or chest tightness, subjective wheeze or overt sinus or hb symptoms. No unusual exp  hx or h/o childhood pna/ asthma or knowledge of premature birth.    Also denies any obvious fluctuation of symptoms with weather or environmental changes or other aggravating or alleviating factors except as outlined above   Current Medications, Allergies, Complete Past Medical History, Past Surgical History, Family History, and Social History were reviewed in [employer] record.  ROS  The following are not active complaints unless bolded sore throat, dysphagia, dental problems, itching, sneezing,  nasal congestion or excess/ purulent secretions, ear ache,   fever, chills, sweats, unintended wt loss, classically pleuritic or exertional cp,  orthopnea pnd or leg swelling, presyncope, palpitations, abdominal pain, anorexia, nausea, vomiting, diarrhea  or change in bowel or bladder habits, change in stools or urine, dysuria,hematuria,  rash, arthralgias, visual complaints, headache, numbness, weakness or ataxia or problems with walking or coordination,  change in mood/affect or memory.                        Objective:   Physical Exam   amb anxious/ hoarse wf  Hopeless / helpless affect    Wt Readings from Last 3 Encounters:  11/21/15 145 lb 3.2 oz (65.9 kg)  11/10/15 143 lb (64.9 kg)  10/08/15 141 lb 9.6 oz (64.2 kg)    Vital signs reviewed - note Sats 92% RA on arrival   HEENT: nl dentition, turbinates, and oropharynx with minimal thrush. Nl external ear canals without cough reflex   NECK :  without JVD/Nodes/TM/ nl carotid upstrokes bilaterally   LUNGS: no acc muscle use,  Minimal barrel  contour kyphotic chest which is clear to A and P bilaterally without cough on insp or exp maneuvers though bs distant s wheeze / some pseudowheezes    CV:  RRR  no s3 or murmur or increase in P2, no edema   ABD:  soft and nontender with nl inspiratory  excursion in the supine position. No bruits or organomegaly, bowel sounds nl  MS:  Nl gait/ ext warm without deformities, calf tenderness, cyanosis or clubbing No obvious joint restrictions   SKIN: warm and dry without lesions    NEURO:  alert, no focal deficits     CXR PA and Lateral:   11/21/2015 :    I personally reviewed images and agree with radiology impression as follows:     COPD. Bibasilar atelectasis or scarring. Stable cardiomegaly. No acute pneumonia nor CHF.

## 2015-11-21 NOTE — Patient Instructions (Addendum)
Plan A = Automatic = ipatropium /albuterol nebulizer first thing in am and again about every 4-6 hours whether you think you need it or not   Pantoprazole (protonix) 40 mg   Take  30-60 min before first meal of the day (when you take your thyroxin) and Pepcid (famotidine)  20 mg one @  bedtime until return to office - this is the best way to tell whether stomach acid is contributing to your problem.     Plan B = Backup Only use your albuterol (ventolin/proair) as a rescue medication to be used if you can't catch your breath by resting or doing a relaxed purse lip breathing pattern.  - The less you use it, the better it will work when you need it. - Ok to use the inhaler up to 2 puffs  every 4 hours if you must but call for appointment if use goes up over your usual need - Don't leave home without it !!  (think of it like the spare tire for your car)    Plan C = Crisis - only use your albuterol nebulizer up to every 2 hours if you first try Plan B and it fails to help > ok to use the nebulizer up to every 4 hours but if start needing it regularly call for immediate appointment    Prednisone 10 mg take  4 each am x 2 days,   2 each am x 2 days,  1 each am x 2 days and stop    See Tammy NP w/in 2 weeks with all your medications, even over the counter meds, separated in two separate bags, the ones you take no matter what vs the ones you stop once you feel better and take only as needed when you feel you need them.   Tammy  will generate for you a new user friendly medication calendar that will put [place] all on the same page re: your medication use.     Without this process, it simply isn't possible to assure that we are providing  your outpatient care  with  the attention to detail we feel you deserve.   If we cannot assure that you're getting that kind of care,  then we cannot manage your problem effectively from this clinic.  Once you have seen Tammy and we are sure that we're all on the same page  with your medication use she will arrange follow up with me.  Late add? now on synthroid 50 mcg one half daily, need to verify this is correct and recheck tsh next ov

## 2015-11-22 ENCOUNTER — Encounter: Payer: Self-pay | Admitting: Internal Medicine

## 2015-11-22 NOTE — Assessment & Plan Note (Signed)
tsh back to detectable range and now on synthroid 50 mcg one half daily, need to verify this is correct and recheck tsh next ov

## 2015-11-22 NOTE — Assessment & Plan Note (Addendum)
Spirometry 02/04/2012:   FEV1 1.01(48%) ratio 48 with atypical features in the effort dep portion  - 02/05/2015  extensive coaching HFA effectiveness =    75% with respimat > try stiolto > improved but insurance wouldn't pay  - 03/11/15 changed to [place] due to insurance - 05/28/2015  extensive coaching HFA effectiveness =    75% - 09/17/2015   rec trial of perf/bud  - 10/08/2015   Walked RA  2 laps @ 185 ft each stopped due to sob/ unsteady of feet/ fatigue but no desats  - 10/08/2015 added spiriva respimat > could not afford it  - 11/21/2015  After extensive coaching HFA effectiveness =    90%  - 11/21/2015   Walked RA  2 laps @ 185 ft each stopped due to  Hip pain, a bit unsteady on feet, no sob or desat, moderate pace - Spirometry 11/21/2015  FEV1 0.70 (47%)  Ratio 45 with  Non physiologic contour only in effort dep portion    Symptoms are markedly disproportionate to objective findings and not clear this is a lung problem but pt does appear to have difficult airway management issues. DDX of  difficult airways management almost all start with A and  include Adherence, Ace Inhibitors, Acid Reflux, Active Sinus Disease, Alpha 1 Antitripsin deficiency, Anxiety masquerading as Airways dz,  ABPA,  Allergy(esp in young), Aspiration (esp in elderly), Adverse effects of meds,  Active smokers, A bunch of PE's (a small clot burden can't cause this syndrome unless there is already severe underlying pulm or vascular dz with poor reserve) plus two Bs  = Bronchiectasis and Beta blocker use..and one C= CHF    Adherence is always the initial "prime suspect" and is a multilayered concern that requires a "trust but verify" approach in every patient - starting with knowing how to use medications, especially inhalers, correctly, keeping up with refills and understanding the fundamental difference between maintenance and prns vs those medications only taken for a very short course and then stopped and not refilled.  - - The  proper method of use, as well as anticipated side effects, of a metered-dose inhaler are discussed and demonstrated to the patient. Improved effectiveness after extensive coaching during this visit to a level of approximately 90 % from a baseline of 75 % > ok to continue ventolin prn  - med reconciliation will be a major challenge going forward.   To keep things simple, I have asked the patient to first separate medicines that are perceived as maintenance, that is to be taken daily "no matter what", from those medicines that are taken on only on an as-needed basis and I have given the patient examples of both, and then return to see our NP to generate a  detailed  medication calendar which should be followed until the next physician sees the patient and updates it.     ? Acid (or non-acid) GERD > always difficult to exclude as up to 75% of pts in some series report no assoc GI/ Heartburn symptoms> rec max (24h)  acid suppression and diet restrictions/ reviewed and instructions given in writing.    ? Allergy /asthma component > Prednisone 10 mg take  4 each am x 2 days,   2 each am x 2 days,  1 each am x 2 days and stop   ? Anxiety/depression  > usually at the bottom of this list of usual suspects but should be much higher on this pt's based on H and  P and note already on psychotropics .    Total time devoted to counseling  = 30/34m review case with pt/ daughter discussion of options/alternatives/ personally creating written instructions  in presence of pt  then going over those specific  Instructions directly with the pt including how to use all of the meds but in particular covering each new medication in detail and the difference between the maintenance/automatic meds and the prns using an action plan format for the latter.

## 2015-11-24 NOTE — Progress Notes (Signed)
lmtcb

## 2015-11-24 NOTE — Telephone Encounter (Signed)
lmtcb x1 

## 2015-11-24 NOTE — Progress Notes (Signed)
Left detailed msg on her machine

## 2015-11-25 MED ORDER — NYSTATIN 100000 UNIT/ML MT SUSP: 10.0000 mL | Freq: Four times a day (QID) | OROMUCOSAL | 0 refills | Status: DC

## 2015-11-25 NOTE — Telephone Encounter (Signed)
Spoke with Olivia [name]. She is aware of MW's recommendation. Rx has been sent in. Nothing further was needed.

## 2015-11-25 NOTE — Telephone Encounter (Signed)
There is no exact subsitute, can try nystatin suspension  4 oz 2 tsp qid prn

## 2015-11-25 NOTE — Telephone Encounter (Signed)
Spoke with pt's daughter. MMW is not covered by pt's insurance, they will need an alternative. The other 2 prescriptions were received by the pharmacy.  MW - please advise on MMW alternative. Thanks.

## 2015-11-26 ENCOUNTER — Other Ambulatory Visit: Payer: Self-pay | Admitting: Critical Care Medicine

## 2015-12-04 ENCOUNTER — Telehealth: Payer: Self-pay | Admitting: Internal Medicine

## 2015-12-04 DIAGNOSIS — J449 Chronic obstructive pulmonary disease, unspecified: Secondary | ICD-10-CM

## 2015-12-04 MED ORDER — ALBUTEROL SULFATE (2.5 MG/3ML) 0.083% IN NEBU: 2.5000 mg | INHALATION_SOLUTION | Freq: Four times a day (QID) | RESPIRATORY_TRACT | 12 refills | Status: AC | PRN

## 2015-12-04 NOTE — Telephone Encounter (Signed)
Patients daughter called because patient is out of her albuterol neb medication. She said that patient gets the medication through [facility] but has run out.  Called [facility] and they agreed to fill a box for patient until she gets her next shipment.  Patient's daughter aware. Nothing further needed.

## 2015-12-08 ENCOUNTER — Encounter: Payer: Medicare Other | Admitting: Adult Health

## 2015-12-09 ENCOUNTER — Encounter: Payer: Self-pay | Admitting: Internal Medicine

## 2015-12-09 ENCOUNTER — Other Ambulatory Visit (INDEPENDENT_AMBULATORY_CARE_PROVIDER_SITE_OTHER): Payer: Medicare Other

## 2015-12-09 ENCOUNTER — Ambulatory Visit (INDEPENDENT_AMBULATORY_CARE_PROVIDER_SITE_OTHER)
Admission: RE | Admit: 2015-12-09 | Discharge: 2015-12-09 | Disposition: A | Discharge status: Home / Self Care | Payer: Medicare Other | Source: Ambulatory Visit | Attending: Internal Medicine | Admitting: Internal Medicine

## 2015-12-09 ENCOUNTER — Ambulatory Visit (INDEPENDENT_AMBULATORY_CARE_PROVIDER_SITE_OTHER): Payer: Medicare Other | Admitting: Internal Medicine

## 2015-12-09 ENCOUNTER — Other Ambulatory Visit: Payer: Self-pay | Admitting: Internal Medicine

## 2015-12-09 VITALS — BP 92/60 | HR 70 | Ht 60.0 in | Wt 141.6 lb

## 2015-12-09 DIAGNOSIS — E039 Hypothyroidism, unspecified: Secondary | ICD-10-CM | POA: Diagnosis not present

## 2015-12-09 DIAGNOSIS — R0600 Dyspnea, unspecified: Secondary | ICD-10-CM

## 2015-12-09 DIAGNOSIS — J9611 Chronic respiratory failure with hypoxia: Secondary | ICD-10-CM

## 2015-12-09 DIAGNOSIS — R7989 Other specified abnormal findings of blood chemistry: Secondary | ICD-10-CM

## 2015-12-09 DIAGNOSIS — J449 Chronic obstructive pulmonary disease, unspecified: Secondary | ICD-10-CM | POA: Diagnosis not present

## 2015-12-09 LAB — CBC WITH DIFFERENTIAL/PLATELET
BASOS PCT: 0.7 % (ref 0.0–3.0)
Basophils Absolute: 0 10*3/uL (ref 0.0–0.1)
EOS PCT: 4.6 % (ref 0.0–5.0)
Eosinophils Absolute: 0.2 10*3/uL (ref 0.0–0.7)
HEMATOCRIT: 36.8 % (ref 36.0–46.0)
Hemoglobin: 12.6 g/dL (ref 12.0–15.0)
LYMPHS ABS: 1.2 10*3/uL (ref 0.7–4.0)
Lymphocytes Relative: 23.1 % (ref 12.0–46.0)
MCHC: 34.4 g/dL (ref 30.0–36.0)
MCV: 92.7 fl (ref 78.0–100.0)
MONOS PCT: 6.5 % (ref 3.0–12.0)
Monocytes Absolute: 0.3 10*3/uL (ref 0.1–1.0)
NEUTROS ABS: 3.3 10*3/uL (ref 1.4–7.7)
NEUTROS PCT: 65.1 % (ref 43.0–77.0)
PLATELETS: 155 10*3/uL (ref 150.0–400.0)
RBC: 3.97 Mil/uL (ref 3.87–5.11)
RDW: 15.3 % (ref 11.5–15.5)
WBC: 5.1 10*3/uL (ref 4.0–10.5)

## 2015-12-09 MED ORDER — FLUTTER DEVI: 0 refills | Status: DC

## 2015-12-09 MED ORDER — FAMOTIDINE 20 MG PO TABS: ORAL_TABLET | ORAL | Status: AC

## 2015-12-09 MED ORDER — FLUTICASONE-SALMETEROL 250-50 MCG/DOSE IN AEPB: INHALATION_SPRAY | RESPIRATORY_TRACT | 11 refills | Status: AC

## 2015-12-09 MED ORDER — UMECLIDINIUM BROMIDE 62.5 MCG/INH IN AEPB: 1.0000 | INHALATION_SPRAY | Freq: Every day | RESPIRATORY_TRACT | 0 refills | Status: AC

## 2015-12-09 NOTE — Patient Instructions (Addendum)
Plan A = Automatic = First thing in AM  Advair 250 followed by Incruse one each am  and repeat just  the advair 250 mg one puff x   12 hours later   Pantoprazole (protonix) 40 mg   Take  30-60 min before first meal of the day (when you take your thyroxin) and Pepcid (famotidine)  20 mg one @  bedtime until return to office - this is the best way to tell whether stomach acid is contributing to your problem.     Plan B = Backup Only use your albuterol (ventolin)  as a rescue medication to be used if you can't catch your breath by resting or doing a relaxed purse lip breathing pattern.  - The less you use it, the better it will work when you need it. - Ok to use the inhaler up to 2 puffs  every 3 hours if you must but call for appointment if use goes up over your usual need - Don't leave home without it !!  (think of it like the spare tire for your car)    Plan C = Crisis - only use your albuterol nebulizer up to every 2 hours if you first try Plan B and it fails to help > ok to use the nebulizer up to every 4 hours but if start needing it regularly call for immediate appointment    For cough mucinex dm >  Up to 1200 mg every 12 hours and use the flutter valve as much as you can   Please remember to go to the lab  department downstairs for your tests - we will call you with the results when they are available.  See Tammy NP  In 2 weeks with all your medications, even over the counter meds, separated in two separate bags, the ones you take no matter what vs the ones you stop once you feel better and take only as needed when you feel you need them.   Tammy  will generate for you a new user friendly medication calendar that will put [place] all on the same page re: your medication use.     Without this process, it simply isn't possible to assure that we are providing  your outpatient care  with  the attention to detail we feel you deserve.   If we cannot assure that you're getting that kind of care,   then we cannot manage your problem effectively from this clinic.  Once you have seen Tammy and we are sure that we're all on the same page with your medication use she will arrange follow up with me.

## 2015-12-09 NOTE — Progress Notes (Signed)
Subjective:  Patient ID: Olivia [name], female    DOB: 11-18-1931     MRN: [id number]   Brief patient profile: 21 yowf quit smoking 2009 previously followed in pulmonary clinic by [name] with  Dx copd with Spirometry  02/04/2012  C/w GOLD III criteria though f/v loop not typical.    History of Present Illness  07/03/2014 Dr [name] final note  Chief Complaint  Patient presents with  . Follow-up    Pt c/o hoarness started after taking incruise, SOB, dry cough x 1 month.   Pt hoarse with incruse dpi.  Pt remains dyspnea on exertion.    rec Prevnar 13 was given Stay on Incruse and Advair  Use albuterol as needed    02/05/2015  Extended transition of care f/u ov/Olivia [name] re: establish care for copd Chief Complaint  Patient presents with  . Follow-up    Former pt of [name]. Pt still c/o hoarseness- relates to taking incruse. She has had increased cough for the past few days- non prod.  She is having some increased SOB today, and had to use ventolin for the first time in a month,   chronic  Doe x really winded when returns from [place] to house  ok at rest/ main concern is chronic hoarseness and variable severe dry cough day >>noct rec Plan A = Stiolto 2 puffs each am Plan B - Only use your albuterol (ventolin) as a rescue medication  Plan C = crisis - Albuterol neb up to every 4 hours if you try your ventolin first and it doesn't work   02/27/2015  f/u ov/Olivia [name] re: GOLD III with atypical f/v and upper airway component  Chief Complaint  Patient presents with  . Follow-up    Pt states she did well on stiolto- was coughing less and not as hoarse. She ran out of med after 2 wks and now symptoms are coming back, but are less severe.    doe = MMRC2 = can't walk a nl pace on a flat grade s sob/ rncrease need for saba p changed back to incruse rec Plan A =  Automatic Stiolto 2 puffs each am   (if they won't cover it the other option is BEVESPI up to 2 puffs every 12 hours and the other  option is nebulized formoterol/pulmicort thru part B medicare)    Plan B = Backup  - Only use your albuterol (ventolin) as a rescue medication Plan C = crisis - Albuterol neb up to every 4 hours if you try your ventolin first and it doesn't work    05/28/2015  f/u ov/Olivia [name] re: GOLD III / maint rx bevespi though hfa not ideal  Chief Complaint  Patient presents with  . Follow-up    Cough has improved some, but still throat clearing often.  Her hoarseness has resolved. She has only used rescue inhaler x 1 since the last visit and has not needed neb.   still doe = MMRC2 = can't walk a nl pace on a flat grade s sob but does fine slow and flat eg HT or [place] or parking lot, but overall better tol   still urge to clear throat day >> noct or early am rec Work on inhaler technique:   Try prilosec(omeprazole) otc 20mg   Take 30-60 min before first meal of the day and tagamet (cimetidine) 400  Mg @  bedtime until cough is completely gone for at least a week without the need for cough suppression GERD  Continue Bivespi Take 2 puffs first thing in am and then another 2 puffs about 12 hours later.     09/17/2015 extended  f/u ov/Olivia [name] re:  GOLD III no better on bevespi Chief Complaint  Patient presents with  . Acute Visit    Pt c/o increased SOB for the past 2 wks. She is using rescue inhaler 2 x daily on average and feels that this is no longer helping.  She also c/o increased cough which she relates to taking Bevespi. Cough is non prod.   states hasn't been to [place] or [facility] in many years (note hx changed)  Most days goes to [facility] and back and piddles along the way x ? How many years since you've been able to walk there and back s stopping:  A Never  answered occ at hs sob and takes ventolin and it doesn't work  Lots of upper airway tickling ? Taking gerd rx correctly/ unable to confirm  rec Plan A =  Automatically Performist and budesonide twice daily  Plan B =  Backup  - Only use your albuterol (ventolin)    Plan C = crisis - Albuterol neb up to every 4 hours if you try your ventolin first and it doesn't work     10/08/2015  Extended acute ov/Olivia [name] re: GOLD III  On perf/bud and multiple saba / very disorganized with meds Chief Complaint  Patient presents with  . Acute Visit    pt c/o worsening SOB, intermittent chest pains present X1 month.    no longer needing ventolin in middle of night  Neb twice daily  Can't use spiriva powder due to airway issue  Waking up at night hungry and eating meals "all day and night"  Doe = MMRC4  = sob if tries to leave home or while getting dressed  - sob room to room  >>buedsione and perforomist  , started Spiriva spray   11/10/2015  NP Follow up : COPD GOLD III   Does not like the nebs, makes mouth sore . She has been on budesoinde and perforomist . Last visit started on Spiriva . Felt it might have helped with breathing some but Insurance will not cover Spiriva.  Wants to try [facility]. Insurance will cover this.  Chest xray in July with chronic change . Echo in June with preserved EF .  rec Stop Budesonide and Perforomist nebs  Stop Spiriva  Begin ANORO 1 puff daily , rinse after use > could not tolerate Use Albuterol inhaler or neb every 4h as needed. -this is your emergency medicine.    Multiple phone calls re inability to tol or pay for meds > changed to duoneb qid    11/21/2015 acute extended ov/Krystn [name] re:  GOLD III thoroughly confused with meds, not on gerd rx any longer "don't have HB" Chief Complaint  Patient presents with  . Acute Visit    Pt states her breathing has been worse since last visit. She states unable to get dressed or go check the mailbox b/c she gets too SOB. She is using ventolin and albuterol nebs at least 2 x per day.  totally confused with maint vs prns and daughter not able to observe/ help with daily rx  Sob across the room now and wants 02 rec Plan A = Automatic = ipatropium /albuterol nebulizer first thing in am and again  about every 4-6 hours whether you think you need it or not  Pantoprazole (protonix) 40 mg   Take  30-60 min before first meal of the day (when you take your thyroxin) and Pepcid (famotidine)  20 mg one @  bedtime until return to office - this is the best way to tell whether stomach acid is contributing to your problem.   Plan B = Backup Only use your albuterol (ventolin/proair) as a rescue medication Plan C = Crisis - only use your albuterol nebulizer up to every 2 hours if you first try Plan B and it fails to help > ok to use the nebulizer up to every 4 hours but if start needing it regularly call for immediate appointment  Prednisone 10 mg take  4 each am x 2 days,   2 each am x 2 days,  1 each am x 2 days and stop       12/09/2015 acute extended ov/Kharma Sampsel re:  GOLD III / worse sob  Chief Complaint  Patient presents with  . Acute Visit    Pt states " I feel addicted to albuterol"- feels like she is suffocating unless she uses the neb every 6 hours.  She states that she is "panting" until it is time to use her albuterol. She is not really using the ventolin inhaler b/c she does not feel she is strong enough.     confused again between maint and prn meds / not able to keep appt for med reconciliation   No obvious day to day or daytime variability or assoc excess/ purulent sputum or mucus plugs or hemoptysis or cp or chest tightness, subjective wheeze or overt sinus or hb symptoms. No unusual exp hx or h/o childhood pna/ asthma or knowledge of premature birth.    Also denies any obvious fluctuation of symptoms with weather or environmental changes or other aggravating or alleviating factors except as outlined above   Current Medications, Allergies, Complete Past Medical History, Past Surgical History, Family History, and Social History were reviewed in [employer] record.  ROS  The following are not active complaints unless bolded sore throat, dysphagia, dental problems,  itching, sneezing,  nasal congestion or excess/ purulent secretions, ear ache,   fever, chills, sweats, unintended wt loss, classically pleuritic or exertional cp,  orthopnea pnd or leg swelling, presyncope, palpitations, abdominal pain, anorexia, nausea, vomiting, diarrhea  or change in bowel or bladder habits, change in stools or urine, dysuria,hematuria,  rash, arthralgias, visual complaints, headache, numbness, weakness or ataxia or problems with walking or coordination,  change in mood/affect or memory.                        Objective:   Physical Exam   amb anxious/ hoarse wf  Hopeless / helpless affect  12/13/2015      142   11/21/15 145 lb 3.2 oz (65.9 kg)  11/10/15 143 lb (64.9 kg)  10/08/15 141 lb 9.6 oz (64.2 kg)    Vital signs reviewed - note Sats 91% RA on arrival   HEENT: nl dentition, turbinates, and oropharynx with minimal thrush. Nl external ear canals without cough reflex   NECK :  without JVD/Nodes/TM/ nl carotid upstrokes bilaterally   LUNGS: no acc muscle use,  Minimal barrel  contour kyphotic chest which is clear to A and P bilaterally without cough on insp or exp maneuvers though bs distant s wheeze / some pseudowheezes    CV:  RRR  no s3 or murmur or increase in P2, no edema   ABD:  soft and nontender with  nl inspiratory excursion in the supine position. No bruits or organomegaly, bowel sounds nl  MS:  Nl gait/ ext warm without deformities, calf tenderness, cyanosis or clubbing No obvious joint restrictions   SKIN: warm and dry without lesions    NEURO:  alert, no focal deficits    CXR PA and Lateral:   12/09/2015 :    I personally reviewed images and agree with radiology impression as follows:    Stable bibasilar linear scarring or atelectasis. No definite active process. Hyperaeration suggesting emphysema.    Labs ordered/ reviewed:      Chemistry      Component Value Date/Time   NA 141 12/09/2015 1735   NA 142 12/09/2015 1735   K 4.0  12/09/2015 1735   K 4.0 12/09/2015 1735   CL 102 12/09/2015 1735   CL 103 12/09/2015 1735   CO2 31 12/09/2015 1735   CO2 27 12/09/2015 1735   BUN 24 (H) 12/09/2015 1735   BUN 25 (H) 12/09/2015 1735   CREATININE 1.39 (H) 12/09/2015 1735   CREATININE 1.39 (H) 12/09/2015 1735      Component Value Date/Time   CALCIUM 9.3 12/09/2015 1735   CALCIUM 9.3 12/09/2015 1735   ALKPHOS 84 12/09/2015 1735   AST 36 12/09/2015 1735   ALT 28 12/09/2015 1735   BILITOT 0.4 12/09/2015 1735        Lab Results  Component Value Date   WBC 5.1 12/09/2015   HGB 12.6 12/09/2015   HCT 36.8 12/09/2015   MCV 92.7 12/09/2015   PLT 155.0 12/09/2015       Lab Results  Component Value Date   TSH 0.87 12/09/2015     Lab Results  Component Value Date   PROBNP 31.0 12/09/2015

## 2015-12-10 ENCOUNTER — Telehealth: Payer: Self-pay | Admitting: Internal Medicine

## 2015-12-10 LAB — COMPREHENSIVE METABOLIC PANEL
ALT: 28 U/L (ref 0–35)
AST: 36 U/L (ref 0–37)
Albumin: 3.8 g/dL (ref 3.5–5.2)
Alkaline Phosphatase: 84 U/L (ref 39–117)
BUN: 24 mg/dL — ABNORMAL HIGH (ref 6–23)
CALCIUM: 9.3 mg/dL (ref 8.4–10.5)
CHLORIDE: 102 meq/L (ref 96–112)
CO2: 31 meq/L (ref 19–32)
CREATININE: 1.39 mg/dL — AB (ref 0.40–1.20)
GFR: 38.52 mL/min — ABNORMAL LOW (ref >60.00)
Glucose, Bld: 91 mg/dL (ref 70–99)
POTASSIUM: 4 meq/L (ref 3.5–5.1)
Sodium: 141 mEq/L (ref 135–145)
Total Bilirubin: 0.4 mg/dL (ref 0.2–1.2)
Total Protein: 7.5 g/dL (ref 6.0–8.3)

## 2015-12-10 LAB — RESPIRATORY ALLERGY PROFILE REGION II ~~LOC~~
Allergen, A. alternata, m6: <0.1 kU/L
Allergen, C. Herbarum, M2: <0.1 kU/L
Allergen, D pternoyssinus,d7: <0.1 kU/L
Allergen, Mulberry, t76: <0.1 kU/L
Allergen, Oak,t7: <0.1 kU/L
Aspergillus fumigatus, m3: <0.1 kU/L
Cat Dander: 0.13 kU/L — ABNORMAL HIGH
Common Ragweed: <0.1 kU/L
D. farinae: <0.1 kU/L
Dog Dander: 0.14 kU/L — ABNORMAL HIGH
IGE (IMMUNOGLOBULIN E), SERUM: 142 kU/L — AB (ref <115)
Johnson Grass: <0.1 kU/L
Pecan/Hickory Tree IgE: <0.1 kU/L
Rough Pigweed  IgE: <0.1 kU/L
Timothy Grass: <0.1 kU/L

## 2015-12-10 LAB — BASIC METABOLIC PANEL
BUN: 25 mg/dL — AB (ref 6–23)
CHLORIDE: 103 meq/L (ref 96–112)
CO2: 27 meq/L (ref 19–32)
Calcium: 9.3 mg/dL (ref 8.4–10.5)
Creatinine, Ser: 1.39 mg/dL — ABNORMAL HIGH (ref 0.40–1.20)
GFR: 38.52 mL/min — AB (ref >60.00)
GLUCOSE: 88 mg/dL (ref 70–99)
POTASSIUM: 4 meq/L (ref 3.5–5.1)
Sodium: 142 mEq/L (ref 135–145)

## 2015-12-10 LAB — TSH: TSH: 0.87 u[IU]/mL (ref 0.35–4.50)

## 2015-12-10 LAB — BRAIN NATRIURETIC PEPTIDE: Pro B Natriuretic peptide (BNP): 31 pg/mL (ref 0.0–100.0)

## 2015-12-10 NOTE — Progress Notes (Signed)
Spoke with pt and notified of results per Dr. Wert. Pt verbalized understanding and denied any questions. 

## 2015-12-10 NOTE — Telephone Encounter (Signed)
Olivia Rockers, MD sent to Olivia [name], [place]        Call patient : Studies are pos allergy only to dog /cat > avoid, no other new recs    Olivia Rockers, MD sent to Olivia [name], [place]        Call pt: Reviewed cxr and no acute change so no change in recommendations made at [facility] with pt and notified of results per Dr. Melvyn Novas. Pt verbalized understanding and denied any questions.

## 2015-12-13 NOTE — Assessment & Plan Note (Addendum)
Spirometry 02/04/2012:   FEV1 1.01(48%) ratio 48 with atypical features in the effort dep portion  - 02/05/2015  extensive coaching HFA effectiveness =    75% with respimat > try stiolto > improved but insurance wouldn't pay  - 03/11/15 changed to [place] due to insurance - 05/28/2015  extensive coaching HFA effectiveness =    75% - 09/17/2015   rec trial of perf/bud  - 10/08/2015   Walked RA  2 laps @ 185 ft each stopped due to sob/ unsteady of feet/ fatigue but no desats  - 10/08/2015 added spiriva respimat > could not afford it  - 11/21/2015  After extensive coaching HFA effectiveness =    90%  - 11/21/2015   Walked RA  2 laps @ 185 ft each stopped due to  Hip pain, a bit unsteady on feet, no sob or desat, moderate pace - Spirometry 11/21/2015  FEV1 0.70 (47%)  Ratio 45 with  Non physiologic contour only in effort dep portion  - 12/09/2015  Instructed on dpi/ changed to incruse one each am/ advair 250 one bid    I had an extended discussion with the patient and daughter reviewing all relevant studies completed to date and  lasting 15 to 20 minutes of a 25 minute visit    Med reconciliation remains a major challenge.  To keep things simple, I have asked the patient to first separate medicines that are perceived as maintenance, that is to be taken daily "no matter what", from those medicines that are taken on only on an as-needed basis and I have given the patient examples of both, and then return to see our NP to generate a  detailed  medication calendar which should be followed until the next physician sees the patient and updates it.    Each maintenance medication was reviewed in detail including most importantly the difference between maintenance and prns and under what circumstances the prns are to be triggered using an action plan format that is not reflected in the computer generated alphabetically organized AVS.    Please see instructions for details which were reviewed in writing and the patient  given a copy highlighting the part that I personally wrote and discussed at today's ov.

## 2015-12-13 NOTE — Assessment & Plan Note (Signed)
No evidence chf/ thyroid dz/ anemia though I also suspect significant deconditioning

## 2015-12-13 NOTE — Assessment & Plan Note (Signed)
12/09/2015  Patient Saturations on Room Air at Rest = 91% Patient Saturations on Room Air while Ambulating = 84% Patient Saturations on 2 Liters of oxygen while Ambulating = 90%  rec as of 12/09/2015   2lpm walking   rec ono on RA also

## 2015-12-15 ENCOUNTER — Telehealth: Payer: Self-pay | Admitting: Internal Medicine

## 2015-12-15 NOTE — Telephone Encounter (Signed)
Bring her on 

## 2015-12-15 NOTE — Telephone Encounter (Signed)
Called and spoke to pt's daughter, Eustaquio Maize. Beth states the pt is requesting to change providers from MW to BQ. A tentative appt has been made with BQ on 01/07/2016 at 10:15 - Beth aware this could be canceled. Pt currently having SOB, appt scheduled to see TP on 12/16/2015 at 10:15.   Dr. Melvyn Novas, ok for pt to change to BQ?  Dr. Lake Bells, ok to take pt?

## 2015-12-15 NOTE — Telephone Encounter (Signed)
Already has appt with TP  BQ- please advise if you are okay with seeing her, thanks

## 2015-12-15 NOTE — Telephone Encounter (Signed)
Fine with me but should still see Tammy First so she can make sure there are not handoff issues related to accuracy of med list

## 2015-12-16 ENCOUNTER — Encounter (HOSPITAL_COMMUNITY): Payer: Self-pay | Admitting: Emergency Medicine

## 2015-12-16 ENCOUNTER — Encounter: Payer: Self-pay | Admitting: Adult Health

## 2015-12-16 ENCOUNTER — Emergency Department (HOSPITAL_COMMUNITY): Payer: Medicare Other

## 2015-12-16 ENCOUNTER — Inpatient Hospital Stay (HOSPITAL_COMMUNITY)
Admission: EM | Admit: 2015-12-16 | Discharge: 2015-12-24 | DRG: 291 | Disposition: A | Discharge status: Hospice | Payer: Medicare Other | Attending: Internal Medicine | Admitting: Internal Medicine

## 2015-12-16 ENCOUNTER — Telehealth: Payer: Self-pay | Admitting: Adult Health

## 2015-12-16 ENCOUNTER — Ambulatory Visit (INDEPENDENT_AMBULATORY_CARE_PROVIDER_SITE_OTHER): Payer: Medicare Other | Admitting: Adult Health

## 2015-12-16 DIAGNOSIS — J9622 Acute and chronic respiratory failure with hypercapnia: Secondary | ICD-10-CM | POA: Diagnosis present

## 2015-12-16 DIAGNOSIS — M5416 Radiculopathy, lumbar region: Secondary | ICD-10-CM | POA: Diagnosis present

## 2015-12-16 DIAGNOSIS — R0602 Shortness of breath: Secondary | ICD-10-CM

## 2015-12-16 DIAGNOSIS — J9621 Acute and chronic respiratory failure with hypoxia: Secondary | ICD-10-CM | POA: Diagnosis present

## 2015-12-16 DIAGNOSIS — F419 Anxiety disorder, unspecified: Secondary | ICD-10-CM | POA: Diagnosis present

## 2015-12-16 DIAGNOSIS — Z7982 Long term (current) use of aspirin: Secondary | ICD-10-CM

## 2015-12-16 DIAGNOSIS — R0789 Other chest pain: Secondary | ICD-10-CM | POA: Diagnosis not present

## 2015-12-16 DIAGNOSIS — J9859 Other diseases of mediastinum, not elsewhere classified: Secondary | ICD-10-CM | POA: Diagnosis not present

## 2015-12-16 DIAGNOSIS — R0603 Acute respiratory distress: Secondary | ICD-10-CM

## 2015-12-16 DIAGNOSIS — R079 Chest pain, unspecified: Secondary | ICD-10-CM | POA: Diagnosis not present

## 2015-12-16 DIAGNOSIS — R0600 Dyspnea, unspecified: Secondary | ICD-10-CM

## 2015-12-16 DIAGNOSIS — Z87891 Personal history of nicotine dependence: Secondary | ICD-10-CM

## 2015-12-16 DIAGNOSIS — J9611 Chronic respiratory failure with hypoxia: Secondary | ICD-10-CM | POA: Diagnosis not present

## 2015-12-16 DIAGNOSIS — M6281 Muscle weakness (generalized): Secondary | ICD-10-CM

## 2015-12-16 DIAGNOSIS — R5381 Other malaise: Secondary | ICD-10-CM | POA: Diagnosis present

## 2015-12-16 DIAGNOSIS — R0902 Hypoxemia: Secondary | ICD-10-CM

## 2015-12-16 DIAGNOSIS — Z66 Do not resuscitate: Secondary | ICD-10-CM | POA: Diagnosis not present

## 2015-12-16 DIAGNOSIS — R222 Localized swelling, mass and lump, trunk: Secondary | ICD-10-CM | POA: Diagnosis present

## 2015-12-16 DIAGNOSIS — Z87440 Personal history of urinary (tract) infections: Secondary | ICD-10-CM

## 2015-12-16 DIAGNOSIS — J441 Chronic obstructive pulmonary disease with (acute) exacerbation: Secondary | ICD-10-CM | POA: Diagnosis present

## 2015-12-16 DIAGNOSIS — G8929 Other chronic pain: Secondary | ICD-10-CM | POA: Diagnosis present

## 2015-12-16 DIAGNOSIS — E871 Hypo-osmolality and hyponatremia: Secondary | ICD-10-CM

## 2015-12-16 DIAGNOSIS — F329 Major depressive disorder, single episode, unspecified: Secondary | ICD-10-CM | POA: Diagnosis present

## 2015-12-16 DIAGNOSIS — I5033 Acute on chronic diastolic (congestive) heart failure: Secondary | ICD-10-CM

## 2015-12-16 DIAGNOSIS — E878 Other disorders of electrolyte and fluid balance, not elsewhere classified: Secondary | ICD-10-CM | POA: Diagnosis present

## 2015-12-16 DIAGNOSIS — K219 Gastro-esophageal reflux disease without esophagitis: Secondary | ICD-10-CM | POA: Diagnosis present

## 2015-12-16 DIAGNOSIS — E222 Syndrome of inappropriate secretion of antidiuretic hormone: Secondary | ICD-10-CM | POA: Diagnosis present

## 2015-12-16 DIAGNOSIS — Z7951 Long term (current) use of inhaled steroids: Secondary | ICD-10-CM

## 2015-12-16 DIAGNOSIS — Z79899 Other long term (current) drug therapy: Secondary | ICD-10-CM

## 2015-12-16 DIAGNOSIS — J449 Chronic obstructive pulmonary disease, unspecified: Secondary | ICD-10-CM | POA: Diagnosis not present

## 2015-12-16 DIAGNOSIS — I110 Hypertensive heart disease with heart failure: Principal | ICD-10-CM | POA: Diagnosis present

## 2015-12-16 DIAGNOSIS — E785 Hyperlipidemia, unspecified: Secondary | ICD-10-CM | POA: Diagnosis present

## 2015-12-16 DIAGNOSIS — I10 Essential (primary) hypertension: Secondary | ICD-10-CM | POA: Diagnosis present

## 2015-12-16 DIAGNOSIS — D72819 Decreased white blood cell count, unspecified: Secondary | ICD-10-CM | POA: Diagnosis present

## 2015-12-16 DIAGNOSIS — D696 Thrombocytopenia, unspecified: Secondary | ICD-10-CM | POA: Diagnosis present

## 2015-12-16 DIAGNOSIS — I951 Orthostatic hypotension: Secondary | ICD-10-CM | POA: Diagnosis not present

## 2015-12-16 DIAGNOSIS — Z9981 Dependence on supplemental oxygen: Secondary | ICD-10-CM

## 2015-12-16 DIAGNOSIS — R109 Unspecified abdominal pain: Secondary | ICD-10-CM

## 2015-12-16 DIAGNOSIS — E039 Hypothyroidism, unspecified: Secondary | ICD-10-CM | POA: Diagnosis present

## 2015-12-16 DIAGNOSIS — J9690 Respiratory failure, unspecified, unspecified whether with hypoxia or hypercapnia: Secondary | ICD-10-CM

## 2015-12-16 DIAGNOSIS — Z515 Encounter for palliative care: Secondary | ICD-10-CM

## 2015-12-16 DIAGNOSIS — Z85048 Personal history of other malignant neoplasm of rectum, rectosigmoid junction, and anus: Secondary | ICD-10-CM

## 2015-12-16 DIAGNOSIS — E876 Hypokalemia: Secondary | ICD-10-CM | POA: Diagnosis not present

## 2015-12-16 LAB — CBC
HEMATOCRIT: 36.1 % (ref 36.0–46.0)
HEMOGLOBIN: 12.3 g/dL (ref 12.0–15.0)
MCH: 31.1 pg (ref 26.0–34.0)
MCHC: 34.1 g/dL (ref 30.0–36.0)
MCV: 91.4 fL (ref 78.0–100.0)
Platelets: 145 10*3/uL — ABNORMAL LOW (ref 150–400)
RBC: 3.95 MIL/uL (ref 3.87–5.11)
RDW: 14 % (ref 11.5–15.5)
WBC: 3.6 10*3/uL — AB (ref 4.0–10.5)

## 2015-12-16 LAB — BASIC METABOLIC PANEL
Anion gap: 7 (ref 5–15)
BUN: 16 mg/dL (ref 6–20)
CHLORIDE: 93 mmol/L — AB (ref 101–111)
CO2: 29 mmol/L (ref 22–32)
CREATININE: 0.89 mg/dL (ref 0.44–1.00)
Calcium: 9 mg/dL (ref 8.9–10.3)
GFR calc non Af Amer: 59 mL/min — ABNORMAL LOW (ref >60)
Glucose, Bld: 92 mg/dL (ref 65–99)
POTASSIUM: 3.6 mmol/L (ref 3.5–5.1)
Sodium: 129 mmol/L — ABNORMAL LOW (ref 135–145)

## 2015-12-16 LAB — I-STAT VENOUS BLOOD GAS, ED
Acid-Base Excess: 3 mmol/L — ABNORMAL HIGH (ref 0.0–2.0)
BICARBONATE: 30.8 mmol/L — AB (ref 20.0–28.0)
O2 Saturation: 67 %
PCO2 VEN: 60.2 mmHg — AB (ref 44.0–60.0)
PH VEN: 7.316 (ref 7.250–7.430)
PO2 VEN: 39 mmHg (ref 32.0–45.0)
TCO2: 33 mmol/L (ref 0–100)

## 2015-12-16 LAB — I-STAT TROPONIN, ED: Troponin i, poc: 0 ng/mL (ref 0.00–0.08)

## 2015-12-16 LAB — BRAIN NATRIURETIC PEPTIDE: B NATRIURETIC PEPTIDE 5: 57.6 pg/mL (ref 0.0–100.0)

## 2015-12-16 MED ORDER — IOPAMIDOL (ISOVUE-370) INJECTION 76%
INTRAVENOUS | Status: AC
  Administered 2015-12-16: 60 mL
  Filled 2015-12-16: qty 100

## 2015-12-16 MED ORDER — ONDANSETRON HCL 4 MG/2ML IJ SOLN
4.0000 mg | Freq: Four times a day (QID) | INTRAMUSCULAR | Status: DC | PRN
  Administered 2015-12-16: 4 mg via INTRAVENOUS
  Filled 2015-12-16: qty 2

## 2015-12-16 MED ORDER — ALBUTEROL SULFATE (2.5 MG/3ML) 0.083% IN NEBU
5.0000 mg | INHALATION_SOLUTION | Freq: Once | RESPIRATORY_TRACT | Status: AC
  Administered 2015-12-16: 5 mg via RESPIRATORY_TRACT
  Filled 2015-12-16: qty 6

## 2015-12-16 MED ORDER — IPRATROPIUM BROMIDE 0.02 % IN SOLN
0.5000 mg | Freq: Once | RESPIRATORY_TRACT | Status: AC
  Administered 2015-12-16: 0.5 mg via RESPIRATORY_TRACT
  Filled 2015-12-16: qty 2.5

## 2015-12-16 MED ORDER — ONDANSETRON HCL 4 MG/2ML IJ SOLN
INTRAMUSCULAR | Status: AC
  Filled 2015-12-16: qty 2

## 2015-12-16 MED ORDER — ALBUTEROL SULFATE (2.5 MG/3ML) 0.083% IN NEBU
2.5000 mg | INHALATION_SOLUTION | RESPIRATORY_TRACT | Status: DC | PRN
  Administered 2015-12-20: 2.5 mg via RESPIRATORY_TRACT
  Filled 2015-12-16: qty 3

## 2015-12-16 MED ORDER — IPRATROPIUM-ALBUTEROL 0.5-2.5 (3) MG/3ML IN SOLN
3.0000 mL | Freq: Once | RESPIRATORY_TRACT | Status: AC
  Administered 2015-12-16: 3 mL via RESPIRATORY_TRACT
  Filled 2015-12-16: qty 3

## 2015-12-16 MED ORDER — SODIUM CHLORIDE 0.9 % IV BOLUS (SEPSIS): 1000.0000 mL | Freq: Once | INTRAVENOUS | Status: DC

## 2015-12-16 MED ORDER — SODIUM CHLORIDE 0.9 % IV BOLUS (SEPSIS)
1000.0000 mL | Freq: Once | INTRAVENOUS | Status: AC
  Administered 2015-12-16: 1000 mL via INTRAVENOUS

## 2015-12-16 MED ORDER — METHOCARBAMOL 500 MG PO TABS: 500.0000 mg | ORAL_TABLET | Freq: Three times a day (TID) | ORAL | 0 refills | Status: AC | PRN

## 2015-12-16 NOTE — Assessment & Plan Note (Signed)
Atypical chest pain ? Etiology  Will need further evaluation as this seems to getting worse with associated dyspnea .  Case discussed with Dr. Melvyn Novas    Plan  Refer to ER via EMS transport.

## 2015-12-16 NOTE — Discharge Instructions (Signed)
Increase your oxygen to 4 liters or as you need it. Dr. Gustavus Bryant office will call you with an appointment. If you do not hear anything by midday. Return if you have worsening shortness of breath or any new symptoms.

## 2015-12-16 NOTE — Telephone Encounter (Signed)
Spoke with pt's daughter. Pt is having lots of issues with her breathing. They have an appointment today at 10:15am with TP. Vear Clock to keep this appointment and if TP sees necessary, we can send the pt to hospital from here. Olivia [name] agreed and verbalized understanding. Nothing further was needed at this time.

## 2015-12-16 NOTE — Consult Note (Signed)
Consult Note:   Olivia [name] [id number] DOB: 04-20-1931 DOA: 12/16/2015 PCP: Binnie Rail, MD  Requesting provider: Margarita Mail, PA-C   Reason for consultation: Evaluate for admission due to shortness of breath    History of Present Illness:   Olivia [name] is an 83 y.o. female with a PMH of Tobacco abuse, Chronic respiratory failure on 2 L of home oxygen secondary to COPD GOLD III , under the care of Dr. Melvyn Novas, last seen by him on 12/13/15. Office notes reviewed. Spirometry done 02/04/12 with an FEV1 of 1.01 (48% of predicted). She quit smoking in 2009. She has been getting progressively more short of breath for several months. Saw her PCP, Dr. Royal Piedra, earlier today. Office records reviewed. Her oxygen saturations were noted to be 92% on 2 L and she was referred to the emergency department for further evaluation.Patient does indeed endorse persistent shortness of breath that is only made better by sitting upright and leaning forward. Shortness of breath is associated with pressure in the chest as if someone is squeezing her air out. Chest radiography showed linear bibasilar atelectasis or scarring with possible small effusions. CT angiogram was negative for pulmonary embolism but did show mediastinal adenopathy concerning for possible metastatic disease or malignancy. When I interviewed the patient, she adamantly states that she does not want to come into the hospital and prefers to have her evaluation done as an outpatient. Since her symptoms have been present since July, I think this is reasonable. The patient feels she will be more comfortable at home.   Review of Systems:   Review of Systems  Constitutional: Negative for chills and fever.  HENT: Positive for congestion.        Hoarseness  Eyes: Negative.   Respiratory: Positive for cough, sputum production, shortness of breath and wheezing.   Cardiovascular:       Chest tightness  Gastrointestinal: Negative.     Genitourinary: Negative.   Musculoskeletal: Negative.   Skin: Negative.   Neurological: Negative.   Endo/Heme/Allergies: Negative.   Psychiatric/Behavioral: Negative.     Allergies:     Allergies  Allergen Reactions  . Diclofenac     Hives  . Acetaminophen     Elevated LFTs  . Sulfa Antibiotics Nausea And Vomiting  . Ultram [Tramadol]     vomiting  . Fluvastatin Sodium     Lescol caused flu symptoms  . Meperidine Hcl     vomiting    Past Medical History:     Past Medical History:  Diagnosis Date  . Anxiety   . Cancer ([place])    squamous cell rectal area  . COPD (chronic obstructive pulmonary disease) ([place])   . GERD (gastroesophageal reflux disease)    occ  . Heart murmur   . History of recurrent UTIs   . Hyperlipidemia   . Hypertension   . Hypothyroidism   . Left lumbar radiculopathy   . Myopathy   . Osteoarthritis   . Pneumonia 1/09   hx  . Shortness of breath     Past Surgical History:   Past Surgical History:  Procedure Laterality Date  . CATARACT EXTRACTION, BILATERAL    . colonoscopy with polypectomy    . DILATION AND CURETTAGE OF UTERUS    . GLAUCOMA SURGERY    . MICROLARYNGOSCOPY Right 04/26/2012   Procedure: MICROLARYNGOSCOPY WITH EXCISION OF VOCAL CORD LESION;  Surgeon: Izora Gala, MD;  Location: [place];  Service: ENT;  Laterality: Right;  .  squamous cell cancer  resection     rectal; Dr Morton Stall , [facility]  . uterine ablation     Dr Ubaldo Glassing    Current Medications:   Scheduled Meds: Continuous Infusions: PRN Meds:.  Social History:    reports that she quit smoking about 8 years ago. Her smoking use included Cigarettes. She has a 57.00 pack-year smoking history. She has never used smokeless tobacco. She reports that she drinks alcohol. She reports that she does not use drugs.  Family History:   Family History  Problem Relation Age of Onset  . Arthritis Mother   . Diabetes Mother   . Heart disease Mother     MI @ 34  . Kidney disease  Father   . Heart disease Father     Rheumatic Heart Disease  . Asthma Sister   . Cancer Brother     brother breast cancer, melanoma  . Stroke Maternal Grandfather     ? age  . Stroke Paternal Grandfather 33     Physical Exam:    Vitals:   12/16/15 1315 12/16/15 1330 12/16/15 1345 12/16/15 1706  BP: 137/71 121/87 139/73 128/70  Pulse: (!) 46 60 (!) 50 65  Resp: 19 18 18 23   SpO2: 98% 96% 95% 94%   General: Alert, awake, oriented x3, with increased work of breathing. HEENT: No bruits, no goiter. Heart: Regular rate and rhythm, without murmurs, rubs, gallops. Lungs: Clear to auscultation bilaterally, tachypneic, no wheeze appreciated. Abdomen: Soft, nontender, nondistended, positive bowel sounds. Extremities: No clubbing cyanosis or edema with positive pedal pulses. Neuro: Grossly intact, nonfocal. Psych: Mood and affect normal.  Data Review:   Labs:  Basic Metabolic Panel:  Recent Labs Lab 12/16/15 1320  NA 129*  K 3.6  CL 93*  CO2 29  GLUCOSE 92  BUN 16  CREATININE 0.89  CALCIUM 9.0   GFR Estimated Creatinine Clearance: 42.1 mL/min (by C-G formula based on SCr of 0.89 mg/dL). Liver Function Tests: No results for input(s): AST, ALT, ALKPHOS, BILITOT, PROT, ALBUMIN in the last 168 hours. No results for input(s): LIPASE, AMYLASE in the last 168 hours. No results for input(s): AMMONIA in the last 168 hours. Coagulation profile No results for input(s): INR, PROTIME in the last 168 hours.  CBC:  Recent Labs Lab 12/16/15 1320  WBC 3.6*  HGB 12.3  HCT 36.1  MCV 91.4  PLT 145*   Cardiac Enzymes: No results for input(s): CKTOTAL, CKMB, CKMBINDEX, TROPONINI in the last 168 hours. BNP: Invalid input(s): POCBNP CBG: No results for input(s): GLUCAP in the last 168 hours. D-Dimer No results for input(s): DDIMER in the last 72 hours. Hgb A1c No results for input(s): HGBA1C in the last 72 hours. Lipid Profile No results for input(s): CHOL, HDL, LDLCALC,  TRIG, CHOLHDL, LDLDIRECT in the last 72 hours. Thyroid function studies No results for input(s): TSH, T4TOTAL, T3FREE, THYROIDAB in the last 72 hours.  Invalid input(s): FREET3 Anemia work up No results for input(s): VITAMINB12, FOLATE, FERRITIN, TIBC, IRON, RETICCTPCT in the last 72 hours. Urinalysis    Component Value Date/Time   COLORURINE YELLOW 09/13/2013 1729   APPEARANCEUR CLEAR 09/13/2013 1729   LABSPEC 1.025 09/13/2013 1729   PHURINE 6.0 09/13/2013 1729   GLUCOSEU NEGATIVE 09/13/2013 1729   HGBUR NEGATIVE 09/13/2013 1729   HGBUR small 04/15/2010 1534   BILIRUBINUR SMALL (A) 09/13/2013 1729   KETONESUR TRACE (A) 09/13/2013 1729   UROBILINOGEN 1.0 09/13/2013 1729   NITRITE NEGATIVE 09/13/2013 1729   LEUKOCYTESUR  NEGATIVE 09/13/2013 1729     Sepsis Labs Invalid input(s): PROCALCITONIN,  WBC,  LACTICIDVEN Microbiology No results found for this or any previous visit (from the past 240 hour(s)).    Radiological Exams: Ct Angio Chest Pe W And/or Wo Contrast  Result Date: 12/16/2015 CLINICAL DATA:  Shortness of breath.  Chest pain. EXAM: CT ANGIOGRAPHY CHEST WITH CONTRAST TECHNIQUE: Multidetector CT imaging of the chest was performed using the standard protocol during bolus administration of intravenous contrast. Multiplanar CT image reconstructions and MIPs were obtained to evaluate the vascular anatomy. CONTRAST:  60 mL of Isovue 370 intravenously. COMPARISON:  Radiographs of December 16, 2015. CT scan of July 29, 2007. FINDINGS: Cardiovascular: Atherosclerosis of thoracic aorta is noted without aneurysm. There is no evidence of pulmonary embolus. Coronary artery calcifications are noted. Mediastinum/Nodes: 3.5 x 2.8 cm sub carinal mass is noted. 18 mm precarinal lymph node is noted. Lungs/Pleura: No pneumothorax or significant pleural effusion is noted. Mild bibasilar subsegmental atelectasis is noted. Mild emphysematous disease is noted in the upper lobes bilaterally. Upper  Abdomen: Visualized portion of upper abdomen is unremarkable. Musculoskeletal: No significant osseous abnormality is noted. Review of the MIP images confirms the above findings. IMPRESSION: Aortic atherosclerosis. Coronary artery calcifications are noted suggesting coronary artery disease. No definite evidence of pulmonary embolus. Mediastinal adenopathy is noted concerning for possible metastatic disease or malignancy. Clinical correlation is recommended. Mild emphysematous disease is noted in the upper lobes bilaterally. Electronically Signed   By: Marijo Conception, M.D.   On: 12/16/2015 15:46   Dg Chest Portable 1 View  Result Date: 12/16/2015 CLINICAL DATA:  Shortness of breath, chest pain for 1 week. EXAM: PORTABLE CHEST 1 VIEW COMPARISON:  12/09/2015 FINDINGS: Cardiomegaly. There is hyperinflation of the lungs compatible with COPD. Linear scarring or atelectasis in the lung bases. Suspect small effusions. No acute bony abnormality. IMPRESSION: Linear bibasilar atelectasis or scarring.  Suspect small effusions. Mild cardiomegaly.  COPD. Electronically Signed   By: Rolm Baptise M.D.   On: 12/16/2015 14:02    Assessment and Plan:   Active Problems:   Mediastinal mass in a patient with a PMH of tobacco abuse and COPD The patient prefers to have this worked up as an outpatient. I've contacted the pulmonologist on call, Dr. Ashby Dawes, who has sent a message to the scheduler to arrange for follow-up this week. She will likely need bronchoscopy/biopsy. In the meantime, can discharge home on 4 L of oxygen and her typical home regimen. Okay to use Robaxin as needed. I have cautioned the family to avoid oversedation.   Thank you for this consultation.  We will follow the patient with you.  Time Spent on Consult: 1 hour  RAMA,CHRISTINA 12/16/2015, 5:54 PM

## 2015-12-16 NOTE — ED Notes (Signed)
Rechecked pt's BP manually and got it at 90/58

## 2015-12-16 NOTE — ED Notes (Signed)
HR running low. Looked back at previous visits and HR normally runs low.

## 2015-12-16 NOTE — Progress Notes (Signed)
Subjective:  Patient ID: Olivia [name], female    DOB: 10/04/1931     MRN: [id number]   Brief patient profile: 79 yowf quit smoking 2009 previously followed in pulmonary clinic by [name] with  Dx copd with Spirometry  02/04/2012  C/w GOLD III criteria though f/v loop not typical.    History of Present Illness  07/03/2014 Dr [name] final note  Chief Complaint  Patient presents with  . Follow-up    Pt c/o hoarness started after taking incruise, SOB, dry cough x 1 month.   Pt hoarse with incruse dpi.  Pt remains dyspnea on exertion.    rec Prevnar 13 was given Stay on Incruse and Advair  Use albuterol as needed    02/05/2015  Extended transition of care f/u ov/Wert re: establish care for copd Chief Complaint  Patient presents with  . Follow-up    Former pt of [name]. Pt still c/o hoarseness- relates to taking incruse. She has had increased cough for the past few days- non prod.  She is having some increased SOB today, and had to use ventolin for the first time in a month,   chronic  Doe x really winded when returns from [place] to house  ok at rest/ main concern is chronic hoarseness and variable severe dry cough day >>noct rec Plan A = Stiolto 2 puffs each am Plan B - Only use your albuterol (ventolin) as a rescue medication  Plan C = crisis - Albuterol neb up to every 4 hours if you try your ventolin first and it doesn't work   02/27/2015  f/u ov/Wert re: GOLD III with atypical f/v and upper airway component  Chief Complaint  Patient presents with  . Follow-up    Pt states she did well on stiolto- was coughing less and not as hoarse. She ran out of med after 2 wks and now symptoms are coming back, but are less severe.    doe = MMRC2 = can't walk a nl pace on a flat grade s sob/ rncrease need for saba p changed back to incruse rec Plan A =  Automatic Stiolto 2 puffs each am   (if they won't cover it the other option is BEVESPI up to 2 puffs every 12 hours and the other  option is nebulized formoterol/pulmicort thru part B medicare)    Plan B = Backup  - Only use your albuterol (ventolin) as a rescue medication Plan C = crisis - Albuterol neb up to every 4 hours if you try your ventolin first and it doesn't work    05/28/2015  f/u ov/Wert re: GOLD III / maint rx bevespi though hfa not ideal  Chief Complaint  Patient presents with  . Follow-up    Cough has improved some, but still throat clearing often.  Her hoarseness has resolved. She has only used rescue inhaler x 1 since the last visit and has not needed neb.   still doe = MMRC2 = can't walk a nl pace on a flat grade s sob but does fine slow and flat eg [place] or [place] or parking lot, but overall better tol   still urge to clear throat day >> noct or early am rec Work on inhaler technique:   Try prilosec(omeprazole) otc 20mg   Take 30-60 min before first meal of the day and tagamet (cimetidine) 400  Mg @  bedtime until cough is completely gone for at least a week without the need for cough suppression GERD  Continue Bivespi Take 2 puffs first thing in am and then another 2 puffs about 12 hours later.     09/17/2015 extended  f/u ov/Wert re:  GOLD III no better on bevespi Chief Complaint  Patient presents with  . Acute Visit    Pt c/o increased SOB for the past 2 wks. She is using rescue inhaler 2 x daily on average and feels that this is no longer helping.  She also c/o increased cough which she relates to taking Bevespi. Cough is non prod.   states hasn't been to [place] or [facility] in many years (note hx changed)  Most days goes to [place] and back and piddles along the way x ? How many years since you've been able to walk there and back s stopping:  A Never  answered occ at hs sob and takes ventolin and it doesn't work  Lots of upper airway tickling ? Taking gerd rx correctly/ unable to confirm  rec Plan A =  Automatically Performist and budesonide twice daily  Plan B =  Backup  - Only use your albuterol (ventolin)    Plan C = crisis - Albuterol neb up to every 4 hours if you try your ventolin first and it doesn't work     10/08/2015  Extended acute ov/Wert re: GOLD III  On perf/bud and multiple saba / very disorganized with meds Chief Complaint  Patient presents with  . Acute Visit    pt c/o worsening SOB, intermittent chest pains present X1 month.    no longer needing ventolin in middle of night  Neb twice daily  Can't use spiriva powder due to airway issue  Waking up at night hungry and eating meals "all day and night"  Doe = MMRC4  = sob if tries to leave home or while getting dressed  - sob room to room  >>buedsione and perforomist  , started Spiriva spray   11/10/2015  NP Follow up : COPD GOLD III   Does not like the nebs, makes mouth sore . She has been on budesoinde and perforomist . Last visit started on Spiriva . Felt it might have helped with breathing some but Insurance will not cover Spiriva.  Wants to try [facility]. Insurance will cover this.  Chest xray in July with chronic change . Echo in June with preserved EF .  rec Stop Budesonide and Perforomist nebs  Stop Spiriva  Begin ANORO 1 puff daily , rinse after use > could not tolerate Use Albuterol inhaler or neb every 4h as needed. -this is your emergency medicine.    Multiple phone calls re inability to tol or pay for meds > changed to duoneb qid    11/21/2015 acute extended ov/Wert re:  GOLD III thoroughly confused with meds, not on gerd rx any longer "don't have HB" Chief Complaint  Patient presents with  . Acute Visit    Pt states her breathing has been worse since last visit. She states unable to get dressed or go check the mailbox b/c she gets too SOB. She is using ventolin and albuterol nebs at least 2 x per day.  totally confused with maint vs prns and daughter not able to observe/ help with daily rx  Sob across the room now and wants 02 rec Plan A = Automatic = ipatropium /albuterol nebulizer first thing in am and again  about every 4-6 hours whether you think you need it or not  Pantoprazole (protonix) 40 mg   Take  30-60 min before first meal of the day (when you take your thyroxin) and Pepcid (famotidine)  20 mg one @  bedtime until return to office - this is the best way to tell whether stomach acid is contributing to your problem.   Plan B = Backup Only use your albuterol (ventolin/proair) as a rescue medication Plan C = Crisis - only use your albuterol nebulizer up to every 2 hours if you first try Plan B and it fails to help > ok to use the nebulizer up to every 4 hours but if start needing it regularly call for immediate appointment  Prednisone 10 mg take  4 each am x 2 days,   2 each am x 2 days,  1 each am x 2 days and stop       12/09/2015 acute extended ov/Wert re:  GOLD III / worse sob  Chief Complaint  Patient presents with  . Acute Visit    Pt states " I feel addicted to albuterol"- feels like she is suffocating unless she uses the neb every 6 hours.  She states that she is "panting" until it is time to use her albuterol. She is not really using the ventolin inhaler b/c she does not feel she is strong enough.     confused again between maint and prn meds / not able to keep appt for med reconciliation  >>labs  And xray   12/16/2015 Acute OV : GOLD III COPD/ Chest pain  Pt presents for an acute office visit. Complains over last few months breathing has been getting worse . She is accompanied by her daughter that says since mid July her breathing has been going downhill.  She complains over last couple of weeks of mid chest pain/pressure with squeezing in her chest . Pain comes and goes . Had severe episode last night but nothing this am. Has to sleep sitting up bed .  Labs done last week with nml BNP, and CBC. CXR x 3 over last 2 months have shown chronic COPD changes. She had echo in June this year with preserved EF , gr 1 DD . Stress myoview in 2014 results showed nml myoview, similar to 2003.    She has been seen several times with similar complaints over last few months, has had medication changes from nebs to inhalers , steroid taper, trigger control with GERD tx.  She says nothing has helped. Daughter says she is very worried about this squeezing pain in her chest.  She denies any jaw pain, nausea, vomiting, diarrhea, hemoptysis, fever or discolored mucus She does have "crick in her neck " , recently started on robaxin.    Current Medications, Allergies, Complete Past Medical History, Past Surgical History, Family History, and Social History were reviewed in [employer] record.  ROS  Constitutional:   No  weight loss, night sweats,  Fevers, chills, + fatigue, or  lassitude.  HEENT:   No headaches,  Difficulty swallowing,  Tooth/dental problems, or  Sore throat,                No sneezing, itching, ear ache, nasal congestion, post nasal drip,   CV:  No  PND, swelling in lower extremities, anasarca, dizziness, palpitations, syncope.   GI  No heartburn, indigestion, abdominal pain, nausea, vomiting, diarrhea, change in bowel habits, loss of appetite, bloody stools.   Resp:  No chest wall deformity  Skin: no rash or lesions.  GU: no dysuria, change in color of  urine, no urgency or frequency.  No flank pain, no hematuria   MS:  No joint pain or swelling.  No decreased range of motion.  No back pain.  Psych:  No change in mood or affect. No depression or anxiety.  No memory loss.                      Objective:   Physical Exam   amb anxious/ hoarse wf in wc   Vitals:   12/16/15 1120  BP: 106/70  Pulse: (!) 53  Temp: 97.4 F (36.3 C)  TempSrc: Oral  SpO2: 92%  Weight: 143 lb 3.2 oz (65 kg)  Height: 5\' 1"  (1.549 m)     10/08/15 Vital signs reviewed - note Sats 92% on 2l/m   HEENT: nl dentition, turbinates, and oropharynx with minimal thrush. Nl external ear canals without cough reflex   NECK :  without JVD/Nodes/TM/ nl carotid  upstrokes bilaterally   LUNGS: no acc muscle use,  Minimal barrel  contour kyphotic chest with BB crackles    CV:  RRR  no s3 or murmur or increase in P2, no edema   ABD:  soft and nontender with nl inspiratory excursion in the supine position. No bruits or organomegaly, bowel sounds nl  MS:   ext warm without deformities, calf tenderness, cyanosis or clubbing  SKIN: warm and dry without lesions    NEURO:  alert, no focal deficits    CXR PA and Lateral:   12/09/2015 :      Stable bibasilar linear scarring or atelectasis. No definite active process. Hyperaeration suggesting emphysema.    Labs ordered/ reviewed:      Chemistry      Component Value Date/Time   NA 141 12/09/2015 1735   NA 142 12/09/2015 1735   K 4.0 12/09/2015 1735   K 4.0 12/09/2015 1735   CL 102 12/09/2015 1735   CL 103 12/09/2015 1735   CO2 31 12/09/2015 1735   CO2 27 12/09/2015 1735   BUN 24 (H) 12/09/2015 1735   BUN 25 (H) 12/09/2015 1735   CREATININE 1.39 (H) 12/09/2015 1735   CREATININE 1.39 (H) 12/09/2015 1735      Component Value Date/Time   CALCIUM 9.3 12/09/2015 1735   CALCIUM 9.3 12/09/2015 1735   ALKPHOS 84 12/09/2015 1735   AST 36 12/09/2015 1735   ALT 28 12/09/2015 1735   BILITOT 0.4 12/09/2015 1735        Lab Results  Component Value Date   WBC 5.1 12/09/2015   HGB 12.6 12/09/2015   HCT 36.8 12/09/2015   MCV 92.7 12/09/2015   PLT 155.0 12/09/2015       Lab Results  Component Value Date   TSH 0.87 12/09/2015     Lab Results  Component Value Date   PROBNP 31.0 12/09/2015      Nahima Ales NP-C  [facility]  12/16/2015

## 2015-12-16 NOTE — ED Notes (Addendum)
Patients family states patient is having trouble breathing. Patient just came back from CT and not on monitor. Hooked patient up, o2 saturation 86% on 2L. Assisted patient in tripod position and turned up 02 to 4 L. o2 sat came up to 92%. EDP notified.

## 2015-12-16 NOTE — Patient Instructions (Signed)
GO to ER for evaluation

## 2015-12-16 NOTE — Telephone Encounter (Signed)
lmtcb x1 for pt's daughter, Eustaquio Maize.

## 2015-12-16 NOTE — ED Triage Notes (Addendum)
Pt from [facility] via [facility] with request for rule out cardiac for generalized squeezing chest pain for the last week.  Pt reports pain decreases when she leans forward.  Pt additionally reports her SOB gets better when she leans forward as well.  Denies all other symptoms, no change in SOB from chronic. Started on home O2 this past Thursday and reports a decrease in her complaints at that time but complaints have since returned. Pt denies pain at this time, had pain this morning, took both robaxin and ventolin this morning around 930 am.  NAD, A&O.

## 2015-12-16 NOTE — ED Provider Notes (Signed)
Hand-off from [place], [place].  See initial provider's note for full HPI.  Briefly, pt is a 82 yo female with PMH of COPD who presents to the ED with complaint of SOB and chest tightness. Pt reports her sxs have been worsening since July this year and note she has had a new oxygen requirement over the past few weeks.   Chart review shows pt was seen by her pulmonologist earlier today for worsening SOB. Pt with GOLD III Severe COPD. O2 sat 92% on 2L. Pt referred to ER for further evaluation.   Pulmolgoist - Dr. Melvyn Novas PCP - Dr. Royal Piedra  Physical Exam  BP 118/81 (BP Location: Right Arm)   Pulse 71   Resp 19   SpO2 99%   Physical Exam  Constitutional: She is oriented to person, place, and time. She appears well-developed and well-nourished.  HENT:  Head: Normocephalic and atraumatic.  Eyes: Conjunctivae and EOM are normal. Right eye exhibits no discharge. Left eye exhibits no discharge. No scleral icterus.  Neck: Normal range of motion. Neck supple.  Cardiovascular: Normal rate, regular rhythm, normal heart sounds and intact distal pulses.   Pulmonary/Chest: Effort normal. She has no wheezes. She has no rales. She exhibits no tenderness.  Pt leaning over table on side of bed with mild increased work of breathing noted.   Abdominal: Soft. Bowel sounds are normal. She exhibits no distension. There is no tenderness. There is no guarding.  Musculoskeletal: Normal range of motion. She exhibits no edema.  Neurological: She is alert and oriented to person, place, and time.  Skin: Skin is warm and dry.  Nursing note and vitals reviewed.   ED Course  Procedures  MDM Pt presents with worsening SOB over the past few months with new oxygen requirement over the past few months. Hx of COPD. Pt was initially seen at pulmonology clinic this afternoon but was sent to the ED for further evaluation due to worsening SOB. BNP 57.6. CXR showed linear bibasilar atelectasis/scarring with possible small  effusions. CT angio chest negative for PE but showed mediastinal adenopathy concerning for metastatic disease vs. Malignancy. Plan was to initially admit pt due to increased oxygen saturation and worsening SOB with new image findings. After pt was initially accepted by Dr. Rockne Menghini, pt and family adamantly refused admission. On repeat vitals prior to d/c pt was hypotensive with positive orthostatics. Pt was given 1L IVF. Plan to reevaluate pt s/p IVF prior to d/c.   On my initial evaluation pt is sitting on side of bed with mild increased work of breathing. BP 91/59 s/p IVF; remaining vitals stable. During my evaluation both the pt and daughter at bedside report that they changed their mind and would like her to be admitted to due her increased SOB and concern for caring for her at home. Consulted hospitalist. Dr. Loleta Books agrees to admission.       Chesley Noon V[place], [place] 12/16/15 2152    Duffy Bruce, MD 12/17/15 [id number]    Duffy Bruce, MD 12/17/15 [phone]

## 2015-12-16 NOTE — ED Notes (Signed)
Patient transported to CT 

## 2015-12-16 NOTE — Assessment & Plan Note (Signed)
GOLD III Severe COPD with significant symptom burden despite Oxygen and maximum medications  Will need to refer to ER today for further evaluation  Consider CT chest -HRCT to evaluate for possible ILD change .  Kidney fxn excludes contrast. If sx persist, consider d dimer , VQ scan.

## 2015-12-16 NOTE — ED Notes (Signed)
Hospitalist at bedside. Patient in tripod position and having nausea. See orders. MD aware. Patient states she has been feeling nauseous all day but wanted to go home.

## 2015-12-16 NOTE — H&P (Signed)
History and Physical  Patient Name: Olivia [name]     [id number]    DOB: 12-Nov-1931    DOA: 12/16/2015 PCP: Binnie Rail, MD   Patient coming from: Home  Chief Complaint: Dyspnea and chest tightness  HPI: Olivia [name] is a 82 y.o. female with a past medical history significant for COPD GOLD III, FEV1 47%, hypothyroidism, anxiety, and HTN who presents with worsening dyspnea over months and now chest pain.   Most history is collected from the patient's daughter, as the patient appears very uncomfortable on my exam and can only confirm daughter's history.  Evidently, the patient has had chronic dyspnea on exertion for long distances, but woke on or around July 16 with new worsening of her shortness of breath that was noticeable even when walking around her room (daughter can pinpoint the date, almost, and this is corroborated with Pulm notes).  During the next several months, she had to limit her daily activities (couldn't walk around much), was using her albuterol 2-3 times per day.   -Some adjustments were made to her inhalers, tweaks to her GERD medicines --> no improvement. -8/16 and 9/29 twice she did laps ~200 ft without desats in the office -9/29 did a 6-day prednisone taper without improvement, only worsening and needing albuterol every few hours -10/17 O2 sat while ambulating down to 84%, started on home O2   On 10/24 she woke up complaining of her daughter of "chest tightness" that was constant, associated with nausea, and further worsening of her shortness of breath.  Her family arranged for her to be seen in [facility] clinic, and she was referred from there to the ER.  ED course: -Afebrile, heart rates 46-50s, respirations normal, pulse ox normal on 2L [place], BP fluctuated [id number] systolic -Na Q000111Q, K 3.6, Cr 0.9 (baseline 1.4), WBC 3.6K, Hgb 12.3, troponin negative, BNP normal -CXR unremarkable, CTA chest showed a new 2cm mediastinal mass and suspicious lymphadenopathy, as well as  emphysema, some mild atelectasis, but no effusions, pneumonia, or PE -ECG showed old bradycardia only -Initially, arrangements were made to discharge the patient with outpatient Pulm follow up for bronch and biopsy of her new mediastinal mass but subsequently the patient began to complain of severe shortness of breath, unable to sit still, chest tightness and so [facility] were called again to evaluate the patient for admission  She complained of nausea, severe shortness of breath, only relieved by sitting straight up or somewhat forward, dry mouth "so dry I think I'm dying".      ROS: Review of Systems  Constitutional: Positive for weight loss (10-15 lbs in last year). Negative for chills and fever.  Respiratory: Positive for shortness of breath. Negative for cough, hemoptysis, sputum production and wheezing.   Cardiovascular: Positive for chest pain and orthopnea (very positional SOB). Negative for leg swelling.  All other systems reviewed and are negative.         Past Medical History:  Diagnosis Date  . Anxiety   . Cancer ([place])    squamous cell rectal area  . COPD (chronic obstructive pulmonary disease) ([place])   . GERD (gastroesophageal reflux disease)    occ  . Heart murmur   . History of recurrent UTIs   . Hyperlipidemia   . Hypertension   . Hypothyroidism   . Left lumbar radiculopathy   . Myopathy   . Osteoarthritis   . Pneumonia 1/09   hx  . Shortness of breath     Past Surgical  History:  Procedure Laterality Date  . CATARACT EXTRACTION, BILATERAL    . colonoscopy with polypectomy    . DILATION AND CURETTAGE OF UTERUS    . GLAUCOMA SURGERY    . MICROLARYNGOSCOPY Right 04/26/2012   Procedure: MICROLARYNGOSCOPY WITH EXCISION OF VOCAL CORD LESION;  Surgeon: Izora Gala, MD;  Location: [facility];  Service: ENT;  Laterality: Right;  . squamous cell cancer  resection     rectal; Dr Morton Stall , [facility]  . uterine ablation     Dr Ubaldo Glassing    Social History: Patient lives by herself.   The patient walks unassisted.  She is a former smoker, quit several years ago.  Hasn't been able to drive for years and somewhat sedentary, but able to ambulate around usually until this July.    Allergies  Allergen Reactions  . Diclofenac     Hives  . Acetaminophen     Elevated LFTs  . Sulfa Antibiotics Nausea And Vomiting  . Ultram [Tramadol]     vomiting  . Fluvastatin Sodium     Lescol caused flu symptoms  . Meperidine Hcl     vomiting    Family history: family history includes Arthritis in her mother; Asthma in her sister; Cancer in her brother; Diabetes in her mother; Heart disease in her father and mother; Kidney disease in her father; Stroke in her maternal grandfather; Stroke (age of onset: 9) in her paternal grandfather.  Prior to Admission medications   Medication Sig Start Date End Date Taking? Authorizing Provider  albuterol (PROVENTIL) (2.5 MG/3ML) 0.083% nebulizer solution Take 3 mLs (2.5 mg total) by nebulization every 6 (six) hours as needed for wheezing or shortness of breath. 12/04/15  Yes Tanda Rockers, MD  aspirin 81 MG tablet Take 81 mg by mouth daily. Reported on 05/28/2015   Yes Historical Provider, MD  chlorthalidone (HYGROTON) 25 MG tablet TAKE ONE-HALF TABLET BY MOUTH ONCE DAILY 08/06/15  Yes Binnie Rail, MD  cimetidine (TAGAMET) 200 MG tablet Take 200 mg by mouth daily.   Yes Historical Provider, MD  docusate sodium (COLACE) 100 MG capsule Take 100 mg by mouth daily as needed.    Yes Historical Provider, MD  FIBER PO Take 1 tablet by mouth daily as needed.    Yes Historical Provider, MD  Fluticasone-Salmeterol (ADVAIR DISKUS) 250-50 MCG/DOSE AEPB One puff twice daily 12/09/15  Yes Tanda Rockers, MD  hydroxypropyl methylcellulose (ISOPTO TEARS) 2.5 % ophthalmic solution Place 2 drops into both eyes 4 (four) times daily as needed (for dry eyes).   Yes Historical Provider, MD  levothyroxine (SYNTHROID, LEVOTHROID) 50 MCG tablet 1/2 tablet daily 11/21/15  Yes  Tanda Rockers, MD  magic mouthwash SOLN Take 5 mLs by mouth.   Yes Historical Provider, MD  metoprolol succinate (TOPROL-XL) 25 MG 24 hr tablet 1/2 tab by mouth daily 07/11/15  Yes Binnie Rail, MD  pantoprazole (PROTONIX) 40 MG tablet Take 1 tablet (40 mg total) by mouth daily. Take 30-60 min before first meal of the day 09/17/15  Yes Tanda Rockers, MD  PARoxetine (PAXIL) 20 MG tablet TAKE ONE TABLET BY MOUTH ONCE DAILY 12/09/15  Yes Binnie Rail, MD  umeclidinium bromide (INCRUSE ELLIPTA) 62.5 MCG/INH AEPB Inhale 1 puff into the lungs daily. 12/09/15  Yes Tanda Rockers, MD  VENTOLIN HFA 108 (90 Base) MCG/ACT inhaler INHALE TWO PUFFS BY MOUTH EVERY 6 HOURS AS NEEDED FOR WHEEZING FOR SHORTNESS OF BREATH 11/27/15  Yes Tanda Rockers,  MD  famotidine (PEPCID) 20 MG tablet One at bedtime Patient not taking: Reported on 12/16/2015 12/09/15   Tanda Rockers, MD  methocarbamol (ROBAXIN) 500 MG tablet Take 1 tablet (500 mg total) by mouth 3 (three) times daily as needed for muscle spasms. 12/16/15   Margarita Mail, PA-C  nystatin (MYCOSTATIN) 100000 UNIT/ML suspension Take 10 mLs (1,000,000 Units total) by mouth 4 (four) times daily. Patient not taking: Reported on 12/16/2015 11/25/15   Tanda Rockers, MD       Physical Exam: BP (!) 99/44   Pulse 80   Resp 19   SpO2 (!) 88%  General appearance: Elderly adult female, awake but in obvious distress from dyspnea.   Eyes: Anicteric, conjunctiva pink, lids and lashes normal. PERRL.    ENT: No nasal deformity, discharge, epistaxis.  Hearing normal. OP dry without lesions.   Neck: No neck masses.  Trachea midline.  No thyromegaly/tenderness. Lymph: No cervical or supraclavicular lymphadenopathy. Skin: Warm and dry.  No jaundice.  No suspicious rashes or lesions. Cardiac: Slow, regular, nl S1-S2, no murmurs appreciated.  No rub appreciated.  Capillary refill is brisk.  No JVD.  No LE edema.  Radial and DP pulses 2+ and symmetric. Respiratory: Shallow  respirations.  Poor inspiratory effort.  No wheezes or rales appreciated.  Poor air movement. Abdomen: Abdomen soft.  No TTP. No ascites, distension, hepatosplenomegaly.   MSK: No deformities or effusions.  No cyanosis or clubbing. Neuro: Cranial nerves grossly normal.  Sensation intact to light touch. Speech is fluent.  Muscle strength globally diminished.    Psych: Sensorium intact and responding to questions, attention distracted by dyspnea.  Affect anxious.  No evidence of hallucinations.     Labs on Admission:  I have personally reviewed following labs and imaging studies: CBC:  Recent Labs Lab 12/16/15 1320  WBC 3.6*  HGB 12.3  HCT 36.1  MCV 91.4  PLT Q000111Q*   Basic Metabolic Panel:  Recent Labs Lab 12/16/15 1320  NA 129*  K 3.6  CL 93*  CO2 29  GLUCOSE 92  BUN 16  CREATININE 0.89  CALCIUM 9.0   GFR: Estimated Creatinine Clearance: 42.1 mL/min (by C-G formula based on SCr of 0.89 mg/dL).  Liver Function Tests: No results for input(s): AST, ALT, ALKPHOS, BILITOT, PROT, ALBUMIN in the last 168 hours. No results for input(s): LIPASE, AMYLASE in the last 168 hours. No results for input(s): AMMONIA in the last 168 hours. Coagulation Profile: No results for input(s): INR, PROTIME in the last 168 hours. Cardiac Enzymes: No results for input(s): CKTOTAL, CKMB, CKMBINDEX, TROPONINI in the last 168 hours. BNP (last 3 results)  Recent Labs  09/17/15 1551 12/09/15 1735  PROBNP 119.0* 31.0   HbA1C: No results for input(s): HGBA1C in the last 72 hours. CBG: No results for input(s): GLUCAP in the last 168 hours. Lipid Profile: No results for input(s): CHOL, HDL, LDLCALC, TRIG, CHOLHDL, LDLDIRECT in the last 72 hours. Thyroid Function Tests: No results for input(s): TSH, T4TOTAL, FREET4, T3FREE, THYROIDAB in the last 72 hours. Anemia Panel: No results for input(s): VITAMINB12, FOLATE, FERRITIN, TIBC, IRON, RETICCTPCT in the last 72 hours. Sepsis Labs: Invalid  input(s): PROCALCITONIN, LACTICIDVEN No results found for this or any previous visit (from the past 240 hour(s)).       Radiological Exams on Admission: Personally reviewed: Ct Angio Chest Pe W And/or Wo Contrast  Result Date: 12/16/2015 CLINICAL DATA:  Shortness of breath.  Chest pain. EXAM: CT ANGIOGRAPHY CHEST WITH  CONTRAST TECHNIQUE: Multidetector CT imaging of the chest was performed using the standard protocol during bolus administration of intravenous contrast. Multiplanar CT image reconstructions and MIPs were obtained to evaluate the vascular anatomy. CONTRAST:  60 mL of Isovue 370 intravenously. COMPARISON:  Radiographs of December 16, 2015. CT scan of July 29, 2007. FINDINGS: Cardiovascular: Atherosclerosis of thoracic aorta is noted without aneurysm. There is no evidence of pulmonary embolus. Coronary artery calcifications are noted. Mediastinum/Nodes: 3.5 x 2.8 cm sub carinal mass is noted. 18 mm precarinal lymph node is noted. Lungs/Pleura: No pneumothorax or significant pleural effusion is noted. Mild bibasilar subsegmental atelectasis is noted. Mild emphysematous disease is noted in the upper lobes bilaterally. Upper Abdomen: Visualized portion of upper abdomen is unremarkable. Musculoskeletal: No significant osseous abnormality is noted. Review of the MIP images confirms the above findings. IMPRESSION: Aortic atherosclerosis. Coronary artery calcifications are noted suggesting coronary artery disease. No definite evidence of pulmonary embolus. Mediastinal adenopathy is noted concerning for possible metastatic disease or malignancy. Clinical correlation is recommended. Mild emphysematous disease is noted in the upper lobes bilaterally. Electronically Signed   By: Marijo Conception, M.D.   On: 12/16/2015 15:46   Dg Chest Portable 1 View  Result Date: 12/16/2015 CLINICAL DATA:  Shortness of breath, chest pain for 1 week. EXAM: PORTABLE CHEST 1 VIEW COMPARISON:  12/09/2015 FINDINGS:  Cardiomegaly. There is hyperinflation of the lungs compatible with COPD. Linear scarring or atelectasis in the lung bases. Suspect small effusions. No acute bony abnormality. IMPRESSION: Linear bibasilar atelectasis or scarring.  Suspect small effusions. Mild cardiomegaly.  COPD. Electronically Signed   By: Rolm Baptise M.D.   On: 12/16/2015 14:02    EKG: Independently reviewed. Rate 50, QTc 446, no ST or TW changes.  Spirometry Sep 2017: FEV1 47% FVC 75%  Echo June 2017: EF 0000000 Grade I diastolic dysfunction No significant valvular disease       Assessment/Plan  1. Chest discomfort:  PE has been ruled out, as has dissection.  Unclear that this is not an anginal equivalent (shortness of breath and nausea).   -Serial enzymes overnight    2. Dyspnea:  Dyspnea out of proportion to CT imaging and SpO2 and labs.  The patient has had a marked increase in dyspnea suddenly since July.  On CT tonight, she has no PE, no pleural or pericardial effusion, no pneumonia.  The mediastinal mass is not substantial enough to appear to cause respiratory distress.   Recent echocardiogram with normal EF.  Marked kyphosis, daughter unsure if this is worse recently.  -Albuterol PRN -Ondansetron PRN for nausea  -Check procalcitonin -Incentive spirometry  -Consult to Pulmonology, appreciate cares  3. Hyponatremia: This is new.  Appears euvolemic. -Check urine and serum osmolality -Check urine sodium  4. COPD with chronic respiratory failure:  FEV1 47% most recently.  On O2 since this summer. -Continue Advair, Incruse  5. HTN:  -Continue chlorthalidone, metoprolol -Continue aspirin  6. Hypothyroidism:  -Continue levothyroxine  7. Mediastinal mass:  Biopsy currently is being planned as outpatient.  8. Leukopenia and thrombocytopenia: New, mild.  Unclear etiology.  9. Other medications:  -Continue H2RA, PPI -Continue Paxil             DVT prophylaxis: Lovenox  Code  Status: FULL  Family Communication: Daughter at bedside, son by phone  Disposition Plan: Anticipate OBS overnight, symptomatic care with Duonebs, Zofran.  Serial enzymes overnight, consult to Pulmonology tomorrow. Consults called: Pulmonology overnight Admission status: OBS At the point of  initial evaluation, it is my clinical opinion that admission for OBSERVATION is reasonable and necessary because the patient's presenting complaints in the context of their chronic conditions represent sufficient risk of deterioration or significant morbidity to constitute reasonable grounds for close observation in the hospital setting, but that the patient Mccleery be medically stable for discharge from the hospital within 24 to 48 hours.    Medical decision making: Patient seen at 9:00 PM on 12/16/2015.  The patient was discussed with Harlene Ramus, PA-C.  What exists of the patient's chart was reviewed in depth and summarized above.  Clinical condition: appears uncomfortable but SpO2 stable and heart rate stable and lab work reassuring.        Edwin Dada [facility] Pager [phone]

## 2015-12-16 NOTE — ED Provider Notes (Signed)
[place] DEPT Provider Note   CSN: [id number] Arrival date & time: 12/16/15  1300     History   Chief Complaint Chief Complaint  Patient presents with  . Chest Pain    HPI Olivia [name] is a 82 y.o. female with past medical history of COPD who presents emergency Department with chief complaint of squeezing chest tightness and shortness of breath. This has been a chronically progressive problems since July of this year. She has a new oxygen requirement over the past several weeks. History is given by the patient and her daughter. The patient states that she feels extremely short of breath and like someone is "squeezing all the air out of me." She states that she only felt significant relief when she leans far forward over her legs. She has had little relief with her normal COPD treatments including her albuterol treatments. She has been to her pulmonologist, multiple times, [facility], x-rays are unchanged. The patient denies a history of pulmonary embolism. Her daughter states that she she has been extremely. Her daughter states that she can barely walk 5 feet before she has to stop and catch her breath. This is all new. She also tells me that her daughter had PFTs done by Dr. Melvyn Novas, which showed that she was in the 50th percentile. Dr. Melvyn Novas told them that her COPD did not seem to be the issue causing her severe shortness of breath. She denies hemoptysis, soaking night sweats, unexplained weight loss.  HPI  Past Medical History:  Diagnosis Date  . Anxiety   . Cancer ([place])    squamous cell rectal area  . COPD (chronic obstructive pulmonary disease) ([place])   . GERD (gastroesophageal reflux disease)    occ  . Heart murmur   . History of recurrent UTIs   . Hyperlipidemia   . Hypertension   . Hypothyroidism   . Left lumbar radiculopathy   . Myopathy   . Osteoarthritis   . Pneumonia 1/09   hx  . Shortness of breath     Patient Active Problem List   Diagnosis Date Noted  . Chest  pain 12/16/2015  . Mediastinal mass 12/16/2015  . Chronic respiratory failure with hypoxia ([place]) 12/09/2015  . COPD exacerbation ([place]) 09/10/2015  . Diastolic dysfunction [id number]  . Dyspnea 07/11/2015  . Upper airway cough syndrome 05/29/2015  . Hypokalemia 11/23/2013  . Spinal stenosis of lumbar region 11/21/2013  . Elevated serum creatinine 09/15/2013  . Abnormal ECG 03/10/2012  . Chest pressure 03/10/2012  . Hypothyroidism 01/11/2012  . Other abnormal glucose 08/24/2011  . Mood swings ([place]) 03/19/2009  . ANXIETY STATE, UNSPECIFIED 01/02/2009  . Essential hypertension 01/02/2009  . ELECTROCARDIOGRAM, ABNORMAL 01/02/2009  . TINNITUS, LEFT 04/09/2008  . NEUROPATHY 12/07/2007  . Disturbance in sleep behavior 12/07/2007  . MEMORY LOSS 12/07/2007  . CARCINOMA IN SITU OF SKIN SITE UNSPECIFIED 10/17/2007  . PORTAL HYPERTENSION 10/17/2007  . EXTERNAL HEMORRHOIDS 10/16/2007  . COLONIC POLYPS, HYPERPLASTIC, HX OF 10/16/2007  . MALIG NEOPLASM CONNECTIVE&OTH SOFT TISSUE PELVIS 09/19/2007  . COPD GOLD III with atypical upper airway features  03/07/2007  . Hyperlipidemia 10/25/2006  . MYOPATHY NOS 10/25/2006  . LUMBAR RADICULOPATHY, LEFT 10/25/2006    Past Surgical History:  Procedure Laterality Date  . CATARACT EXTRACTION, BILATERAL    . colonoscopy with polypectomy    . DILATION AND CURETTAGE OF UTERUS    . GLAUCOMA SURGERY    . MICROLARYNGOSCOPY Right 04/26/2012   Procedure: MICROLARYNGOSCOPY WITH EXCISION OF VOCAL CORD LESION;  Surgeon: Izora Gala, MD;  Location: [facility] OR;  Service: ENT;  Laterality: Right;  . squamous cell cancer  resection     rectal; Dr Morton Stall , [facility]  . uterine ablation     Dr Ubaldo Glassing    OB History    No data available       Home Medications    Prior to Admission medications   Medication Sig Start Date End Date Taking? Authorizing Provider  albuterol (PROVENTIL) (2.5 MG/3ML) 0.083% nebulizer solution Take 3 mLs (2.5 mg total) by nebulization every 6  (six) hours as needed for wheezing or shortness of breath. 12/04/15  Yes Tanda Rockers, MD  aspirin 81 MG tablet Take 81 mg by mouth daily. Reported on 05/28/2015   Yes Historical Provider, MD  chlorthalidone (HYGROTON) 25 MG tablet TAKE ONE-HALF TABLET BY MOUTH ONCE DAILY 08/06/15  Yes Binnie Rail, MD  cimetidine (TAGAMET) 200 MG tablet Take 200 mg by mouth daily.   Yes Historical Provider, MD  docusate sodium (COLACE) 100 MG capsule Take 100 mg by mouth daily as needed.    Yes Historical Provider, MD  FIBER PO Take 1 tablet by mouth daily as needed.    Yes Historical Provider, MD  Fluticasone-Salmeterol (ADVAIR DISKUS) 250-50 MCG/DOSE AEPB One puff twice daily 12/09/15  Yes Tanda Rockers, MD  hydroxypropyl methylcellulose (ISOPTO TEARS) 2.5 % ophthalmic solution Place 2 drops into both eyes 4 (four) times daily as needed (for dry eyes).   Yes Historical Provider, MD  levothyroxine (SYNTHROID, LEVOTHROID) 50 MCG tablet 1/2 tablet daily 11/21/15  Yes Tanda Rockers, MD  magic mouthwash SOLN Take 5 mLs by mouth.   Yes Historical Provider, MD  metoprolol succinate (TOPROL-XL) 25 MG 24 hr tablet 1/2 tab by mouth daily 07/11/15  Yes Binnie Rail, MD  pantoprazole (PROTONIX) 40 MG tablet Take 1 tablet (40 mg total) by mouth daily. Take 30-60 min before first meal of the day 09/17/15  Yes Tanda Rockers, MD  PARoxetine (PAXIL) 20 MG tablet TAKE ONE TABLET BY MOUTH ONCE DAILY 12/09/15  Yes Binnie Rail, MD  umeclidinium bromide (INCRUSE ELLIPTA) 62.5 MCG/INH AEPB Inhale 1 puff into the lungs daily. 12/09/15  Yes Tanda Rockers, MD  VENTOLIN HFA 108 (90 Base) MCG/ACT inhaler INHALE TWO PUFFS BY MOUTH EVERY 6 HOURS AS NEEDED FOR WHEEZING FOR SHORTNESS OF BREATH 11/27/15  Yes Tanda Rockers, MD  famotidine (PEPCID) 20 MG tablet One at bedtime Patient not taking: Reported on 12/16/2015 12/09/15   Tanda Rockers, MD  methocarbamol (ROBAXIN) 500 MG tablet Take 1 tablet (500 mg total) by mouth 3 (three) times  daily as needed for muscle spasms. 12/16/15   Margarita Mail, PA-C  nystatin (MYCOSTATIN) 100000 UNIT/ML suspension Take 10 mLs (1,000,000 Units total) by mouth 4 (four) times daily. Patient not taking: Reported on 12/16/2015 11/25/15   Tanda Rockers, MD    Family History Family History  Problem Relation Age of Onset  . Arthritis Mother   . Diabetes Mother   . Heart disease Mother     MI @ 38  . Kidney disease Father   . Heart disease Father     Rheumatic Heart Disease  . Asthma Sister   . Cancer Brother     brother breast cancer, melanoma  . Stroke Maternal Grandfather     ? age  . Stroke Paternal Grandfather 58    Social History Social History  Substance Use Topics  . Smoking  status: Former Smoker    Packs/day: 1.00    Years: 57.00    Types: Cigarettes    Quit date: 02/26/2007  . Smokeless tobacco: Never Used     Comment: smoked age 22- 53 up to 1 ppd  occ alcohol  . Alcohol use Yes     Allergies   Diclofenac; Acetaminophen; Sulfa antibiotics; Ultram [tramadol]; Fluvastatin sodium; and Meperidine hcl   Review of Systems Review of Systems Ten systems reviewed and are negative for acute change, except as noted in the HPI.    Physical Exam Updated Vital Signs BP (!) 177/86   Pulse 77   Temp 97.8 F (36.6 C) (Oral)   Resp 19   Ht 5\' 1"  (1.549 m)   Wt 66.3 kg   SpO2 97%   BMI 27.62 kg/m   Physical Exam  Constitutional: She is oriented to person, place, and time. She appears well-developed and well-nourished. No distress.  Elderly female, mildly dyspneic at rest  HENT:  Head: Normocephalic and atraumatic.  Eyes: Conjunctivae are normal. No scleral icterus.  Neck: Normal range of motion.  Cardiovascular: Normal rate, regular rhythm and normal heart sounds.  Exam reveals no gallop and no friction rub.   No murmur heard. Pulmonary/Chest: No respiratory distress.  Abdominal: Soft. Bowel sounds are normal. She exhibits no distension and no mass. There is no  tenderness. There is no guarding.  Neurological: She is alert and oriented to person, place, and time.  Skin: Skin is warm and dry. She is not diaphoretic.  Nursing note and vitals reviewed.    ED Treatments / Results  Labs (all labs ordered are listed, but only abnormal results are displayed) Labs Reviewed  BASIC METABOLIC PANEL - Abnormal; Notable for the following:       Result Value   Sodium 129 (*)    Chloride 93 (*)    GFR calc non Af Amer 59 (*)    All other components within normal limits  CBC - Abnormal; Notable for the following:    WBC 3.6 (*)    Platelets 145 (*)    All other components within normal limits  OSMOLALITY - Abnormal; Notable for the following:    Osmolality 269 (*)    All other components within normal limits  BLOOD GAS, ARTERIAL - Abnormal; Notable for the following:    pH, Arterial 7.291 (*)    pCO2 arterial 61.7 (*)    pO2, Arterial 55.6 (*)    Bicarbonate 28.8 (*)    Acid-Base Excess 2.8 (*)    All other components within normal limits  CBC WITH DIFFERENTIAL/PLATELET - Abnormal; Notable for the following:    WBC 3.8 (*)    All other components within normal limits  COMPREHENSIVE METABOLIC PANEL - Abnormal; Notable for the following:    Sodium 123 (*)    Chloride 85 (*)    Calcium 8.8 (*)    Total Protein 6.4 (*)    Albumin 3.1 (*)    AST 67 (*)    All other components within normal limits  MAGNESIUM - Abnormal; Notable for the following:    Magnesium 1.4 (*)    All other components within normal limits  RENAL FUNCTION PANEL - Abnormal; Notable for the following:    Sodium 122 (*)    Chloride 84 (*)    Glucose, Bld 143 (*)    Calcium 8.6 (*)    Albumin 3.0 (*)    All other components within normal limits  CBC  WITH DIFFERENTIAL/PLATELET - Abnormal; Notable for the following:    WBC 1.9 (*)    Platelets 146 (*)    Neutro Abs 1.5 (*)    Lymphs Abs 0.4 (*)    Monocytes Absolute 0.0 (*)    All other components within normal limits    COMPREHENSIVE METABOLIC PANEL - Abnormal; Notable for the following:    Sodium 122 (*)    Chloride 85 (*)    Glucose, Bld 144 (*)    Calcium 8.5 (*)    Total Protein 6.4 (*)    Albumin 3.1 (*)    AST 66 (*)    All other components within normal limits  BLOOD GAS, ARTERIAL - Abnormal; Notable for the following:    pCO2 arterial 53.6 (*)    pO2, Arterial 78.0 (*)    Bicarbonate 28.9 (*)    Acid-Base Excess 3.7 (*)    All other components within normal limits  SODIUM - Abnormal; Notable for the following:    Sodium 121 (*)    All other components within normal limits  I-STAT VENOUS BLOOD GAS, ED - Abnormal; Notable for the following:    pCO2, Ven 60.2 (*)    Bicarbonate 30.8 (*)    Acid-Base Excess 3.0 (*)    All other components within normal limits  MRSA PCR SCREENING  BRAIN NATRIURETIC PEPTIDE  TROPONIN I  SODIUM, URINE, RANDOM  PROCALCITONIN  OSMOLALITY, URINE  TROPONIN I  TROPONIN I  PHOSPHORUS  TROPONIN I  TROPONIN I  TROPONIN I  PROCALCITONIN  MAGNESIUM  PHOSPHORUS  I-STAT TROPOININ, ED    EKG  EKG Interpretation  Date/Time:  Tuesday December 16 2015 18:47:11 EDT Ventricular Rate:  64 PR Interval:    QRS Duration: 131 QT Interval:  583 QTC Calculation: 611 R Axis:   6 Text Interpretation:  Normal sinus rhythm Right bundle branch block Confirmed by ISAACS MD, Lysbeth Galas [phone]) on 12/17/2015 12:15:42 AM       Radiology Ct Angio Chest Pe W And/or Wo Contrast  Result Date: 12/16/2015 CLINICAL DATA:  Shortness of breath.  Chest pain. EXAM: CT ANGIOGRAPHY CHEST WITH CONTRAST TECHNIQUE: Multidetector CT imaging of the chest was performed using the standard protocol during bolus administration of intravenous contrast. Multiplanar CT image reconstructions and MIPs were obtained to evaluate the vascular anatomy. CONTRAST:  60 mL of Isovue 370 intravenously. COMPARISON:  Radiographs of December 16, 2015. CT scan of July 29, 2007. FINDINGS: Cardiovascular:  Atherosclerosis of thoracic aorta is noted without aneurysm. There is no evidence of pulmonary embolus. Coronary artery calcifications are noted. Mediastinum/Nodes: 3.5 x 2.8 cm sub carinal mass is noted. 18 mm precarinal lymph node is noted. Lungs/Pleura: No pneumothorax or significant pleural effusion is noted. Mild bibasilar subsegmental atelectasis is noted. Mild emphysematous disease is noted in the upper lobes bilaterally. Upper Abdomen: Visualized portion of upper abdomen is unremarkable. Musculoskeletal: No significant osseous abnormality is noted. Review of the MIP images confirms the above findings. IMPRESSION: Aortic atherosclerosis. Coronary artery calcifications are noted suggesting coronary artery disease. No definite evidence of pulmonary embolus. Mediastinal adenopathy is noted concerning for possible metastatic disease or malignancy. Clinical correlation is recommended. Mild emphysematous disease is noted in the upper lobes bilaterally. Electronically Signed   By: Marijo Conception, M.D.   On: 12/16/2015 15:46   Dg Chest Port 1 View  Result Date: 12/18/2015 CLINICAL DATA:  Shortness of breath. EXAM: PORTABLE CHEST 1 VIEW COMPARISON:  12/17/2015 FINDINGS: The cardiac silhouette is borderline enlarged. Thoracic  aortic atherosclerosis is again noted. Subcarinal lymphadenopathy/mass is better seen on recent CT. Less gaseous distension of hiatal hernia. Lung volumes remain diminished with unchanged bibasilar opacities. No sizable pleural effusion or pneumothorax is identified. IMPRESSION: 1. Low lung volumes with unchanged bibasilar atelectasis. 2. Subcarinal lymphadenopathy/mass as demonstrated on recent CT. 3. Aortic atherosclerosis. Electronically Signed   By: Logan Bores M.D.   On: 12/18/2015 08:37   Dg Chest Port 1 View  Result Date: 12/17/2015 CLINICAL DATA:  Shortness of breath. EXAM: PORTABLE CHEST 1 VIEW COMPARISON:  CT chest and chest radiograph 12/16/2015. FINDINGS: Patient is  rotated. Heart size is accentuated by AP semi upright technique and low lung volumes. Thoracic aorta is calcified. Subcarinal mass, as on yesterday's CT exam. Bibasilar subsegmental atelectasis. Lungs are otherwise clear. No pleural fluid. Moderate hiatal hernia. IMPRESSION: 1. Low lung volumes with bibasilar atelectasis. 2. Subcarinal mass, better seen on yesterday's CT chest. 3.  Aortic atherosclerosis (ICD10-170.0). Electronically Signed   By: Lorin Picket M.D.   On: 12/17/2015 08:56   Dg Chest Portable 1 View  Result Date: 12/16/2015 CLINICAL DATA:  Shortness of breath, chest pain for 1 week. EXAM: PORTABLE CHEST 1 VIEW COMPARISON:  12/09/2015 FINDINGS: Cardiomegaly. There is hyperinflation of the lungs compatible with COPD. Linear scarring or atelectasis in the lung bases. Suspect small effusions. No acute bony abnormality. IMPRESSION: Linear bibasilar atelectasis or scarring.  Suspect small effusions. Mild cardiomegaly.  COPD. Electronically Signed   By: Rolm Baptise M.D.   On: 12/16/2015 14:02   Dg Abd Portable 1v  Result Date: 12/17/2015 CLINICAL DATA:  Left-sided abdominal pain and nausea for the past 2 days. EXAM: PORTABLE ABDOMEN - 1 VIEW COMPARISON:  Chest CT from 1 day prior, same day chest radiograph l. FINDINGS: The bowel gas pattern is nonspecific without free air or bowel obstruction noted. There are streaky parenchymal airspace opacities at the lung bases consistent with bibasilar atelectasis. No effusion is identified. There is no free air. There is mild dextroconvex scoliosis of the upper lumbar spine at L2. There is lumbar spondylosis and multilevel lumbar facet arthropathy. No acute osseous abnormality. IMPRESSION EDIT: IMPRESSION EDIT No bowel obstruction or free air. Electronically Signed   By: Ashley Royalty M.D.   On: 12/17/2015 20:01    Procedures Procedures (including critical care time)  Medications Ordered in ED Medications  ondansetron (ZOFRAN) injection 4 mg (4 mg  Intravenous Given 12/16/15 2134)  albuterol (PROVENTIL) (2.5 MG/3ML) 0.083% nebulizer solution 2.5 mg (not administered)  famotidine (PEPCID) tablet 10 mg (10 mg Oral Given 12/17/15 2132)  PARoxetine (PAXIL) tablet 20 mg (20 mg Oral Given 12/17/15 1214)  levothyroxine (SYNTHROID, LEVOTHROID) tablet 25 mcg (25 mcg Oral Given 12/18/15 0612)  pantoprazole (PROTONIX) EC tablet 40 mg (40 mg Oral Given 12/18/15 0612)  aspirin chewable tablet 81 mg (81 mg Oral Given 12/17/15 1214)  acetaminophen (TYLENOL) tablet 650 mg (not administered)  enoxaparin (LOVENOX) injection 40 mg (40 mg Subcutaneous Given 12/18/15 0613)  aspirin EC tablet 325 mg (325 mg Oral Not Given 12/17/15 0325)  ipratropium-albuterol (DUONEB) 0.5-2.5 (3) MG/3ML nebulizer solution 3 mL (3 mLs Nebulization Given 12/18/15 0906)  methylPREDNISolone sodium succinate (SOLU-MEDROL) 40 mg/mL injection 40 mg (40 mg Intravenous Given 12/18/15 0332)  0.9 %  sodium chloride infusion ( Intravenous New Bag/Given 12/17/15 2023)  MEDLINE mouth rinse (15 mLs Mouth Rinse Given 12/18/15 0000)  albuterol (PROVENTIL) (2.5 MG/3ML) 0.083% nebulizer solution 5 mg (5 mg Nebulization Given 12/16/15 1428)  ipratropium (ATROVENT) nebulizer  solution 0.5 mg (0.5 mg Nebulization Given 12/16/15 1428)  iopamidol (ISOVUE-370) 76 % injection (60 mLs  Contrast Given 12/16/15 1522)  sodium chloride 0.9 % bolus 1,000 mL (0 mLs Intravenous Stopped 12/16/15 2009)  ipratropium-albuterol (DUONEB) 0.5-2.5 (3) MG/3ML nebulizer solution 3 mL (3 mLs Nebulization Given 12/16/15 2136)  ondansetron (ZOFRAN) 4 MG/2ML injection (  Duplicate [id number])  sodium chloride 0.9 % bolus 500 mL (500 mLs Intravenous Given 12/17/15 2022)  magnesium sulfate IVPB 2 g 50 mL (2 g Intravenous Given 12/17/15 2022)     Initial Impression / Assessment and Plan / ED Course  I have reviewed the triage vital signs and the nursing notes.  Pertinent labs & imaging results that were available  during my care of the patient were reviewed by me and considered in my medical decision making (see chart for details).  Clinical Course  Comment By Time  Patient with worsening oxygen requirement. CT Angio shows a subcarinal mass and lymph node, suspicious for metastatic disease. This is likely the cause of the patient's severe dyspnea. During her visit here. The patient comes extremely winded and has a oxygen saturation that dropped into the mid 80s with minimal effort, including adjusting herself in the bed or moving to sit up. At rest, on 4 L, which is 2 L above her baseline. She is able to maintain O2 sats at 92%. I spoken with Carolynn Serve at dance practice provider at [facility] pulmonology, who actually saw this patient today. She acknowledges the patient's significant decline and agrees with beginning inpatient work up given her heightened oxygen demand.  Margarita Mail, PA-C 10/24 1652  This patinet was admitted for her mediastinal mass but decided that she would be much more comfortable at home. Dr. Rockne Menghini saw the patient here and wrote a consult note. Prior to discharge, the patient was found to be very hypotensive. With positive orthostatics and lightheadedness with standing. Patient still looked like discharge. I have agreed that the patient can be given a bag of fluid and we will recheck her vital signs. If her vitals drop with standing. I do not feel she'll be safe to leave for the risk of fall. I discussed this with the patient. Margarita Mail, PA-C 10/24 1913   I have given sign out to PA [place] who will assume care of the patient.  Final Clinical Impressions(s) / ED Diagnoses   Final diagnoses:  Mediastinal mass  Hypoxia  Dyspnea  SOB (shortness of breath)    New Prescriptions Current Discharge Medication List       Margarita Mail, PA-C 12/18/15 [place], MD 12/23/15 2311

## 2015-12-17 ENCOUNTER — Encounter (HOSPITAL_COMMUNITY): Payer: Self-pay

## 2015-12-17 ENCOUNTER — Observation Stay (HOSPITAL_COMMUNITY): Payer: Medicare Other

## 2015-12-17 DIAGNOSIS — J441 Chronic obstructive pulmonary disease with (acute) exacerbation: Secondary | ICD-10-CM

## 2015-12-17 DIAGNOSIS — R079 Chest pain, unspecified: Secondary | ICD-10-CM | POA: Diagnosis not present

## 2015-12-17 DIAGNOSIS — J9611 Chronic respiratory failure with hypoxia: Secondary | ICD-10-CM | POA: Diagnosis not present

## 2015-12-17 DIAGNOSIS — R1010 Upper abdominal pain, unspecified: Secondary | ICD-10-CM

## 2015-12-17 DIAGNOSIS — J449 Chronic obstructive pulmonary disease, unspecified: Secondary | ICD-10-CM | POA: Diagnosis not present

## 2015-12-17 DIAGNOSIS — R918 Other nonspecific abnormal finding of lung field: Secondary | ICD-10-CM

## 2015-12-17 DIAGNOSIS — J9601 Acute respiratory failure with hypoxia: Secondary | ICD-10-CM | POA: Diagnosis not present

## 2015-12-17 DIAGNOSIS — J9859 Other diseases of mediastinum, not elsewhere classified: Secondary | ICD-10-CM | POA: Diagnosis not present

## 2015-12-17 DIAGNOSIS — J9602 Acute respiratory failure with hypercapnia: Secondary | ICD-10-CM

## 2015-12-17 LAB — BLOOD GAS, ARTERIAL
ACID-BASE EXCESS: 2.8 mmol/L — AB (ref 0.0–2.0)
BICARBONATE: 28.8 mmol/L — AB (ref 20.0–28.0)
DRAWN BY: 460981
O2 SAT: 83.5 %
PCO2 ART: 61.7 mmHg — AB (ref 32.0–48.0)
Patient temperature: 98.6
pH, Arterial: 7.291 — ABNORMAL LOW (ref 7.350–7.450)
pO2, Arterial: 55.6 mmHg — ABNORMAL LOW (ref 83.0–108.0)

## 2015-12-17 LAB — CBC WITH DIFFERENTIAL/PLATELET
Basophils Absolute: 0 10*3/uL (ref 0.0–0.1)
Basophils Relative: 1 %
EOS ABS: 0 10*3/uL (ref 0.0–0.7)
EOS PCT: 1 %
HCT: 36.8 % (ref 36.0–46.0)
Hemoglobin: 12.8 g/dL (ref 12.0–15.0)
LYMPHS ABS: 1 10*3/uL (ref 0.7–4.0)
LYMPHS PCT: 26 %
MCH: 31.5 pg (ref 26.0–34.0)
MCHC: 34.8 g/dL (ref 30.0–36.0)
MCV: 90.6 fL (ref 78.0–100.0)
MONO ABS: 0.2 10*3/uL (ref 0.1–1.0)
Monocytes Relative: 4 %
Neutro Abs: 2.6 10*3/uL (ref 1.7–7.7)
Neutrophils Relative %: 68 %
PLATELETS: 151 10*3/uL (ref 150–400)
RBC: 4.06 MIL/uL (ref 3.87–5.11)
RDW: 13.9 % (ref 11.5–15.5)
WBC: 3.8 10*3/uL — AB (ref 4.0–10.5)

## 2015-12-17 LAB — COMPREHENSIVE METABOLIC PANEL
ALT: 34 U/L (ref 14–54)
ANION GAP: 7 (ref 5–15)
AST: 67 U/L — ABNORMAL HIGH (ref 15–41)
Albumin: 3.1 g/dL — ABNORMAL LOW (ref 3.5–5.0)
Alkaline Phosphatase: 70 U/L (ref 38–126)
BUN: 12 mg/dL (ref 6–20)
CHLORIDE: 85 mmol/L — AB (ref 101–111)
CO2: 31 mmol/L (ref 22–32)
Calcium: 8.8 mg/dL — ABNORMAL LOW (ref 8.9–10.3)
Creatinine, Ser: 0.8 mg/dL (ref 0.44–1.00)
Glucose, Bld: 96 mg/dL (ref 65–99)
POTASSIUM: 3.9 mmol/L (ref 3.5–5.1)
SODIUM: 123 mmol/L — AB (ref 135–145)
Total Bilirubin: 0.8 mg/dL (ref 0.3–1.2)
Total Protein: 6.4 g/dL — ABNORMAL LOW (ref 6.5–8.1)

## 2015-12-17 LAB — TROPONIN I
Troponin I: <0.03 ng/mL (ref <0.03)
Troponin I: <0.03 ng/mL (ref <0.03)

## 2015-12-17 LAB — MAGNESIUM: MAGNESIUM: 1.4 mg/dL — AB (ref 1.7–2.4)

## 2015-12-17 LAB — PROCALCITONIN

## 2015-12-17 LAB — OSMOLALITY: Osmolality: 269 mOsm/kg — ABNORMAL LOW (ref 275–295)

## 2015-12-17 LAB — OSMOLALITY, URINE: OSMOLALITY UR: 618 mosm/kg (ref 300–900)

## 2015-12-17 LAB — PHOSPHORUS: PHOSPHORUS: 3.2 mg/dL (ref 2.5–4.6)

## 2015-12-17 LAB — SODIUM, URINE, RANDOM: Sodium, Ur: 147 mmol/L

## 2015-12-17 MED ORDER — MOMETASONE FURO-FORMOTEROL FUM 200-5 MCG/ACT IN AERO
2.0000 | INHALATION_SPRAY | Freq: Two times a day (BID) | RESPIRATORY_TRACT | Status: DC
  Administered 2015-12-17: 2 via RESPIRATORY_TRACT
  Filled 2015-12-17: qty 8.8

## 2015-12-17 MED ORDER — ORAL CARE MOUTH RINSE
15.0000 mL | Freq: Two times a day (BID) | OROMUCOSAL | Status: DC
  Administered 2015-12-18 – 2015-12-24 (×8): 15 mL via OROMUCOSAL

## 2015-12-17 MED ORDER — ASPIRIN EC 325 MG PO TBEC: 325.0000 mg | DELAYED_RELEASE_TABLET | Freq: Once | ORAL | Status: DC

## 2015-12-17 MED ORDER — METHYLPREDNISOLONE SODIUM SUCC 40 MG IJ SOLR
40.0000 mg | Freq: Four times a day (QID) | INTRAMUSCULAR | Status: DC
  Administered 2015-12-17 – 2015-12-18 (×4): 40 mg via INTRAVENOUS
  Filled 2015-12-17 (×4): qty 1

## 2015-12-17 MED ORDER — MAGNESIUM SULFATE 2 GM/50ML IV SOLN
2.0000 g | Freq: Once | INTRAVENOUS | Status: AC
  Administered 2015-12-17: 2 g via INTRAVENOUS
  Filled 2015-12-17: qty 50

## 2015-12-17 MED ORDER — ASPIRIN 81 MG PO CHEW
81.0000 mg | CHEWABLE_TABLET | Freq: Every day | ORAL | Status: DC
  Administered 2015-12-17 – 2015-12-24 (×8): 81 mg via ORAL
  Filled 2015-12-17 (×8): qty 1

## 2015-12-17 MED ORDER — LEVOTHYROXINE SODIUM 25 MCG PO TABS
25.0000 ug | ORAL_TABLET | Freq: Every day | ORAL | Status: DC
  Administered 2015-12-17 – 2015-12-23 (×7): 25 ug via ORAL
  Filled 2015-12-17 (×7): qty 1

## 2015-12-17 MED ORDER — LORAZEPAM 2 MG/ML IJ SOLN
0.5000 mg | Freq: Four times a day (QID) | INTRAMUSCULAR | Status: DC | PRN
  Administered 2015-12-17: 0.5 mg via INTRAVENOUS
  Filled 2015-12-17: qty 1

## 2015-12-17 MED ORDER — UMECLIDINIUM BROMIDE 62.5 MCG/INH IN AEPB
1.0000 | INHALATION_SPRAY | Freq: Every day | RESPIRATORY_TRACT | Status: DC
  Administered 2015-12-17: 1 via RESPIRATORY_TRACT
  Filled 2015-12-17: qty 7

## 2015-12-17 MED ORDER — SODIUM CHLORIDE 0.9 % IV BOLUS (SEPSIS)
500.0000 mL | Freq: Once | INTRAVENOUS | Status: AC
  Administered 2015-12-17: 500 mL via INTRAVENOUS

## 2015-12-17 MED ORDER — ENOXAPARIN SODIUM 40 MG/0.4ML ~~LOC~~ SOLN
40.0000 mg | SUBCUTANEOUS | Status: DC
  Administered 2015-12-17 – 2015-12-23 (×7): 40 mg via SUBCUTANEOUS
  Filled 2015-12-17 (×7): qty 0.4

## 2015-12-17 MED ORDER — PANTOPRAZOLE SODIUM 40 MG PO TBEC
40.0000 mg | DELAYED_RELEASE_TABLET | Freq: Every day | ORAL | Status: DC
  Administered 2015-12-17 – 2015-12-24 (×8): 40 mg via ORAL
  Filled 2015-12-17 (×9): qty 1

## 2015-12-17 MED ORDER — SODIUM CHLORIDE 0.9 % IV SOLN
INTRAVENOUS | Status: DC
  Administered 2015-12-17: 20:00:00 via INTRAVENOUS
  Administered 2015-12-18: 75 mL/h via INTRAVENOUS

## 2015-12-17 MED ORDER — GI COCKTAIL ~~LOC~~
30.0000 mL | Freq: Four times a day (QID) | ORAL | Status: DC | PRN
  Filled 2015-12-17: qty 30

## 2015-12-17 MED ORDER — PAROXETINE HCL 20 MG PO TABS
20.0000 mg | ORAL_TABLET | Freq: Every day | ORAL | Status: DC
  Administered 2015-12-17 – 2015-12-19 (×3): 20 mg via ORAL
  Filled 2015-12-17 (×3): qty 1

## 2015-12-17 MED ORDER — ACETAMINOPHEN 325 MG PO TABS
650.0000 mg | ORAL_TABLET | ORAL | Status: DC | PRN
  Administered 2015-12-19 – 2015-12-21 (×7): 650 mg via ORAL
  Filled 2015-12-17 (×7): qty 2

## 2015-12-17 MED ORDER — IPRATROPIUM-ALBUTEROL 0.5-2.5 (3) MG/3ML IN SOLN
3.0000 mL | Freq: Four times a day (QID) | RESPIRATORY_TRACT | Status: DC
  Administered 2015-12-17 – 2015-12-20 (×12): 3 mL via RESPIRATORY_TRACT
  Filled 2015-12-17 (×12): qty 3

## 2015-12-17 MED ORDER — FAMOTIDINE 20 MG PO TABS
10.0000 mg | ORAL_TABLET | Freq: Every day | ORAL | Status: DC
  Administered 2015-12-17 – 2015-12-23 (×7): 10 mg via ORAL
  Filled 2015-12-17 (×7): qty 1

## 2015-12-17 NOTE — Care Management Obs Status (Signed)
[place] NOTIFICATION   Patient Details  Name: Olivia [name] MRN: [id number] Date of Birth: 1931-04-24   Medicare Observation Status Notification Given:  Yes    Dawayne Patricia, RN 12/17/2015, 4:30 PM

## 2015-12-17 NOTE — Progress Notes (Signed)
Pt transferred to 4E. Report given to RN. Pt on o2 and monitor. Daughter updated on plan of care and at the bedside.

## 2015-12-17 NOTE — Progress Notes (Signed)
Called to bedside for increased patient respiratory effort.  Per RN and RT, patient with increased WOB overnight, RR >30.  In last hour, has been able to sleep, now appears somewhat more comfortable, but still respirations >20, shallow, mouth breathing.    BP (!) 126/58 (BP Location: Left Arm)   Pulse 67   Temp 97.7 F (36.5 C) (Oral)   Resp 20   Ht 5\' 1"  (1.549 m)   Wt 64.6 kg (142 lb 8 oz)   SpO2 93%   BMI 26.93 kg/m   Respirations still shallow.  New crackles at bases.  Oriented to place and situation.  Still tripod position.   Assessment: SpO2 erratic overnight.  Mostly low 90s with O2 4L by [place].  Patient persistently SOB.  -Check ABG -Repeat CXR now given new crackles -Low threshold to transfer to SDU and start BiPAP

## 2015-12-17 NOTE — Progress Notes (Signed)
Nutrition Brief Note  Patient identified on the Malnutrition Screening Tool (MST) Report.  Pt has had a 8% weight loss since January 2017; not significant for time frame.  Wt Readings from Last 15 Encounters:  12/17/15 142 lb 8 oz (64.6 kg)  12/16/15 143 lb 3.2 oz (65 kg)  12/09/15 141 lb 9.6 oz (64.2 kg)  11/21/15 145 lb 3.2 oz (65.9 kg)  11/10/15 143 lb (64.9 kg)  10/08/15 141 lb 9.6 oz (64.2 kg)  09/17/15 144 lb 12.8 oz (65.7 kg)  09/10/15 146 lb (66.2 kg)  07/11/15 150 lb 3 oz (68.1 kg)  05/28/15 153 lb (69.4 kg)  02/27/15 155 lb 12.8 oz (70.7 kg)  02/05/15 158 lb 6.4 oz (71.8 kg)  08/28/14 152 lb (68.9 kg)  07/03/14 153 lb 6.4 oz (69.6 kg)  11/21/13 158 lb (71.7 kg)    Body mass index is 26.93 kg/m. Patient meets criteria for Overweight based on current BMI.   Current diet order is Heart Healthy. Labs and medications reviewed.   No nutrition interventions warranted at this time. If nutrition issues arise, please consult RD.   Arthur Holms, RD, LDN Pager #: [phone] After-Hours Pager #: [phone]

## 2015-12-17 NOTE — Progress Notes (Signed)
Chart and office note reviewed in detail  > agree with a/p as outlined    

## 2015-12-17 NOTE — Progress Notes (Signed)
Pt complaining of extreme thirst. Pt agitated and anxious. Emotional support given and MD notified. Will continue to monitor.

## 2015-12-17 NOTE — Progress Notes (Signed)
Pt arrived to unit. Vital signs stable. Reviewed plan of care with pt and daughter. Pt complained of thirst. Mouth moisturizer given. Pt oriented to room. Will continue to follow

## 2015-12-17 NOTE — Consult Note (Signed)
Name: Olivia [name] MRN: [id number] DOB: Oct 27, 1931    ADMISSION DATE:  12/16/2015 CONSULTATION DATE:  12/16/15  REFERRING MD :  Dr. Alfredia Ferguson  CHIEF COMPLAINT:  SOB    HISTORY OF PRESENT ILLNESS:  82 y/o F, former smoker (quit 2009),  with PMH of anxiety, GERD, recurrent UTI's, HTN, HLD, lumbar radiculopathy, myopathy, osteoarthritis, hypothyroidism and 2L O2 dependent GOLD III COPD (spirometry 01/2012 with an FEV1 of 1.01 / 48% predicted)who presented to [facility] on 10/24 with several months of progressive shortness of breath.    She was seen in the pulmonary office on 10/24 with the above complaints.  The patient reported several months of progressive shortness of breath & "squeezing" sensation in her chest.  Symptoms were relieved with sitting up and leaning forward.  She was referred (reluctantly) to the ER for evaluation.  Initial evaluation included an EKG which did not show ischemic changes.  She developed orthostatic hypotension with SBPs in the 50's upon standing.  She was treated with IVF's with improvement.  O2 needs increased in ER with a clear CXR.  Labs - Na 129, K 3.6, Cl 93, BUN 16, Sr Cr 0.89, glucose 92, troponin 0.0, BNP 57, PCT < 0.10, WBC 3.6, Hgb 12.3 and platelets 145.  Initial CXR showed linear bibasilar atelectasis or scarring, small effusions, mild cardiomegaly. Given O2 needs the patient was evaluated with a CTA of the chest which was negative for PE but showed a 3.5 x 2.8 cm sub carinal mass, mediastinal adenopathy and mild emphysematous disease in the upper lobes bilaterally.   PCCM consulted for respiratory distress.  PAST MEDICAL HISTORY :   has a past medical history of Anxiety; Cancer [facility]); COPD (chronic obstructive pulmonary disease) ([place]); GERD (gastroesophageal reflux disease); Heart murmur; History of recurrent UTIs; Hyperlipidemia; Hypertension; Hypothyroidism; Left lumbar radiculopathy; Myopathy; Osteoarthritis; Pneumonia (1/09); and Shortness of breath.  has a  past surgical history that includes uterine ablation; Dilation and curettage of uterus; Glaucoma surgery; colonoscopy with polypectomy; squamous cell cancer  resection; Cataract extraction, bilateral; and Microlaryngoscopy (Right, 04/26/2012).   Prior to Admission medications   Medication Sig Start Date End Date Taking? Authorizing Provider  albuterol (PROVENTIL) (2.5 MG/3ML) 0.083% nebulizer solution Take 3 mLs (2.5 mg total) by nebulization every 6 (six) hours as needed for wheezing or shortness of breath. 12/04/15  Yes Tanda Rockers, MD  aspirin 81 MG tablet Take 81 mg by mouth daily. Reported on 05/28/2015   Yes Historical Provider, MD  chlorthalidone (HYGROTON) 25 MG tablet TAKE ONE-HALF TABLET BY MOUTH ONCE DAILY 08/06/15  Yes Binnie Rail, MD  cimetidine (TAGAMET) 200 MG tablet Take 200 mg by mouth daily.   Yes Historical Provider, MD  docusate sodium (COLACE) 100 MG capsule Take 100 mg by mouth daily as needed.    Yes Historical Provider, MD  FIBER PO Take 1 tablet by mouth daily as needed.    Yes Historical Provider, MD  Fluticasone-Salmeterol (ADVAIR DISKUS) 250-50 MCG/DOSE AEPB One puff twice daily 12/09/15  Yes Tanda Rockers, MD  hydroxypropyl methylcellulose (ISOPTO TEARS) 2.5 % ophthalmic solution Place 2 drops into both eyes 4 (four) times daily as needed (for dry eyes).   Yes Historical Provider, MD  levothyroxine (SYNTHROID, LEVOTHROID) 50 MCG tablet 1/2 tablet daily 11/21/15  Yes Tanda Rockers, MD  magic mouthwash SOLN Take 5 mLs by mouth.   Yes Historical Provider, MD  metoprolol succinate (TOPROL-XL) 25 MG 24 hr tablet 1/2 tab by mouth daily 07/11/15  Yes Binnie Rail, MD  pantoprazole (PROTONIX) 40 MG tablet Take 1 tablet (40 mg total) by mouth daily. Take 30-60 min before first meal of the day 09/17/15  Yes Tanda Rockers, MD  PARoxetine (PAXIL) 20 MG tablet TAKE ONE TABLET BY MOUTH ONCE DAILY 12/09/15  Yes Binnie Rail, MD  umeclidinium bromide (INCRUSE ELLIPTA) 62.5 MCG/INH  AEPB Inhale 1 puff into the lungs daily. 12/09/15  Yes Tanda Rockers, MD  VENTOLIN HFA 108 (90 Base) MCG/ACT inhaler INHALE TWO PUFFS BY MOUTH EVERY 6 HOURS AS NEEDED FOR WHEEZING FOR SHORTNESS OF BREATH 11/27/15  Yes Tanda Rockers, MD  famotidine (PEPCID) 20 MG tablet One at bedtime Patient not taking: Reported on 12/16/2015 12/09/15   Tanda Rockers, MD  methocarbamol (ROBAXIN) 500 MG tablet Take 1 tablet (500 mg total) by mouth 3 (three) times daily as needed for muscle spasms. 12/16/15   Margarita Mail, PA-C  nystatin (MYCOSTATIN) 100000 UNIT/ML suspension Take 10 mLs (1,000,000 Units total) by mouth 4 (four) times daily. Patient not taking: Reported on 12/16/2015 11/25/15   Tanda Rockers, MD   Allergies  Allergen Reactions  . Diclofenac     Hives  . Acetaminophen     Elevated LFTs  . Sulfa Antibiotics Nausea And Vomiting  . Ultram [Tramadol]     vomiting  . Fluvastatin Sodium     Lescol caused flu symptoms  . Meperidine Hcl     vomiting    FAMILY HISTORY:  family history includes Arthritis in her mother; Asthma in her sister; Cancer in her brother; Diabetes in her mother; Heart disease in her father and mother; Kidney disease in her father; Stroke in her maternal grandfather; Stroke (age of onset: 16) in her paternal grandfather.   SOCIAL HISTORY:  reports that she quit smoking about 8 years ago. Her smoking use included Cigarettes. She has a 57.00 pack-year smoking history. She has never used smokeless tobacco. She reports that she drinks alcohol. She reports that she does not use drugs.  REVIEW OF SYSTEMS:  POSITIVES IN BOLD Constitutional: Negative for fever, chills, weight loss, malaise/fatigue and diaphoresis.  HENT: Negative for hearing loss, ear pain, nosebleeds, congestion, sore throat, neck pain, tinnitus and ear discharge.   Eyes: Negative for blurred vision, double vision, photophobia, pain, discharge and redness.  Respiratory: Negative for cough, hemoptysis, sputum  production, shortness of breath, wheezing and stridor.   Cardiovascular: Negative for chest pain "squeezing", palpitations, orthopnea, claudication, leg swelling and PND.  Gastrointestinal: Negative for heartburn, nausea, vomiting, abdominal pain, diarrhea, constipation, blood in stool and melena.  Genitourinary: Negative for dysuria, urgency, frequency, hematuria and flank pain.  Musculoskeletal: Negative for myalgias, back pain, joint pain and falls.  Skin: Negative for itching and rash.  Neurological: Negative for dizziness, tingling, tremors, sensory change, speech change, focal weakness, seizures, loss of consciousness, weakness and headaches.  Endo/Heme/Allergies: Negative for environmental allergies and polydipsia. Does not bruise/bleed easily.  SUBJECTIVE:   VITAL SIGNS: Temp:  [97.7 F (36.5 C)-98.6 F (37 C)] 97.7 F (36.5 C) (10/25 0500) Pulse Rate:  [51-80] 67 (10/25 0500) Resp:  [18-30] 30 (10/25 1520) BP: (85-132)/(42-107) 99/51 (10/25 1520) SpO2:  [88 %-99 %] 89 % (10/25 1520) Weight:  [142 lb 8 oz (64.6 kg)] 142 lb 8 oz (64.6 kg) (10/25 0100)  PHYSICAL EXAMINATION: General:  Frail elderly female in NAD Neuro:  Alert, oriented to self and place. Somewhat confused and slow to respond. Struggles word finding. Delirium HEENT:  [place]/AT, PERRL, no JVD Cardiovascular:  RRR, no MRG. No edema Lungs:  Poor air movement Abdomen:  Soft, non-tender, non-distended Musculoskeletal:  No acute deformity. Skin:  Grossly intact   Recent Labs Lab 12/16/15 1320  NA 129*  K 3.6  CL 93*  CO2 29  BUN 16  CREATININE 0.89  GLUCOSE 92    Recent Labs Lab 12/16/15 1320  HGB 12.3  HCT 36.1  WBC 3.6*  PLT 145*   Ct Angio Chest Pe W And/or Wo Contrast  Result Date: 12/16/2015 CLINICAL DATA:  Shortness of breath.  Chest pain. EXAM: CT ANGIOGRAPHY CHEST WITH CONTRAST TECHNIQUE: Multidetector CT imaging of the chest was performed using the standard protocol during bolus  administration of intravenous contrast. Multiplanar CT image reconstructions and MIPs were obtained to evaluate the vascular anatomy. CONTRAST:  60 mL of Isovue 370 intravenously. COMPARISON:  Radiographs of December 16, 2015. CT scan of July 29, 2007. FINDINGS: Cardiovascular: Atherosclerosis of thoracic aorta is noted without aneurysm. There is no evidence of pulmonary embolus. Coronary artery calcifications are noted. Mediastinum/Nodes: 3.5 x 2.8 cm sub carinal mass is noted. 18 mm precarinal lymph node is noted. Lungs/Pleura: No pneumothorax or significant pleural effusion is noted. Mild bibasilar subsegmental atelectasis is noted. Mild emphysematous disease is noted in the upper lobes bilaterally. Upper Abdomen: Visualized portion of upper abdomen is unremarkable. Musculoskeletal: No significant osseous abnormality is noted. Review of the MIP images confirms the above findings. IMPRESSION: Aortic atherosclerosis. Coronary artery calcifications are noted suggesting coronary artery disease. No definite evidence of pulmonary embolus. Mediastinal adenopathy is noted concerning for possible metastatic disease or malignancy. Clinical correlation is recommended. Mild emphysematous disease is noted in the upper lobes bilaterally. Electronically Signed   By: Marijo Conception, M.D.   On: 12/16/2015 15:46   Dg Chest Port 1 View  Result Date: 12/17/2015 CLINICAL DATA:  Shortness of breath. EXAM: PORTABLE CHEST 1 VIEW COMPARISON:  CT chest and chest radiograph 12/16/2015. FINDINGS: Patient is rotated. Heart size is accentuated by AP semi upright technique and low lung volumes. Thoracic aorta is calcified. Subcarinal mass, as on yesterday's CT exam. Bibasilar subsegmental atelectasis. Lungs are otherwise clear. No pleural fluid. Moderate hiatal hernia. IMPRESSION: 1. Low lung volumes with bibasilar atelectasis. 2. Subcarinal mass, better seen on yesterday's CT chest. 3.  Aortic atherosclerosis (ICD10-170.0). Electronically  Signed   By: Lorin Picket M.D.   On: 12/17/2015 08:56   Dg Chest Portable 1 View  Result Date: 12/16/2015 CLINICAL DATA:  Shortness of breath, chest pain for 1 week. EXAM: PORTABLE CHEST 1 VIEW COMPARISON:  12/09/2015 FINDINGS: Cardiomegaly. There is hyperinflation of the lungs compatible with COPD. Linear scarring or atelectasis in the lung bases. Suspect small effusions. No acute bony abnormality. IMPRESSION: Linear bibasilar atelectasis or scarring.  Suspect small effusions. Mild cardiomegaly.  COPD. Electronically Signed   By: Rolm Baptise M.D.   On: 12/16/2015 14:02   SIGNIFICANT EVENTS  10/24  Admit with SOB, found to have a mediastinal mass, LAN   STUDIES:  CT Chest 10/24 >> negative for PE but showed a 3.5 x 2.8 cm sub carinal mass, mediastinal adenopathy and mild emphysematous disease in the upper lobes bilaterally.    ASSESSMENT / PLAN:  Acute hypoxemic/hypercarbic respiratory failure secondary to COPD(GOLD III, O2 dependent) with questionable exacerbation and more likely pulmonary edema related to CHF. Also some abdominal distension and nausea. Plan: -Supplemental O2 to target SpO2 90-94% -Defer BiPAP for now -Transfer SDU -Solumedrol 40mg  q 6  hours -Scheduled Duoneb and PRN albuterol -Echocardiogram -Repeat troponin -Strict I&O  -Diuresis limited due to borderline low BP -Low threshold to repeat ABG and BiPAP if declines. -KUB   Mediastinal Mass with associated LAN - worrisome for malignancy   Plan: -Could be candidate need EBUS for tissue evaluation / sampling once clinically stabilized. -Will need ONC evaluation pending biopsy results    Georgann Housekeeper, AGACNP-BC [facility] Pulmonology/Critical Care Pager [phone] or [phone]  12/17/2015 4:14 PM

## 2015-12-17 NOTE — Progress Notes (Signed)
PROGRESS NOTE    Olivia [name]  MRN:[id number] DOB: 09-06-1931 DOA: 12/16/2015 PCP: Binnie Rail, MD  Brief Narrative:  Olivia Olivia [name] is a 82 y.o. female with a past medical history significant for COPD GOLD III, FEV1 47%, hypothyroidism, anxiety, and HTN who presents with significant worsening dyspnea over months and now chest pain. She was found to have a Mediastinal Mass on CT scan.   Assessment & Plan:   Principal Problem:   Chest pain Active Problems:   Essential hypertension   COPD GOLD III with atypical upper airway features    Hypothyroidism   Dyspnea   Chronic respiratory failure with hypoxia (HCC)   Mediastinal mass   1. Dyspnea and Acute on Chronic Hypercarbic Hypoxic Respiratory likely 2/2 to COPD Exacerbation:  -Dyspnea out of proportion to CT imaging and SpO2 and labs. -The patient has had a marked increase in dyspnea suddenly since July.  On CT yesterday, she has no PE, no pleural or pericardial effusion, no pneumonia.   -The mediastinal mass is not substantial enough to appear to cause respiratory distress.  -Recent echocardiogram with normal EF of 55-60% but did have Diastolic Dysfunction (grade 1).   -Inhalers D/C'd by PCCM -Patient placed on DuoNebs q6h and Albuterol Nebs q2hprn -IV Methylprednisolone 40 mg q6h per Pulmonology -Patient to be Transferred to SDU per PCCM -Repeat CXR in AM and ABG -CXR this AM showed Patient is rotated. Heart size is accentuated by AP semi upright technique and low lung volumes. Thoracic aorta is calcified. Subcarinal mass, as on yesterday's CT exam. Bibasilar subsegmental atelectasis. Lungs are otherwise clear. No pleural fluid. Moderate hiatal hernia. -Pulmonary Deferring NIPPV at this time -Patient being transferred to SDU because of tenuous respiratory status.  -Appreciate Pulmonary Consultation and additional recommendations.   2. Chest discomfort r/o ACS  -PE has been ruled out, as has dissection.   -Unclear that this  is not an anginal equivalent (shortness of breath and nausea).   -Serial enzymes overnight showed <0.03 x3; Repeat Enzymes ordered by PCCM -Transthoracic Echo pending; Low Threshold to consult Cardiology if not improving -Repeat EKG in AM -Continue ASA -Continues to be Dyspnec   3. Hyponatremia -Unclear Etiology -Patient appeared slightly dry on and Sodium dropped from 129 -> 123 -Normal Saline at a Rate of 75 mL/hr and 500 mL Bolus -Check Urine and Sodium Osmols   4. Hypomagnesemia -Patients Mag was 1.4 -Replete -Repeate Mag Level in AM  5. COPD with chronic respiratory failure and Underlying Emphyesmea:  -FEV1 47% most recently.  On O2 since this summer -Treatment as in #1  6. HTN currently running Hypotensive:  -Bolus NS 500 mL. Maintenance 75 mL/hr -Hold chlorthalidone and Metoprolol -Continue aspirin  7. Hypothyroidism:  -Check TSH -Continue levothyroxine 25 mcg po Before Breakfast  8. Mediastinal mass ? Malignancy:  -Biopsy currently is being planned as outpatient. -Will need PET/CT Imaging as an outpatient  9. Leukopenia and thrombocytopenia: -Improving. WBC went from 3.6 -> 3.8 and Platelets went from 145 -> 151 -Unclear etiology ? sepsis  10. GERD -Continue H2RA, PPI  11. Anxiety -Continue Paxil  DVT prophylaxis: Lovenox Code Status: Full Family Communication: No Family at Beside Disposition Plan: Possible SNF vs. Home with Home Health  Consultants:   PCCM/Pulmonary  Procedures: None  Antimicrobials: None  Subjective: Patient seen and examined this AM and stated she felt SOB still. Had some Nausea overnight but did not complain of any this Am. Stated her SOB has gotten worse these  last few months. Denied Lightheadedness, Dizziness. When asked about CP she denied any. No other complaints or concerns as patient wearing O2 via [place]. Per nurse patient did not rest all night and was restless all night.   Objective: Vitals:   12/17/15 0500 12/17/15  0842 12/17/15 1520 12/17/15 1924  BP: (!) 126/58  (!) 99/51   Pulse: 67     Resp: 20  (!) 30   Temp: 97.7 F (36.5 C)  98.6 F (37 C)   TempSrc: Oral  Oral   SpO2: 93% 97% (!) 89% 91%  Weight:      Height:        Intake/Output Summary (Last 24 hours) at 12/17/15 1940 Last data filed at 12/16/15 2009  Gross per 24 hour  Intake              999 ml  Output                0 ml  Net              999 ml   Filed Weights   12/17/15 0100  Weight: 64.6 kg (142 lb 8 oz)    Examination: Physical Exam:  Constitutional: Mild distress with increased work of breathing Eyes: Lids and conjunctivae normal, sclerae anicteric  ENMT: External Ears, Nose appear normal. Grossly normal hearing.  Neck: Appears normal, supple, no cervical masses, normal ROM, no appreciable thyromegaly, No JVD.  Respiratory: Diminished with crackles and some wheezing. No rhonchi. Increased respiratory effort and patient is slightly tachypenic. No accessory muscle use.  Cardiovascular: RRR, no murmurs / rubs / gallops. S1 and S2 auscultated. No extremity edema. 2+ pedal pulses.  Abdomen: Soft, non-tender, distended 2/2 body habitus. No masses palpated. No appreciable hepatosplenomegaly. Bowel sounds positive.  GU: Deferred. Musculoskeletal: No clubbing / cyanosis of digits/nails. No joint deformity upper and lower extremities.  Skin: No rashes, lesions, ulcers. No induration; Warm and dry.  Neurologic: Patient somnolent but arousable. CN 2-12 grossly intact with no focal deficits. Sensation intact in all 4 Extremities. Strength 5/5 in all 4. Romberg sign cerebellar reflexes not assessed.  Psychiatric: Normal judgment and insight. Alert and oriented x 3. Anxious mood and appropriate affect.   Data Reviewed: I have personally reviewed following labs and imaging studies  CBC:  Recent Labs Lab 12/16/15 1320 12/17/15 1519  WBC 3.6* 3.8*  NEUTROABS  --  2.6  HGB 12.3 12.8  HCT 36.1 36.8  MCV 91.4 90.6  PLT 145*  [id number]   Basic Metabolic Panel:  Recent Labs Lab 12/16/15 1320 12/17/15 1519  NA 129* 123*  K 3.6 3.9  CL 93* 85*  CO2 29 31  GLUCOSE 92 96  BUN 16 12  CREATININE 0.89 0.80  CALCIUM 9.0 8.8*  MG  --  1.4*  PHOS  --  3.2   GFR: Estimated Creatinine Clearance: 46.6 mL/min (by C-G formula based on SCr of 0.8 mg/dL). Liver Function Tests:  Recent Labs Lab 12/17/15 1519  AST 67*  ALT 34  ALKPHOS 70  BILITOT 0.8  PROT 6.4*  ALBUMIN 3.1*   No results for input(s): LIPASE, AMYLASE in the last 168 hours. No results for input(s): AMMONIA in the last 168 hours. Coagulation Profile: No results for input(s): INR, PROTIME in the last 168 hours. Cardiac Enzymes:  Recent Labs Lab 12/17/15 0015 12/17/15 0630 12/17/15 0833 12/17/15 1519  TROPONINI <0.03 <0.03 <0.03 <0.03   BNP (last 3 results)  Recent Labs  09/17/15 1551  12/09/15 1735  PROBNP 119.0* 31.0   HbA1C: No results for input(s): HGBA1C in the last 72 hours. CBG: No results for input(s): GLUCAP in the last 168 hours. Lipid Profile: No results for input(s): CHOL, HDL, LDLCALC, TRIG, CHOLHDL, LDLDIRECT in the last 72 hours. Thyroid Function Tests: No results for input(s): TSH, T4TOTAL, FREET4, T3FREE, THYROIDAB in the last 72 hours. Anemia Panel: No results for input(s): VITAMINB12, FOLATE, FERRITIN, TIBC, IRON, RETICCTPCT in the last 72 hours. Sepsis Labs:  Recent Labs Lab 12/17/15 0015  PROCALCITON <0.10    No results found for this or any previous visit (from the past 240 hour(s)).   Radiology Studies: Ct Angio Chest Pe W And/or Wo Contrast  Result Date: 12/16/2015 CLINICAL DATA:  Shortness of breath.  Chest pain. EXAM: CT ANGIOGRAPHY CHEST WITH CONTRAST TECHNIQUE: Multidetector CT imaging of the chest was performed using the standard protocol during bolus administration of intravenous contrast. Multiplanar CT image reconstructions and MIPs were obtained to evaluate the vascular anatomy. CONTRAST:   60 mL of Isovue 370 intravenously. COMPARISON:  Radiographs of December 16, 2015. CT scan of July 29, 2007. FINDINGS: Cardiovascular: Atherosclerosis of thoracic aorta is noted without aneurysm. There is no evidence of pulmonary embolus. Coronary artery calcifications are noted. Mediastinum/Nodes: 3.5 x 2.8 cm sub carinal mass is noted. 18 mm precarinal lymph node is noted. Lungs/Pleura: No pneumothorax or significant pleural effusion is noted. Mild bibasilar subsegmental atelectasis is noted. Mild emphysematous disease is noted in the upper lobes bilaterally. Upper Abdomen: Visualized portion of upper abdomen is unremarkable. Musculoskeletal: No significant osseous abnormality is noted. Review of the MIP images confirms the above findings. IMPRESSION: Aortic atherosclerosis. Coronary artery calcifications are noted suggesting coronary artery disease. No definite evidence of pulmonary embolus. Mediastinal adenopathy is noted concerning for possible metastatic disease or malignancy. Clinical correlation is recommended. Mild emphysematous disease is noted in the upper lobes bilaterally. Electronically Signed   By: Marijo Conception, M.D.   On: 12/16/2015 15:46   Dg Chest Port 1 View  Result Date: 12/17/2015 CLINICAL DATA:  Shortness of breath. EXAM: PORTABLE CHEST 1 VIEW COMPARISON:  CT chest and chest radiograph 12/16/2015. FINDINGS: Patient is rotated. Heart size is accentuated by AP semi upright technique and low lung volumes. Thoracic aorta is calcified. Subcarinal mass, as on yesterday's CT exam. Bibasilar subsegmental atelectasis. Lungs are otherwise clear. No pleural fluid. Moderate hiatal hernia. IMPRESSION: 1. Low lung volumes with bibasilar atelectasis. 2. Subcarinal mass, better seen on yesterday's CT chest. 3.  Aortic atherosclerosis (ICD10-170.0). Electronically Signed   By: Lorin Picket M.D.   On: 12/17/2015 08:56   Dg Chest Portable 1 View  Result Date: 12/16/2015 CLINICAL DATA:  Shortness of  breath, chest pain for 1 week. EXAM: PORTABLE CHEST 1 VIEW COMPARISON:  12/09/2015 FINDINGS: Cardiomegaly. There is hyperinflation of the lungs compatible with COPD. Linear scarring or atelectasis in the lung bases. Suspect small effusions. No acute bony abnormality. IMPRESSION: Linear bibasilar atelectasis or scarring.  Suspect small effusions. Mild cardiomegaly.  COPD. Electronically Signed   By: Rolm Baptise M.D.   On: 12/16/2015 14:02    Scheduled Meds: . aspirin  81 mg Oral Daily  . aspirin EC  325 mg Oral Once  . enoxaparin (LOVENOX) injection  40 mg Subcutaneous Q24H  . famotidine  10 mg Oral QHS  . ipratropium-albuterol  3 mL Nebulization Q6H  . levothyroxine  25 mcg Oral QAC breakfast  . methylPREDNISolone (SOLU-MEDROL) injection  40 mg Intravenous Q6H  .  pantoprazole  40 mg Oral QAC breakfast  . PARoxetine  20 mg Oral Daily   Continuous Infusions:    LOS: 1 day    Kerney Elbe, DO [facility] Pager [phone]  If 7PM-7AM, please contact night-coverage [email or web address] Password [email or web address] 12/17/2015, 7:40 PM

## 2015-12-18 ENCOUNTER — Observation Stay (HOSPITAL_COMMUNITY): Payer: Medicare Other

## 2015-12-18 DIAGNOSIS — Z66 Do not resuscitate: Secondary | ICD-10-CM | POA: Diagnosis not present

## 2015-12-18 DIAGNOSIS — I5033 Acute on chronic diastolic (congestive) heart failure: Secondary | ICD-10-CM | POA: Diagnosis present

## 2015-12-18 DIAGNOSIS — R918 Other nonspecific abnormal finding of lung field: Secondary | ICD-10-CM | POA: Diagnosis not present

## 2015-12-18 DIAGNOSIS — E871 Hypo-osmolality and hyponatremia: Secondary | ICD-10-CM | POA: Diagnosis not present

## 2015-12-18 DIAGNOSIS — I110 Hypertensive heart disease with heart failure: Secondary | ICD-10-CM | POA: Diagnosis present

## 2015-12-18 DIAGNOSIS — R222 Localized swelling, mass and lump, trunk: Secondary | ICD-10-CM | POA: Diagnosis present

## 2015-12-18 DIAGNOSIS — Z9981 Dependence on supplemental oxygen: Secondary | ICD-10-CM | POA: Diagnosis not present

## 2015-12-18 DIAGNOSIS — Z515 Encounter for palliative care: Secondary | ICD-10-CM | POA: Diagnosis not present

## 2015-12-18 DIAGNOSIS — E878 Other disorders of electrolyte and fluid balance, not elsewhere classified: Secondary | ICD-10-CM | POA: Diagnosis present

## 2015-12-18 DIAGNOSIS — D72819 Decreased white blood cell count, unspecified: Secondary | ICD-10-CM | POA: Diagnosis present

## 2015-12-18 DIAGNOSIS — E876 Hypokalemia: Secondary | ICD-10-CM | POA: Diagnosis not present

## 2015-12-18 DIAGNOSIS — R0602 Shortness of breath: Secondary | ICD-10-CM

## 2015-12-18 DIAGNOSIS — J9611 Chronic respiratory failure with hypoxia: Secondary | ICD-10-CM | POA: Diagnosis not present

## 2015-12-18 DIAGNOSIS — R079 Chest pain, unspecified: Secondary | ICD-10-CM | POA: Diagnosis not present

## 2015-12-18 DIAGNOSIS — M5416 Radiculopathy, lumbar region: Secondary | ICD-10-CM | POA: Diagnosis present

## 2015-12-18 DIAGNOSIS — D696 Thrombocytopenia, unspecified: Secondary | ICD-10-CM | POA: Diagnosis present

## 2015-12-18 DIAGNOSIS — R0603 Acute respiratory distress: Secondary | ICD-10-CM | POA: Diagnosis not present

## 2015-12-18 DIAGNOSIS — Z87440 Personal history of urinary (tract) infections: Secondary | ICD-10-CM | POA: Diagnosis not present

## 2015-12-18 DIAGNOSIS — E038 Other specified hypothyroidism: Secondary | ICD-10-CM | POA: Diagnosis not present

## 2015-12-18 DIAGNOSIS — E785 Hyperlipidemia, unspecified: Secondary | ICD-10-CM | POA: Diagnosis present

## 2015-12-18 DIAGNOSIS — I10 Essential (primary) hypertension: Secondary | ICD-10-CM | POA: Diagnosis not present

## 2015-12-18 DIAGNOSIS — J449 Chronic obstructive pulmonary disease, unspecified: Secondary | ICD-10-CM | POA: Diagnosis not present

## 2015-12-18 DIAGNOSIS — Z87891 Personal history of nicotine dependence: Secondary | ICD-10-CM | POA: Diagnosis not present

## 2015-12-18 DIAGNOSIS — E222 Syndrome of inappropriate secretion of antidiuretic hormone: Secondary | ICD-10-CM | POA: Diagnosis not present

## 2015-12-18 DIAGNOSIS — J9621 Acute and chronic respiratory failure with hypoxia: Secondary | ICD-10-CM | POA: Diagnosis present

## 2015-12-18 DIAGNOSIS — J9859 Other diseases of mediastinum, not elsewhere classified: Secondary | ICD-10-CM | POA: Diagnosis not present

## 2015-12-18 DIAGNOSIS — E039 Hypothyroidism, unspecified: Secondary | ICD-10-CM | POA: Diagnosis present

## 2015-12-18 DIAGNOSIS — Z7982 Long term (current) use of aspirin: Secondary | ICD-10-CM | POA: Diagnosis not present

## 2015-12-18 DIAGNOSIS — F419 Anxiety disorder, unspecified: Secondary | ICD-10-CM | POA: Diagnosis present

## 2015-12-18 DIAGNOSIS — J9622 Acute and chronic respiratory failure with hypercapnia: Secondary | ICD-10-CM | POA: Diagnosis present

## 2015-12-18 DIAGNOSIS — R0902 Hypoxemia: Secondary | ICD-10-CM

## 2015-12-18 DIAGNOSIS — K219 Gastro-esophageal reflux disease without esophagitis: Secondary | ICD-10-CM | POA: Diagnosis present

## 2015-12-18 DIAGNOSIS — R0600 Dyspnea, unspecified: Secondary | ICD-10-CM | POA: Diagnosis not present

## 2015-12-18 DIAGNOSIS — J441 Chronic obstructive pulmonary disease with (acute) exacerbation: Secondary | ICD-10-CM | POA: Diagnosis present

## 2015-12-18 DIAGNOSIS — I951 Orthostatic hypotension: Secondary | ICD-10-CM | POA: Diagnosis not present

## 2015-12-18 LAB — COMPREHENSIVE METABOLIC PANEL
ALK PHOS: 74 U/L (ref 38–126)
ALT: 35 U/L (ref 14–54)
AST: 66 U/L — ABNORMAL HIGH (ref 15–41)
Albumin: 3.1 g/dL — ABNORMAL LOW (ref 3.5–5.0)
Anion gap: 9 (ref 5–15)
BILIRUBIN TOTAL: 0.8 mg/dL (ref 0.3–1.2)
BUN: 12 mg/dL (ref 6–20)
CALCIUM: 8.5 mg/dL — AB (ref 8.9–10.3)
CO2: 28 mmol/L (ref 22–32)
CREATININE: 0.8 mg/dL (ref 0.44–1.00)
Chloride: 85 mmol/L — ABNORMAL LOW (ref 101–111)
GFR calc non Af Amer: >60 mL/min (ref >60)
Glucose, Bld: 144 mg/dL — ABNORMAL HIGH (ref 65–99)
Potassium: 3.5 mmol/L (ref 3.5–5.1)
SODIUM: 122 mmol/L — AB (ref 135–145)
Total Protein: 6.4 g/dL — ABNORMAL LOW (ref 6.5–8.1)

## 2015-12-18 LAB — RENAL FUNCTION PANEL
ANION GAP: 10 (ref 5–15)
Albumin: 3 g/dL — ABNORMAL LOW (ref 3.5–5.0)
BUN: 11 mg/dL (ref 6–20)
CHLORIDE: 84 mmol/L — AB (ref 101–111)
CO2: 28 mmol/L (ref 22–32)
Calcium: 8.6 mg/dL — ABNORMAL LOW (ref 8.9–10.3)
Creatinine, Ser: 0.76 mg/dL (ref 0.44–1.00)
GFR calc non Af Amer: >60 mL/min (ref >60)
GLUCOSE: 143 mg/dL — AB (ref 65–99)
Phosphorus: 3.2 mg/dL (ref 2.5–4.6)
Potassium: 3.8 mmol/L (ref 3.5–5.1)
Sodium: 122 mmol/L — ABNORMAL LOW (ref 135–145)

## 2015-12-18 LAB — TSH: TSH: 0.47 u[IU]/mL (ref 0.350–4.500)

## 2015-12-18 LAB — CBC WITH DIFFERENTIAL/PLATELET
Basophils Absolute: 0 10*3/uL (ref 0.0–0.1)
Basophils Relative: 0 %
Eosinophils Absolute: 0 10*3/uL (ref 0.0–0.7)
Eosinophils Relative: 0 %
HEMATOCRIT: 37 % (ref 36.0–46.0)
HEMOGLOBIN: 12.9 g/dL (ref 12.0–15.0)
LYMPHS ABS: 0.4 10*3/uL — AB (ref 0.7–4.0)
LYMPHS PCT: 22 %
MCH: 31.5 pg (ref 26.0–34.0)
MCHC: 34.9 g/dL (ref 30.0–36.0)
MCV: 90.5 fL (ref 78.0–100.0)
Monocytes Absolute: 0 10*3/uL — ABNORMAL LOW (ref 0.1–1.0)
Monocytes Relative: 1 %
NEUTROS ABS: 1.5 10*3/uL — AB (ref 1.7–7.7)
NEUTROS PCT: 77 %
Platelets: 146 10*3/uL — ABNORMAL LOW (ref 150–400)
RBC: 4.09 MIL/uL (ref 3.87–5.11)
RDW: 13.7 % (ref 11.5–15.5)
WBC: 1.9 10*3/uL — AB (ref 4.0–10.5)

## 2015-12-18 LAB — OSMOLALITY, URINE: Osmolality, Ur: 302 mOsm/kg (ref 300–900)

## 2015-12-18 LAB — BLOOD GAS, ARTERIAL
ACID-BASE EXCESS: 3.7 mmol/L — AB (ref 0.0–2.0)
BICARBONATE: 28.9 mmol/L — AB (ref 20.0–28.0)
Drawn by: 44135
O2 CONTENT: 6 L/min
O2 Saturation: 94.5 %
PCO2 ART: 53.6 mmHg — AB (ref 32.0–48.0)
PH ART: 7.35 (ref 7.350–7.450)
Patient temperature: 98.6
pO2, Arterial: 78 mmHg — ABNORMAL LOW (ref 83.0–108.0)

## 2015-12-18 LAB — TROPONIN I

## 2015-12-18 LAB — PROCALCITONIN: Procalcitonin: 0.11 ng/mL

## 2015-12-18 LAB — MRSA PCR SCREENING: MRSA by PCR: NEGATIVE

## 2015-12-18 LAB — SODIUM: Sodium: 121 mmol/L — ABNORMAL LOW (ref 135–145)

## 2015-12-18 LAB — PHOSPHORUS: Phosphorus: 3.3 mg/dL (ref 2.5–4.6)

## 2015-12-18 LAB — MAGNESIUM: Magnesium: 2.1 mg/dL (ref 1.7–2.4)

## 2015-12-18 LAB — T4, FREE: Free T4: 0.89 ng/dL (ref 0.61–1.12)

## 2015-12-18 MED ORDER — FUROSEMIDE 10 MG/ML IJ SOLN
40.0000 mg | Freq: Once | INTRAMUSCULAR | Status: AC
  Administered 2015-12-18: 40 mg via INTRAVENOUS
  Filled 2015-12-18: qty 4

## 2015-12-18 MED ORDER — METHYLPREDNISOLONE SODIUM SUCC 40 MG IJ SOLR
40.0000 mg | Freq: Three times a day (TID) | INTRAMUSCULAR | Status: DC
  Administered 2015-12-18 – 2015-12-22 (×11): 40 mg via INTRAVENOUS
  Filled 2015-12-18 (×11): qty 1

## 2015-12-18 NOTE — Progress Notes (Signed)
Name: Olivia [name] MRN: [id number] DOB: Jul 28, 1931    ADMISSION DATE:  12/16/2015 CONSULTATION DATE:  12/16/15  REFERRING MD :  Dr. Alfredia Ferguson  CHIEF COMPLAINT:  SOB    HISTORY OF PRESENT ILLNESS:  82 y/o F, former smoker (quit 2009),  with PMH of anxiety, GERD, recurrent UTI's, HTN, HLD, lumbar radiculopathy, myopathy, osteoarthritis, hypothyroidism and 2L O2 dependent GOLD III COPD (spirometry 01/2012 with an FEV1 of 1.01 / 48% predicted) presented to [facility] on 10/24 with several months of progressive shortness of breath.    She was seen in the pulmonary office on 10/24 with the above complaints.  The patient reported several months of progressive shortness of breath & "squeezing" sensation in her chest.  Symptoms were relieved with sitting up and leaning forward.  She was referred (reluctantly) to the ER for evaluation.  Initial evaluation included an EKG which did not show ischemic changes.  She developed orthostatic hypotension with SBPs in the 50's upon standing.  She was treated with IVF's with improvement.  O2 needs increased in ER with a clear CXR.  Labs - Na 129, K 3.6, Cl 93, BUN 16, Sr Cr 0.89, glucose 92, troponin 0.0, BNP 57, PCT < 0.10, WBC 3.6, Hgb 12.3 and platelets 145.  Initial CXR showed linear bibasilar atelectasis or scarring, small effusions, mild cardiomegaly. Given O2 needs the patient was evaluated with a CTA of the chest which was negative for PE but showed a 3.5 x 2.8 cm sub carinal mass, mediastinal adenopathy and mild emphysematous disease in the upper lobes bilaterally.   PCCM consulted for respiratory distress.   SUBJECTIVE:   Feeling a bit better.  Still c/o mild chest discomfort, mild SOB but better at rest.   VITAL SIGNS: Temp:  [97.4 F (36.3 C)-98.6 F (37 C)] 98.4 F (36.9 C) (10/26 1244) Pulse Rate:  [77-98] 78 (10/26 1244) Resp:  [16-30] 20 (10/26 1244) BP: (97-177)/(51-95) 103/66 (10/26 1244) SpO2:  [88 %-99 %] 93 % (10/26 1417) Weight:  [66.3 kg (146  lb 2.6 oz)-68.2 kg (150 lb 5.7 oz)] 66.3 kg (146 lb 2.6 oz) (10/26 0500)  PHYSICAL EXAMINATION: General:  Frail elderly female in NAD Neuro:  Alert, oriented to self and place. Somewhat confused and slow to respond. Struggles word finding. MAE, gen weakness  HEENT:  [place]/AT, PERRL, no JVD Cardiovascular:  RRR, no MRG. No edema Lungs:  resps even non labored on [place], speaks in full sentences, Poor air movement Abdomen:  Soft, non-tender, non-distended Musculoskeletal:  No acute deformity. Skin:  Grossly intact   Recent Labs Lab 12/16/15 1320 12/17/15 1519 12/17/15 2340 12/18/15 0348  NA 129* 123* 121* 122*  122*  K 3.6 3.9  --  3.5  3.8  CL 93* 85*  --  85*  84*  CO2 29 31  --  28  28  BUN 16 12  --  12  11  CREATININE 0.89 0.80  --  0.80  0.76  GLUCOSE 92 96  --  144*  143*    Recent Labs Lab 12/16/15 1320 12/17/15 1519 12/18/15 0348  HGB 12.3 12.8 12.9  HCT 36.1 36.8 37.0  WBC 3.6* 3.8* 1.9*  PLT 145* 151 146*   Ct Angio Chest Pe W And/or Wo Contrast  Result Date: 12/16/2015 CLINICAL DATA:  Shortness of breath.  Chest pain. EXAM: CT ANGIOGRAPHY CHEST WITH CONTRAST TECHNIQUE: Multidetector CT imaging of the chest was performed using the standard protocol during bolus administration of intravenous contrast. Multiplanar  CT image reconstructions and MIPs were obtained to evaluate the vascular anatomy. CONTRAST:  60 mL of Isovue 370 intravenously. COMPARISON:  Radiographs of December 16, 2015. CT scan of July 29, 2007. FINDINGS: Cardiovascular: Atherosclerosis of thoracic aorta is noted without aneurysm. There is no evidence of pulmonary embolus. Coronary artery calcifications are noted. Mediastinum/Nodes: 3.5 x 2.8 cm sub carinal mass is noted. 18 mm precarinal lymph node is noted. Lungs/Pleura: No pneumothorax or significant pleural effusion is noted. Mild bibasilar subsegmental atelectasis is noted. Mild emphysematous disease is noted in the upper lobes bilaterally. Upper  Abdomen: Visualized portion of upper abdomen is unremarkable. Musculoskeletal: No significant osseous abnormality is noted. Review of the MIP images confirms the above findings. IMPRESSION: Aortic atherosclerosis. Coronary artery calcifications are noted suggesting coronary artery disease. No definite evidence of pulmonary embolus. Mediastinal adenopathy is noted concerning for possible metastatic disease or malignancy. Clinical correlation is recommended. Mild emphysematous disease is noted in the upper lobes bilaterally. Electronically Signed   By: Marijo Conception, M.D.   On: 12/16/2015 15:46   Dg Chest Port 1 View  Result Date: 12/18/2015 CLINICAL DATA:  Shortness of breath. EXAM: PORTABLE CHEST 1 VIEW COMPARISON:  12/17/2015 FINDINGS: The cardiac silhouette is borderline enlarged. Thoracic aortic atherosclerosis is again noted. Subcarinal lymphadenopathy/mass is better seen on recent CT. Less gaseous distension of hiatal hernia. Lung volumes remain diminished with unchanged bibasilar opacities. No sizable pleural effusion or pneumothorax is identified. IMPRESSION: 1. Low lung volumes with unchanged bibasilar atelectasis. 2. Subcarinal lymphadenopathy/mass as demonstrated on recent CT. 3. Aortic atherosclerosis. Electronically Signed   By: Logan Bores M.D.   On: 12/18/2015 08:37   Dg Chest Port 1 View  Result Date: 12/17/2015 CLINICAL DATA:  Shortness of breath. EXAM: PORTABLE CHEST 1 VIEW COMPARISON:  CT chest and chest radiograph 12/16/2015. FINDINGS: Patient is rotated. Heart size is accentuated by AP semi upright technique and low lung volumes. Thoracic aorta is calcified. Subcarinal mass, as on yesterday's CT exam. Bibasilar subsegmental atelectasis. Lungs are otherwise clear. No pleural fluid. Moderate hiatal hernia. IMPRESSION: 1. Low lung volumes with bibasilar atelectasis. 2. Subcarinal mass, better seen on yesterday's CT chest. 3.  Aortic atherosclerosis (ICD10-170.0). Electronically Signed    By: Lorin Picket M.D.   On: 12/17/2015 08:56   Dg Abd Portable 1v  Result Date: 12/17/2015 CLINICAL DATA:  Left-sided abdominal pain and nausea for the past 2 days. EXAM: PORTABLE ABDOMEN - 1 VIEW COMPARISON:  Chest CT from 1 day prior, same day chest radiograph l. FINDINGS: The bowel gas pattern is nonspecific without free air or bowel obstruction noted. There are streaky parenchymal airspace opacities at the lung bases consistent with bibasilar atelectasis. No effusion is identified. There is no free air. There is mild dextroconvex scoliosis of the upper lumbar spine at L2. There is lumbar spondylosis and multilevel lumbar facet arthropathy. No acute osseous abnormality. IMPRESSION EDIT: IMPRESSION EDIT No bowel obstruction or free air. Electronically Signed   By: Ashley Royalty M.D.   On: 12/17/2015 20:01   SIGNIFICANT EVENTS  10/24  Admit with SOB, found to have a mediastinal mass, LAN   STUDIES:  CT Chest 10/24 >> negative for PE but showed a 3.5 x 2.8 cm sub carinal mass, mediastinal adenopathy and mild emphysematous disease in the upper lobes bilaterally.    ASSESSMENT / PLAN:  Acute hypoxemic/hypercarbic respiratory failure secondary to COPD(GOLD III, O2 dependent) with questionable exacerbation and more likely pulmonary edema related to CHF. Also some abdominal distension  and nausea. Plan: -Supplemental O2 to target SpO2 90-94% -Defer BiPAP for now -will add gentle diuresis now that BP improved, should also help hyponatremia  -Solumedrol 40mg  q8 hours -Scheduled Duoneb and PRN albuterol -Echocardiogram pending  -Repeat troponin negative  -Strict I&O   Mediastinal Mass with associated LAN - worrisome for malignancy  Plan: Patient will need outpatient workup with PET/CT imaging and further biopsy depending upon this result pending stabilization of her respiratory status.  Nickolas Madrid, NP 12/18/2015  2:51 PM Pager: [phone] or [phone]  Attending Note:  82  year old female with mediastinal mass with LAN and hyponatremia.  PCCM called for hypercarbia and hypoxemia due to COPD and pulmonary edema.  On exam, BP is much improved now and lungs with decreased BS at the bases.  I reviewed chest CT myself, mediastinal LAN noted.  Discussed with PCCM-NP.  Acute on chronic respiratory failure:  - Titrate O2 for sat of 88-92%  - Monitor.  Acute pulmonary edema  - Lasix ordered since BP improved.  - Treat heart failure  - Strict I/O.  COPD:  - Albuterol  - Bronchodilators as ordered.  Lung mass:  - Would not recommend any aggressive action at this time.  - Need to discuss code status.  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  - Renal following  - Fluid restriction.  Patient seen and examined, agree with above note.  I dictated the care and orders written for this patient under my direction.  Rush Farmer, MD [phone]

## 2015-12-18 NOTE — CV Procedure (Addendum)
2D echo attempted but patient was on bedpan, try later.

## 2015-12-18 NOTE — Progress Notes (Signed)
PROGRESS NOTE    Olivia [name]  MRN:[id number] DOB: [birth year] DOA: 12/16/2015 PCP: Binnie Rail, MD  Brief Narrative:  Olivia [name] is a 82 y.o. female with a past medical history significant for COPD GOLD III, FEV1 47%, hypothyroidism, anxiety, and HTN who presents with significant worsening dyspnea over months and now chest pain. She was found to have a Mediastinal Mass on CT scan. She was transferred to SDU yesterday because of her tenuous respiratory status.   Assessment & Plan:   Principal Problem:   Chest pain Active Problems:   Essential hypertension   COPD GOLD III with atypical upper airway features    Hypothyroidism   Dyspnea   Chronic respiratory failure with hypoxia (HCC)   Mediastinal mass   1. Dyspnea and Acute on Chronic Hypercarbic Hypoxic Respiratory likely 2/2 to COPD Exacerbation:  -Dyspnea out of proportion to CT imaging and SpO2 and labs. -The patient has had a marked increase in dyspnea suddenly since July.  On CT yesterday, she has no PE, no pleural or pericardial effusion, no pneumonia.   -The mediastinal mass is not substantial enough to appear to cause respiratory distress.  -Recent echocardiogram with normal EF of 55-60% but did have Diastolic Dysfunction (grade 1). *Repeat Echo Pending*  -Inhalers D/C'd by PCCM -Patient placed on DuoNebs q6h and Albuterol Nebs q2hprn -IV Methylprednisolone 40 mg q6h per Pulmonology -Repeat Chest X-Ray showed The cardiac silhouette is borderline enlarged. Thoracic aortic atherosclerosis is again noted. Subcarinal lymphadenopathy/mass is better seen on recent CT. Less gaseous distension of hiatal hernia. Lung volumes remain diminished with unchanged bibasilar opacities. No sizable pleural effusion or pneumothorax is identified. -Pulmonary Diuresing with IV 40 mg of Lasix x 1 dose. -Strict I's and O's -Pulmonary Deferring NIPPV at this time -Patient on SDU because of tenuous respiratory status.  -Appreciate Pulmonary  Consultation and additional recommendations.   2. Chest discomfort r/o ACS  -PE has been ruled out, as has dissection.   -Unclear that this is not an anginal equivalent (shortness of breath and nausea).   -Serial enzymes overnight showed <0.03 x 6;  -Transthoracic Echo pending; Low Threshold to consult Cardiology if not improving -Repeat EKG in AM -Continue ASA  3. Hyponatremia -Appreciate Nephrology Consult and Reccomendations -Nephrology thinks its from SIADH from Mediastinal Mass  -Patient appeared slightly dry on and Sodium dropped from 129 -> 123 -> 121 -D/C'd IVF of 75 mL/hr  -PCCM gave patient Lasix IV 40 mg x 1 dose -Urine Osmol was 617 and Urine Sodium was 147  -Repeat BMP in AM -Fluid Restrict to 1000 mL/hr -Strict I's and O's  4. Hypomagnesemia, improved -Patients Mag was 2.1 -Repleted. -Repeat Mag Level in AM  5. COPD with chronic respiratory failure and Underlying Emphysema:  -FEV1 47% most recently.  On O2 since this summer -Treatment as in #1  6. HTN  -Hypotension improved -D/C Chlorthalidone Indefinitely; Hold Metoprolol -Continue aspirin  7. Hypothyroidism:  -TSH was 0.470 and Free T4 was 0.89 -Continue Levothyroxine 25 mcg po Before Breakfast  8. Mediastinal mass ? Malignancy:  -Biopsy currently is being planned as outpatient. -Will need PET/CT Imaging as an outpatient  9. Leukopenia and Thrombocytopenia: -WBC went from 3.6 -> 3.8 -> 1.9 and Platelets went from 145 -> 151 -> 146 -Unclear etiology ? Sepsis -Repeat CBC in AM  10. GERD -Continue H2RA, PPI  11. Anxiety -Discontinue Paxil as it Kaczmarczyk contribute to Hyponatremia  DVT prophylaxis: Lovenox Code Status: Full Family Communication: Discussed plan  of care with son at bedside.  Disposition Plan: Possible SNF vs. Home with Home Health once respiratory status improves  Consultants:   PCCM/Pulmonary  Nephrology  Procedures: None  Antimicrobials: None  Subjective: Patient seen  and examined this AM and she stated she did not have the chest pressure that she had but she felt SOB still and slightly dizzy. No Nausea or vomiting and was more alert to questioning. Denied any other active complaints currently.   Objective: Vitals:   12/18/15 0700 12/18/15 0906 12/18/15 1244 12/18/15 1417  BP: (!) 177/86  103/66   Pulse: 77  78   Resp: 19  20   Temp: 97.8 F (36.6 C)  98.4 F (36.9 C)   TempSrc: Oral  Oral   SpO2: 96% 97% 95% 93%  Weight:      Height:        Intake/Output Summary (Last 24 hours) at 12/18/15 1501 Last data filed at 12/18/15 1420  Gross per 24 hour  Intake          2736.25 ml  Output              900 ml  Net          1836.25 ml   Filed Weights   12/17/15 0100 12/17/15 2300 12/18/15 0500  Weight: 64.6 kg (142 lb 8 oz) 68.2 kg (150 lb 5.7 oz) 66.3 kg (146 lb 2.6 oz)    Examination: Physical Exam:  Constitutional: Thin elderly female in NAD Eyes: Lids and conjunctivae normal, sclerae anicteric  ENMT: External Ears, Nose appear normal. Grossly decreased hearing.  Neck: Appears normal, supple, no cervical masses, normal ROM, no appreciable thyromegaly, No JVD.  Respiratory: Diminished with crackles. No rhonchi. Normal respiratory effort and patient is slightly tachypenic. No accessory muscle use.  Cardiovascular: RRR, no murmurs / rubs / gallops. S1 and S2 auscultated. Very mild  extremity edema.  Abdomen: Soft, non-tender, distended 2/2 body habitus. No masses palpated. No appreciable hepatosplenomegaly. Bowel sounds positive.  GU: Deferred. Musculoskeletal: No clubbing / cyanosis of digits/nails. No joint deformity upper and lower extremities.  Skin: No rashes, lesions, ulcers. No induration; Warm and dry.  Neurologic: Patient somnolent but arousable. CN 2-12 grossly intact with no focal deficits. Sensation intact in all 4 Extremities. Romberg sign cerebellar reflexes not assessed.  Psychiatric: Normal judgment and insight. Alert and oriented  x 3. Anxious mood and appropriate affect.   Data Reviewed: I have personally reviewed following labs and imaging studies  CBC:  Recent Labs Lab 12/16/15 1320 12/17/15 1519 12/18/15 0348  WBC 3.6* 3.8* 1.9*  NEUTROABS  --  2.6 1.5*  HGB 12.3 12.8 12.9  HCT 36.1 36.8 37.0  MCV 91.4 90.6 90.5  PLT 145* 151 [id number]*   Basic Metabolic Panel:  Recent Labs Lab 12/16/15 1320 12/17/15 1519 12/17/15 2340 12/18/15 0348  NA 129* 123* 121* 122*  122*  K 3.6 3.9  --  3.5  3.8  CL 93* 85*  --  85*  84*  CO2 29 31  --  28  28  GLUCOSE 92 96  --  144*  143*  BUN 16 12  --  12  11  CREATININE 0.89 0.80  --  0.80  0.76  CALCIUM 9.0 8.8*  --  8.5*  8.6*  MG  --  1.4*  --  2.1  PHOS  --  3.2  --  3.3  3.2   GFR: Estimated Creatinine Clearance: 47.2 mL/min (by C-G formula  based on SCr of 0.76 mg/dL). Liver Function Tests:  Recent Labs Lab 12/17/15 1519 12/18/15 0348  AST 67* 66*  ALT 34 35  ALKPHOS 70 74  BILITOT 0.8 0.8  PROT 6.4* 6.4*  ALBUMIN 3.1* 3.1*  3.0*   No results for input(s): LIPASE, AMYLASE in the last 168 hours. No results for input(s): AMMONIA in the last 168 hours. Coagulation Profile: No results for input(s): INR, PROTIME in the last 168 hours. Cardiac Enzymes:  Recent Labs Lab 12/17/15 0630 12/17/15 0833 12/17/15 1519 12/17/15 2242 12/18/15 0348  TROPONINI <0.03 <0.03 <0.03 <0.03 <0.03   BNP (last 3 results)  Recent Labs  09/17/15 1551 12/09/15 1735  PROBNP 119.0* 31.0   HbA1C: No results for input(s): HGBA1C in the last 72 hours. CBG: No results for input(s): GLUCAP in the last 168 hours. Lipid Profile: No results for input(s): CHOL, HDL, LDLCALC, TRIG, CHOLHDL, LDLDIRECT in the last 72 hours. Thyroid Function Tests: No results for input(s): TSH, T4TOTAL, FREET4, T3FREE, THYROIDAB in the last 72 hours. Anemia Panel: No results for input(s): VITAMINB12, FOLATE, FERRITIN, TIBC, IRON, RETICCTPCT in the last 72 hours. Sepsis  Labs:  Recent Labs Lab 12/17/15 0015 12/18/15 0348  PROCALCITON <0.10 0.11    Recent Results (from the past 240 hour(s))  MRSA PCR Screening     Status: None   Collection Time: 12/17/15 11:10 PM  Result Value Ref Range Status   MRSA by PCR NEGATIVE NEGATIVE Final    Comment:        The GeneXpert MRSA Assay (FDA approved for NASAL specimens only), is one component of a comprehensive MRSA colonization surveillance program. It is not intended to diagnose MRSA infection nor to guide or monitor treatment for MRSA infections.      Radiology Studies: Ct Angio Chest Pe W And/or Wo Contrast  Result Date: 12/16/2015 CLINICAL DATA:  Shortness of breath.  Chest pain. EXAM: CT ANGIOGRAPHY CHEST WITH CONTRAST TECHNIQUE: Multidetector CT imaging of the chest was performed using the standard protocol during bolus administration of intravenous contrast. Multiplanar CT image reconstructions and MIPs were obtained to evaluate the vascular anatomy. CONTRAST:  60 mL of Isovue 370 intravenously. COMPARISON:  Radiographs of December 16, 2015. CT scan of July 29, 2007. FINDINGS: Cardiovascular: Atherosclerosis of thoracic aorta is noted without aneurysm. There is no evidence of pulmonary embolus. Coronary artery calcifications are noted. Mediastinum/Nodes: 3.5 x 2.8 cm sub carinal mass is noted. 18 mm precarinal lymph node is noted. Lungs/Pleura: No pneumothorax or significant pleural effusion is noted. Mild bibasilar subsegmental atelectasis is noted. Mild emphysematous disease is noted in the upper lobes bilaterally. Upper Abdomen: Visualized portion of upper abdomen is unremarkable. Musculoskeletal: No significant osseous abnormality is noted. Review of the MIP images confirms the above findings. IMPRESSION: Aortic atherosclerosis. Coronary artery calcifications are noted suggesting coronary artery disease. No definite evidence of pulmonary embolus. Mediastinal adenopathy is noted concerning for possible  metastatic disease or malignancy. Clinical correlation is recommended. Mild emphysematous disease is noted in the upper lobes bilaterally. Electronically Signed   By: Marijo Conception, M.D.   On: 12/16/2015 15:46   Dg Chest Port 1 View  Result Date: 12/18/2015 CLINICAL DATA:  Shortness of breath. EXAM: PORTABLE CHEST 1 VIEW COMPARISON:  12/17/2015 FINDINGS: The cardiac silhouette is borderline enlarged. Thoracic aortic atherosclerosis is again noted. Subcarinal lymphadenopathy/mass is better seen on recent CT. Less gaseous distension of hiatal hernia. Lung volumes remain diminished with unchanged bibasilar opacities. No sizable pleural effusion or pneumothorax  is identified. IMPRESSION: 1. Low lung volumes with unchanged bibasilar atelectasis. 2. Subcarinal lymphadenopathy/mass as demonstrated on recent CT. 3. Aortic atherosclerosis. Electronically Signed   By: Logan Bores M.D.   On: 12/18/2015 08:37   Dg Chest Port 1 View  Result Date: 12/17/2015 CLINICAL DATA:  Shortness of breath. EXAM: PORTABLE CHEST 1 VIEW COMPARISON:  CT chest and chest radiograph 12/16/2015. FINDINGS: Patient is rotated. Heart size is accentuated by AP semi upright technique and low lung volumes. Thoracic aorta is calcified. Subcarinal mass, as on yesterday's CT exam. Bibasilar subsegmental atelectasis. Lungs are otherwise clear. No pleural fluid. Moderate hiatal hernia. IMPRESSION: 1. Low lung volumes with bibasilar atelectasis. 2. Subcarinal mass, better seen on yesterday's CT chest. 3.  Aortic atherosclerosis (ICD10-170.0). Electronically Signed   By: Lorin Picket M.D.   On: 12/17/2015 08:56   Dg Abd Portable 1v  Result Date: 12/17/2015 CLINICAL DATA:  Left-sided abdominal pain and nausea for the past 2 days. EXAM: PORTABLE ABDOMEN - 1 VIEW COMPARISON:  Chest CT from 1 day prior, same day chest radiograph l. FINDINGS: The bowel gas pattern is nonspecific without free air or bowel obstruction noted. There are streaky  parenchymal airspace opacities at the lung bases consistent with bibasilar atelectasis. No effusion is identified. There is no free air. There is mild dextroconvex scoliosis of the upper lumbar spine at L2. There is lumbar spondylosis and multilevel lumbar facet arthropathy. No acute osseous abnormality. IMPRESSION EDIT: IMPRESSION EDIT No bowel obstruction or free air. Electronically Signed   By: Ashley Royalty M.D.   On: 12/17/2015 20:01    Scheduled Meds: . aspirin  81 mg Oral Daily  . aspirin EC  325 mg Oral Once  . enoxaparin (LOVENOX) injection  40 mg Subcutaneous Q24H  . famotidine  10 mg Oral QHS  . furosemide  40 mg Intravenous Once  . ipratropium-albuterol  3 mL Nebulization Q6H  . levothyroxine  25 mcg Oral QAC breakfast  . mouth rinse  15 mL Mouth Rinse BID  . methylPREDNISolone (SOLU-MEDROL) injection  40 mg Intravenous Q8H  . pantoprazole  40 mg Oral QAC breakfast  . PARoxetine  20 mg Oral Daily   Continuous Infusions:    LOS: 1 day    Kerney Elbe, DO [facility] Pager [phone]  If 7PM-7AM, please contact night-coverage [email or web address] Password [email or web address] 12/18/2015, 3:01 PM

## 2015-12-18 NOTE — Consult Note (Signed)
Olivia [name] is an 82 y.o. female referred by Dr Alfredia Ferguson   Chief Complaint: Hyponatremia HPI: 82yo female admitted 12/16/15 for SOB and chest tightness.  SNa on admission was 129.  She received IV fluids since admission and now SNa 122.  UOsm 617  and UNa 147.  UO not well recorded.  CT chest with IV contrast showed mediastinal mass.  Scr .89.  TSH  0.87.  She is on paxil and chlorthalidone is listed on admission meds.   Placed on steroids presumably for COPD flare. Denies any new meds.  No change in eating patterns at home except did not eat breakfast day of admission.  Past Medical History:  Diagnosis Date  . Anxiety   . Cancer ([place])    squamous cell rectal area  . COPD (chronic obstructive pulmonary disease) ([place])   . GERD (gastroesophageal reflux disease)    occ  . Heart murmur   . History of recurrent UTIs   . Hyperlipidemia   . Hypertension   . Hypothyroidism   . Left lumbar radiculopathy   . Myopathy   . Osteoarthritis   . Pneumonia 1/09   hx  . Shortness of breath     Past Surgical History:  Procedure Laterality Date  . CATARACT EXTRACTION, BILATERAL    . colonoscopy with polypectomy    . DILATION AND CURETTAGE OF UTERUS    . GLAUCOMA SURGERY    . MICROLARYNGOSCOPY Right 04/26/2012   Procedure: MICROLARYNGOSCOPY WITH EXCISION OF VOCAL CORD LESION;  Surgeon: Izora Gala, MD;  Location: [place];  Service: ENT;  Laterality: Right;  . squamous cell cancer  resection     rectal; Dr Morton Stall , [facility]  . uterine ablation     Dr Ubaldo Glassing    Family History  Problem Relation Age of Onset  . Arthritis Mother   . Diabetes Mother   . Heart disease Mother     MI @ 29  . Kidney disease Father   . Heart disease Father     Rheumatic Heart Disease  . Asthma Sister   . Cancer Brother     brother breast cancer, melanoma  . Stroke Maternal Grandfather     ? age  . Stroke Paternal Grandfather 15   Social History:  reports that she quit smoking about 8 years ago. Her smoking use included  Cigarettes. She has a 57.00 pack-year smoking history. She has never used smokeless tobacco. She reports that she drinks alcohol. She reports that she does not use drugs.  Allergies:  Allergies  Allergen Reactions  . Diclofenac     Hives  . Acetaminophen     Elevated LFTs  . Sulfa Antibiotics Nausea And Vomiting  . Ultram [Tramadol]     vomiting  . Fluvastatin Sodium     Lescol caused flu symptoms  . Meperidine Hcl     vomiting    Medications Prior to Admission  Medication Sig Dispense Refill  . albuterol (PROVENTIL) (2.5 MG/3ML) 0.083% nebulizer solution Take 3 mLs (2.5 mg total) by nebulization every 6 (six) hours as needed for wheezing or shortness of breath. 75 mL 12  . aspirin 81 MG tablet Take 81 mg by mouth daily. Reported on 05/28/2015    . chlorthalidone (HYGROTON) 25 MG tablet TAKE ONE-HALF TABLET BY MOUTH ONCE DAILY 45 tablet 2  . cimetidine (TAGAMET) 200 MG tablet Take 200 mg by mouth daily.    [name] docusate sodium (COLACE) 100 MG capsule Take 100 mg by mouth daily  as needed.     [name] FIBER PO Take 1 tablet by mouth daily as needed.     . Fluticasone-Salmeterol (ADVAIR DISKUS) 250-50 MCG/DOSE AEPB One puff twice daily 1 each 11  . hydroxypropyl methylcellulose (ISOPTO TEARS) 2.5 % ophthalmic solution Place 2 drops into both eyes 4 (four) times daily as needed (for dry eyes).    [name] levothyroxine (SYNTHROID, LEVOTHROID) 50 MCG tablet 1/2 tablet daily 90 tablet 3  . magic mouthwash SOLN Take 5 mLs by mouth.    . metoprolol succinate (TOPROL-XL) 25 MG 24 hr tablet 1/2 tab by mouth daily 45 tablet 3  . pantoprazole (PROTONIX) 40 MG tablet Take 1 tablet (40 mg total) by mouth daily. Take 30-60 min before first meal of the day 30 tablet 2  . PARoxetine (PAXIL) 20 MG tablet TAKE ONE TABLET BY MOUTH ONCE DAILY 90 tablet 1  . umeclidinium bromide (INCRUSE ELLIPTA) 62.5 MCG/INH AEPB Inhale 1 puff into the lungs daily. 7 each 0  . VENTOLIN HFA 108 (90 Base) MCG/ACT inhaler INHALE TWO PUFFS  BY MOUTH EVERY 6 HOURS AS NEEDED FOR WHEEZING FOR SHORTNESS OF BREATH 18 each 6  . famotidine (PEPCID) 20 MG tablet One at bedtime (Patient not taking: Reported on 12/16/2015)    . nystatin (MYCOSTATIN) 100000 UNIT/ML suspension Take 10 mLs (1,000,000 Units total) by mouth 4 (four) times daily. (Patient not taking: Reported on 12/16/2015) 120 mL 0     Lab Results: UA: ND  Recent Labs  12/16/15 1320 12/17/15 1519 12/18/15 0348  WBC 3.6* 3.8* 1.9*  HGB 12.3 12.8 12.9  HCT 36.1 36.8 37.0  PLT 145* 151 146*   BMET  Recent Labs  12/16/15 1320 12/17/15 1519 12/17/15 2340 12/18/15 0348  NA 129* 123* 121* 122*  122*  K 3.6 3.9  --  3.5  3.8  CL 93* 85*  --  85*  84*  CO2 29 31  --  28  28  GLUCOSE 92 96  --  144*  143*  BUN 16 12  --  12  11  CREATININE 0.89 0.80  --  0.80  0.76  CALCIUM 9.0 8.8*  --  8.5*  8.6*  PHOS  --  3.2  --  3.3  3.2   LFT  Recent Labs  12/18/15 0348  PROT 6.4*  ALBUMIN 3.1*  3.0*  AST 66*  ALT 35  ALKPHOS 74  BILITOT 0.8   Ct Angio Chest Pe W And/or Wo Contrast  Result Date: 12/16/2015 CLINICAL DATA:  Shortness of breath.  Chest pain. EXAM: CT ANGIOGRAPHY CHEST WITH CONTRAST TECHNIQUE: Multidetector CT imaging of the chest was performed using the standard protocol during bolus administration of intravenous contrast. Multiplanar CT image reconstructions and MIPs were obtained to evaluate the vascular anatomy. CONTRAST:  60 mL of Isovue 370 intravenously. COMPARISON:  Radiographs of December 16, 2015. CT scan of July 29, 2007. FINDINGS: Cardiovascular: Atherosclerosis of thoracic aorta is noted without aneurysm. There is no evidence of pulmonary embolus. Coronary artery calcifications are noted. Mediastinum/Nodes: 3.5 x 2.8 cm sub carinal mass is noted. 18 mm precarinal lymph node is noted. Lungs/Pleura: No pneumothorax or significant pleural effusion is noted. Mild bibasilar subsegmental atelectasis is noted. Mild emphysematous disease is  noted in the upper lobes bilaterally. Upper Abdomen: Visualized portion of upper abdomen is unremarkable. Musculoskeletal: No significant osseous abnormality is noted. Review of the MIP images confirms the above findings. IMPRESSION: Aortic atherosclerosis. Coronary artery calcifications are noted suggesting coronary artery  disease. No definite evidence of pulmonary embolus. Mediastinal adenopathy is noted concerning for possible metastatic disease or malignancy. Clinical correlation is recommended. Mild emphysematous disease is noted in the upper lobes bilaterally. Electronically Signed   By: Marijo Conception, M.D.   On: 12/16/2015 15:46   Dg Chest Port 1 View  Result Date: 12/18/2015 CLINICAL DATA:  Shortness of breath. EXAM: PORTABLE CHEST 1 VIEW COMPARISON:  12/17/2015 FINDINGS: The cardiac silhouette is borderline enlarged. Thoracic aortic atherosclerosis is again noted. Subcarinal lymphadenopathy/mass is better seen on recent CT. Less gaseous distension of hiatal hernia. Lung volumes remain diminished with unchanged bibasilar opacities. No sizable pleural effusion or pneumothorax is identified. IMPRESSION: 1. Low lung volumes with unchanged bibasilar atelectasis. 2. Subcarinal lymphadenopathy/mass as demonstrated on recent CT. 3. Aortic atherosclerosis. Electronically Signed   By: Logan Bores M.D.   On: 12/18/2015 08:37   Dg Chest Port 1 View  Result Date: 12/17/2015 CLINICAL DATA:  Shortness of breath. EXAM: PORTABLE CHEST 1 VIEW COMPARISON:  CT chest and chest radiograph 12/16/2015. FINDINGS: Patient is rotated. Heart size is accentuated by AP semi upright technique and low lung volumes. Thoracic aorta is calcified. Subcarinal mass, as on yesterday's CT exam. Bibasilar subsegmental atelectasis. Lungs are otherwise clear. No pleural fluid. Moderate hiatal hernia. IMPRESSION: 1. Low lung volumes with bibasilar atelectasis. 2. Subcarinal mass, better seen on yesterday's CT chest. 3.  Aortic  atherosclerosis (ICD10-170.0). Electronically Signed   By: Lorin Picket M.D.   On: 12/17/2015 08:56   Dg Chest Portable 1 View  Result Date: 12/16/2015 CLINICAL DATA:  Shortness of breath, chest pain for 1 week. EXAM: PORTABLE CHEST 1 VIEW COMPARISON:  12/09/2015 FINDINGS: Cardiomegaly. There is hyperinflation of the lungs compatible with COPD. Linear scarring or atelectasis in the lung bases. Suspect small effusions. No acute bony abnormality. IMPRESSION: Linear bibasilar atelectasis or scarring.  Suspect small effusions. Mild cardiomegaly.  COPD. Electronically Signed   By: Rolm Baptise M.D.   On: 12/16/2015 14:02   Dg Abd Portable 1v  Result Date: 12/17/2015 CLINICAL DATA:  Left-sided abdominal pain and nausea for the past 2 days. EXAM: PORTABLE ABDOMEN - 1 VIEW COMPARISON:  Chest CT from 1 day prior, same day chest radiograph l. FINDINGS: The bowel gas pattern is nonspecific without free air or bowel obstruction noted. There are streaky parenchymal airspace opacities at the lung bases consistent with bibasilar atelectasis. No effusion is identified. There is no free air. There is mild dextroconvex scoliosis of the upper lumbar spine at L2. There is lumbar spondylosis and multilevel lumbar facet arthropathy. No acute osseous abnormality. IMPRESSION EDIT: IMPRESSION EDIT No bowel obstruction or free air. Electronically Signed   By: Ashley Royalty M.D.   On: 12/17/2015 20:01    ROS: Appetite fair Breathing a little better No CP No ABD pain Chronic back/leg pain secondary to spinal stenosis No change in UO  PHYSICAL EXAM: Blood pressure 103/66, pulse 78, temperature 98.4 F (36.9 C), temperature source Oral, resp. rate 20, height 5\' 1"  (1.549 m), weight 66.3 kg (146 lb 2.6 oz), SpO2 95 %. HEENT: PERRLA EOMI NECK:No JVD LUNGS:Decreased BS bilaterally with bil crackles CARDIAC:RRR wo MRG ABD:+ BS NTND No HSM EXT:No CCE Pulses: 2/4=bilat NEURO:CI M&SI, Ox3  Assessment: 1. Hyponatremia  that I suspect is due to SIADH and Lira be related to mass in her chest +/- Chlorthalidone.  Less likely paxil (has been on it for 2 yrs) 2. COPD 3. Mediastinal mass PLAN: 1. DC IV fluids for  now 2. If Sna does not improve with fluid restriction then could add lasix 3. Daily SNa 4. Fluid restrict to 1000cc/d but she can have a regular diet 5. Accurate I/O 6. DC chlorthalidone from now on   Karla Pavone T 12/18/2015, 1:47 PM

## 2015-12-19 ENCOUNTER — Inpatient Hospital Stay (HOSPITAL_COMMUNITY): Payer: Medicare Other

## 2015-12-19 DIAGNOSIS — I5033 Acute on chronic diastolic (congestive) heart failure: Secondary | ICD-10-CM

## 2015-12-19 DIAGNOSIS — I10 Essential (primary) hypertension: Secondary | ICD-10-CM

## 2015-12-19 LAB — BASIC METABOLIC PANEL
ANION GAP: 11 (ref 5–15)
Anion gap: 11 (ref 5–15)
BUN: 19 mg/dL (ref 6–20)
BUN: 20 mg/dL (ref 6–20)
CALCIUM: 8.8 mg/dL — AB (ref 8.9–10.3)
CO2: 31 mmol/L (ref 22–32)
CO2: 30 mmol/L (ref 22–32)
Calcium: 8.8 mg/dL — ABNORMAL LOW (ref 8.9–10.3)
Chloride: 78 mmol/L — ABNORMAL LOW (ref 101–111)
Chloride: 78 mmol/L — ABNORMAL LOW (ref 101–111)
Creatinine, Ser: 0.85 mg/dL (ref 0.44–1.00)
Creatinine, Ser: 0.94 mg/dL (ref 0.44–1.00)
GFR calc Af Amer: >60 mL/min (ref >60)
GFR calc Af Amer: >60 mL/min (ref >60)
GFR calc non Af Amer: 55 mL/min — ABNORMAL LOW (ref >60)
GLUCOSE: 157 mg/dL — AB (ref 65–99)
GLUCOSE: 144 mg/dL — AB (ref 65–99)
POTASSIUM: 3.2 mmol/L — AB (ref 3.5–5.1)
Potassium: 3.8 mmol/L (ref 3.5–5.1)
Sodium: 120 mmol/L — ABNORMAL LOW (ref 135–145)
Sodium: 119 mmol/L — CL (ref 135–145)

## 2015-12-19 LAB — RENAL FUNCTION PANEL
Albumin: 3.4 g/dL — ABNORMAL LOW (ref 3.5–5.0)
Anion gap: 10 (ref 5–15)
BUN: 18 mg/dL (ref 6–20)
CHLORIDE: 78 mmol/L — AB (ref 101–111)
CO2: 31 mmol/L (ref 22–32)
CREATININE: 0.87 mg/dL (ref 0.44–1.00)
Calcium: 8.7 mg/dL — ABNORMAL LOW (ref 8.9–10.3)
GFR calc Af Amer: >60 mL/min (ref >60)
GFR calc non Af Amer: >60 mL/min (ref >60)
GLUCOSE: 159 mg/dL — AB (ref 65–99)
Phosphorus: 1.7 mg/dL — ABNORMAL LOW (ref 2.5–4.6)
Potassium: 3.1 mmol/L — ABNORMAL LOW (ref 3.5–5.1)
Sodium: 119 mmol/L — CL (ref 135–145)

## 2015-12-19 LAB — CBC WITH DIFFERENTIAL/PLATELET
Basophils Absolute: 0 10*3/uL (ref 0.0–0.1)
Basophils Relative: 0 %
EOS PCT: 0 %
Eosinophils Absolute: 0 10*3/uL (ref 0.0–0.7)
HCT: 35.7 % — ABNORMAL LOW (ref 36.0–46.0)
Hemoglobin: 13 g/dL (ref 12.0–15.0)
LYMPHS ABS: 0.6 10*3/uL — AB (ref 0.7–4.0)
LYMPHS PCT: 10 %
MCH: 31.7 pg (ref 26.0–34.0)
MCHC: 36.4 g/dL — ABNORMAL HIGH (ref 30.0–36.0)
MCV: 87.1 fL (ref 78.0–100.0)
MONO ABS: 0.1 10*3/uL (ref 0.1–1.0)
MONOS PCT: 2 %
Neutro Abs: 5.2 10*3/uL (ref 1.7–7.7)
Neutrophils Relative %: 88 %
PLATELETS: 162 10*3/uL (ref 150–400)
RBC: 4.1 MIL/uL (ref 3.87–5.11)
RDW: 13.5 % (ref 11.5–15.5)
WBC: 6 10*3/uL (ref 4.0–10.5)

## 2015-12-19 LAB — ECHOCARDIOGRAM COMPLETE
HEIGHTINCHES: 61 in
WEIGHTICAEL: 2306.89 [oz_av]

## 2015-12-19 LAB — URIC ACID: Uric Acid, Serum: 5.5 mg/dL (ref 2.3–6.6)

## 2015-12-19 LAB — MAGNESIUM: Magnesium: 1.7 mg/dL (ref 1.7–2.4)

## 2015-12-19 MED ORDER — METHOCARBAMOL 500 MG PO TABS
500.0000 mg | ORAL_TABLET | Freq: Four times a day (QID) | ORAL | Status: DC | PRN
  Administered 2015-12-19 – 2015-12-21 (×7): 500 mg via ORAL
  Filled 2015-12-19 (×7): qty 1

## 2015-12-19 MED ORDER — METHOCARBAMOL 500 MG PO TABS
500.0000 mg | ORAL_TABLET | Freq: Once | ORAL | Status: AC
  Administered 2015-12-19: 500 mg via ORAL
  Filled 2015-12-19: qty 1

## 2015-12-19 MED ORDER — POTASSIUM CHLORIDE 20 MEQ PO PACK
40.0000 meq | PACK | Freq: Two times a day (BID) | ORAL | Status: AC
  Administered 2015-12-19 (×2): 40 meq via ORAL
  Filled 2015-12-19 (×2): qty 2

## 2015-12-19 MED ORDER — MELATONIN 3 MG PO TABS
3.0000 mg | ORAL_TABLET | Freq: Once | ORAL | Status: AC
  Administered 2015-12-19: 3 mg via ORAL
  Filled 2015-12-19: qty 1

## 2015-12-19 NOTE — Progress Notes (Signed)
Name: Olivia [name] MRN: [id number] DOB: 12/07/1931    ADMISSION DATE:  12/16/2015 CONSULTATION DATE:  12/16/15  REFERRING MD :  Dr. Alfredia Ferguson  CHIEF COMPLAINT:  SOB    HISTORY OF PRESENT ILLNESS:  82 y/o F, former smoker (quit 2009),  with PMH of anxiety, GERD, recurrent UTI's, HTN, HLD, lumbar radiculopathy, myopathy, osteoarthritis, hypothyroidism and 2L O2 dependent GOLD III COPD (spirometry 01/2012 with an FEV1 of 1.01 / 48% predicted) presented to [facility] on 10/24 with several months of progressive shortness of breath.    She was seen in the pulmonary office on 10/24 with the above complaints.  The patient reported several months of progressive shortness of breath & "squeezing" sensation in her chest.  Symptoms were relieved with sitting up and leaning forward.  She was referred (reluctantly) to the ER for evaluation.  Initial evaluation included an EKG which did not show ischemic changes.  She developed orthostatic hypotension with SBPs in the 50's upon standing.  She was treated with IVF's with improvement.  O2 needs increased in ER with a clear CXR.  Labs - Na 129, K 3.6, Cl 93, BUN 16, Sr Cr 0.89, glucose 92, troponin 0.0, BNP 57, PCT < 0.10, WBC 3.6, Hgb 12.3 and platelets 145.  Initial CXR showed linear bibasilar atelectasis or scarring, small effusions, mild cardiomegaly. Given O2 needs the patient was evaluated with a CTA of the chest which was negative for PE but showed a 3.5 x 2.8 cm sub carinal mass, mediastinal adenopathy and mild emphysematous disease in the upper lobes bilaterally.   PCCM consulted for respiratory distress.   SUBJECTIVE:   Feeling a bit better.  Breathing is better than at home.Still c/o mild chest discomfort, mild SOB but better at rest.   VITAL SIGNS: Temp:  [97.9 F (36.6 C)-98.5 F (36.9 C)] 98.2 F (36.8 C) (10/27 0738) Pulse Rate:  [71-85] 85 (10/27 1000) Resp:  [17-33] 33 (10/27 1000) BP: (102-145)/(66-86) 102/71 (10/27 1000) SpO2:  [91 %-97 %] 91 %  (10/27 1000) Weight:  [144 lb 2.9 oz (65.4 kg)] 144 lb 2.9 oz (65.4 kg) (10/27 0403)  PHYSICAL EXAMINATION: General:  Frail elderly female in NAD, supine in bed Neuro:  Alert, oriented to self and place.  MAE, gen weakness  HEENT:  [place]/AT, PERRL, no JVD Cardiovascular:  RRR, no MRG. No edema Lungs:  resps even non labored on [place], speaks in full sentences, Poor air movement Abdomen:  Soft, non-tender, non-distended Musculoskeletal:  No acute deformity. Skin:  Grossly intact, tenting   Recent Labs Lab 12/18/15 0348 12/19/15 0214 12/19/15 0632  NA 122*  122* 119* 120*  K 3.5  3.8 3.1* 3.2*  CL 85*  84* 78* 78*  CO2 28  28 31 31   BUN 12  11 18 19   CREATININE 0.80  0.76 0.87 0.85  GLUCOSE 144*  143* 159* 157*    Recent Labs Lab 12/17/15 1519 12/18/15 0348 12/19/15 0214  HGB 12.8 12.9 13.0  HCT 36.8 37.0 35.7*  WBC 3.8* 1.9* 6.0  PLT 151 146* 162   Dg Chest Port 1 View  Result Date: 12/18/2015 CLINICAL DATA:  Shortness of breath. EXAM: PORTABLE CHEST 1 VIEW COMPARISON:  12/17/2015 FINDINGS: The cardiac silhouette is borderline enlarged. Thoracic aortic atherosclerosis is again noted. Subcarinal lymphadenopathy/mass is better seen on recent CT. Less gaseous distension of hiatal hernia. Lung volumes remain diminished with unchanged bibasilar opacities. No sizable pleural effusion or pneumothorax is identified. IMPRESSION: 1. Low lung volumes with  unchanged bibasilar atelectasis. 2. Subcarinal lymphadenopathy/mass as demonstrated on recent CT. 3. Aortic atherosclerosis. Electronically Signed   By: Logan Bores M.D.   On: 12/18/2015 08:37   Dg Abd Portable 1v  Result Date: 12/17/2015 CLINICAL DATA:  Left-sided abdominal pain and nausea for the past 2 days. EXAM: PORTABLE ABDOMEN - 1 VIEW COMPARISON:  Chest CT from 1 day prior, same day chest radiograph l. FINDINGS: The bowel gas pattern is nonspecific without free air or bowel obstruction noted. There are streaky parenchymal  airspace opacities at the lung bases consistent with bibasilar atelectasis. No effusion is identified. There is no free air. There is mild dextroconvex scoliosis of the upper lumbar spine at L2. There is lumbar spondylosis and multilevel lumbar facet arthropathy. No acute osseous abnormality. IMPRESSION EDIT: IMPRESSION EDIT No bowel obstruction or free air. Electronically Signed   By: Ashley Royalty M.D.   On: 12/17/2015 20:01   SIGNIFICANT EVENTS  10/24  Admit with SOB, found to have a mediastinal mass, LAN   STUDIES:  CT Chest 10/24 >> negative for PE but showed a 3.5 x 2.8 cm sub carinal mass, mediastinal adenopathy and mild emphysematous disease in the upper lobes bilaterally.  2 D Echo>> Pending 10/27  ASSESSMENT / PLAN:  Acute hypoxemic/hypercarbic respiratory failure secondary to COPD(GOLD III, O2 dependent) with questionable exacerbation vs. pulmonary edema related to CHF. Also some abdominal distension and nausea. Plan: - Supplemental O2 to target SpO2  88-92% ( 4 L 10/27) - Scheduled albuterol and BD's - Defer BiPAP for now - Intermittent CXR - No lasix today as soft BP  - Fluid restriction decreased to  800 cc daily per primary team.( For hyponatremia)  -Solumedrol 40mg  q8 hours -Scheduled Duoneb and PRN albuterol -Echocardiogram pending 10/27 -Repeat troponin negative  -Strict I&O   Mediastinal Mass with associated LAN - worrisome for malignancy Plan: Patient will need outpatient workup with PET/CT imaging and  Possible Biopsy for staging and tissue typing once respiratory status has been stabalized. Need to establish  goals of care in this 82 year old, as palliation Fricke be best option.  Pt. Has appointments scheduled with [facility] for follow up 12/24/15 with Rexene Edison NP ( if patient has been discharged at that time), and then 01/07/2016 with Dr. Lake Bells.  Magdalen Spatz, NP 12/19/2015  11:35 AM Pager:  [phone]  Attending Note:  82 year old female  with mediastinal mass with LAN and hyponatremia.  PCCM called for hypercarbia and hypoxemia due to COPD and pulmonary edema.  On exam, BP is much improved now and lungs with decreased BS at the bases.  I reviewed chest CT myself, mediastinal LAN noted.  Discussed with PCCM-NP.  Acute on chronic respiratory failure:             - Titrate O2 for sat of 88-92%             - Monitor.  Acute pulmonary edema             - Continue gentle diureses.             - Treat heart failure             - Strict I/O.  COPD:             - Albuterol             - Bronchodilators as ordered.  Lung mass:             -  Would not recommend any aggressive action at this time.             - Recommend involvement of palliative care.  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             - Renal following             - Fluid restriction.  Patient seen and examined, agree with above note.  I dictated the care and orders written for this patient under my direction.  Rush Farmer, MD [phone]

## 2015-12-19 NOTE — Telephone Encounter (Signed)
Called and spoke with pts daughter and she is aware of appt with BQ on 11/15.  She is aware to keep the appt with TP on 11/1.

## 2015-12-19 NOTE — Progress Notes (Signed)
Lab called Critical Sodium level of 119. Spoke with Dr. Hal Hope. Pt Fluid Restriction moved down to 800. Basic metabolic panel to be repeated at 0735

## 2015-12-19 NOTE — Progress Notes (Signed)
Admit: 12/16/2015 LOS: 2  80F AoC SOB / COPD exacerbation, new mediastinal mass, worsened hyponatremia  Subjective:  SNa stable 120-122 Good UOP  Intake of 1.6L yesterday Pt oriented to self, not year or location  10/26 0701 - 10/27 0700 In: 1630 [P.O.:1080; I.V.:550] Out: 1600 [Urine:1600]  Filed Weights   12/17/15 2300 12/18/15 0500 12/19/15 0403  Weight: 68.2 kg (150 lb 5.7 oz) 66.3 kg (146 lb 2.6 oz) 65.4 kg (144 lb 2.9 oz)    Scheduled Meds: . aspirin  81 mg Oral Daily  . aspirin EC  325 mg Oral Once  . enoxaparin (LOVENOX) injection  40 mg Subcutaneous Q24H  . famotidine  10 mg Oral QHS  . ipratropium-albuterol  3 mL Nebulization Q6H  . levothyroxine  25 mcg Oral QAC breakfast  . mouth rinse  15 mL Mouth Rinse BID  . methylPREDNISolone (SOLU-MEDROL) injection  40 mg Intravenous Q8H  . pantoprazole  40 mg Oral QAC breakfast  . PARoxetine  20 mg Oral Daily  . potassium chloride  40 mEq Oral BID   Continuous Infusions:  PRN Meds:.acetaminophen, albuterol, ondansetron (ZOFRAN) IV  Current Labs: reviewed  U Osm 300 and 600s U Na elevated TSH WNL   Physical Exam:  Blood pressure 134/81, pulse 75, temperature 98.2 F (36.8 C), temperature source Oral, resp. rate 17, height 5\' 1"  (1.549 m), weight 65.4 kg (144 lb 2.9 oz), SpO2 97 %. NAD, sleeping RRR CTAB No LEE S/nt/nd EOMI NCAT AAO to self only  A 1. Hypotonic Euvolemic hyponatremia likely 2/2 SIADH 1. U Osm inappropriately elevatedl; U Na elevated 2. TSH WNL;GFR WNL 3. Thiazide held 2. New mediastinal mass 3. COPD Exacerbatoin 4. Hypothyroidism  P 1. Cont fluid restriction -- lower to 545mL/d 2. Encourage protein / Na intake 3. With U Osm of 300, lasix unlikely to be effective 4. BID BMP 5. Check Uric Acid 6. Follw U Osm   Pearson Grippe MD 12/19/2015, 10:37 AM   Recent Labs Lab 12/17/15 1519  12/18/15 0348 12/19/15 0214 12/19/15 0632  NA 123*  < > 122*  122* 119* 120*  K 3.9  --  3.5   3.8 3.1* 3.2*  CL 85*  --  85*  84* 78* 78*  CO2 31  --  28  28 31 31   GLUCOSE 96  --  144*  143* 159* 157*  BUN 12  --  12  11 18 19   CREATININE 0.80  --  0.80  0.76 0.87 0.85  CALCIUM 8.8*  --  8.5*  8.6* 8.7* 8.8*  PHOS 3.2  --  3.3  3.2 1.7*  --   < > = values in this interval not displayed.  Recent Labs Lab 12/17/15 1519 12/18/15 0348 12/19/15 0214  WBC 3.8* 1.9* 6.0  NEUTROABS 2.6 1.5* 5.2  HGB 12.8 12.9 13.0  HCT 36.8 37.0 35.7*  MCV 90.6 90.5 87.1  PLT 151 146* 162

## 2015-12-19 NOTE — Progress Notes (Addendum)
PROGRESS NOTE    Olivia [name]  MRN:[id number] DOB: [birth year] DOA: 12/16/2015 PCP: Binnie Rail, MD  Brief Narrative:  Olivia [name] is a 82 y.o. female with a past medical history significant for COPD GOLD III, FEV1 47%, hypothyroidism, anxiety, and HTN who presents with significant worsening dyspnea over months and now chest pain. She was found to have a Mediastinal Mass on CT scan. She was transferred to SDU yesterday because of her tenuous respiratory status.   Assessment & Plan:   Principal Problem:   Chest pain Active Problems:   Essential hypertension   COPD GOLD III with atypical upper airway features    Hypothyroidism   SOB (shortness of breath)   Chronic respiratory failure with hypoxia (HCC)   Mediastinal mass   Hypoxia   1. Dyspnea and Acute on Chronic Hypercarbic Hypoxic Respiratory likely 2/2 to COPD Exacerbation with Concomitant Pulmonary Edema from Acute Exacerbation of Diastolic CHF:  -Dyspnea out of proportion to CT imaging and SpO2 and labs. -Recent echocardiogram with normal EF of 55-60% but did have Diastolic Dysfunction (grade 1). *Repeat Echo showed  Left ventricle: The cavity size was normal. Wall thickness was increased in a pattern of moderate LVH. Systolic function was normal. The estimated ejection fraction was in the range of 60% to 65%. Wall motion was normal; there were no regional wall motion abnormalities. Doppler parameters are consistent with  abnormal left ventricular relaxation (grade 1 diastolic dysfunction). -Inhalers D/C'd by PCCM -Patient placed on DuoNebs q6h and Albuterol Nebs q2hprn -IV Methylprednisolone 40 mg q6h per Pulmonology -Repeat Chest X-Ray showed The cardiac silhouette is borderline enlarged. Thoracic aortic atherosclerosis is again noted. Subcarinal lymphadenopathy/mass is better seen on recent CT. Less gaseous distension of hiatal hernia. Lung volumes remain diminished with unchanged bibasilar opacities. No sizable pleural  effusion or pneumothorax is identified. -Pulmonary Diuresing with IV 40 mg of Lasix x 1 dose. -Strict I's and O's -Pulmonary Deferring NIPPV at this time -Patient on SDU because of tenuous respiratory status.  -Appreciate Pulmonary Consultation and additional recommendations.   2. Hyponatremia -Appreciate Nephrology Consult and Reccomendations -Nephrology thinks its from SIADH from Mediastinal Mass  -Sodium went from 129 -> 123 -> 121-> 119 -PCCM gave patient Lasix IV 40 mg x 1 dose  -Urine Osmol was 617 and Urine Sodium was 147  -Nephrology ordered BMP BID -Uric Acid Level and follow Urine Osmols per Nephrology -Fluid Restrict to 500 mL/day -Strict I's and O's  3. Acute Exacerbation of Chronic Diastolic CHF -ECHO showed Left ventricle: The cavity size was normal. Wall thickness was increased in a pattern of moderate LVH. Systolic function was normal. The estimated ejection fraction was in the range of 60% to 65%. Wall motion was normal; there were no regional wall motion abnormalities. Doppler parameters are consistent with  abnormal left ventricular relaxation (grade 1 diastolic dysfunction). -Likely worsened by IVF however patient was Orthostatic -IV Diuresis via Lasix if patient tolerates it as she has had tenuous Blood Pressures -Strict I's and O's and 500 mL/day Fluid Restriction  4. Chest discomfort r/o ACS, improved -PE has been ruled out, as has dissection.   -Unclear that this is not an anginal equivalent (shortness of breath and nausea).   -Serial enzymes overnight showed <0.03 x 6;  -Transthoracic Echo showed  Left ventricle: The cavity size was normal. Wall thickness was increased in a pattern of moderate LVH. Systolic function was normal. The estimated ejection fraction was in the range of 60% to 65%.  Wall motion was normal; there were no regional wall motion abnormalities. Doppler parameters are consistent with  abnormal left ventricular relaxation (grade 1 diastolic  dysfunction). -Continue ASA  5. Hypomagnesemia, improved -Patients Mag was 2.1 -Repleted. -Repeat Mag Level in AM  6. COPD with chronic respiratory failure and Underlying Emphysema:  -FEV1 47% most recently.  On O2 since this summer -Treatment as in #1  7. HTN  -Hypotension improved -D/C Chlorthalidone Indefinitely; Hold Metoprolol -Continue aspirin  8. Hypothyroidism:  -TSH was 0.470 and Free T4 was 0.89 -Continue Levothyroxine 25 mcg po Before Breakfast  9. Mediastinal mass ? Malignancy:  -Biopsy currently is being planned as outpatient. -Will need PET/CT Imaging as an outpatient -[place] need to discuss Palliative Care with Patient and Patient's family  93. Leukopenia and Thrombocytopenia, improved -WBC went from 3.6 -> 3.8 -> 1.9 -> 6.0 and Platelets went from 145 -> 151 -> 146 -> 162 -Unclear etiology  -Repeat CBC in AM  11. GERD -Continue H2RA, PPI  12. Anxiety -Discontinue Paxil as it Gulas contribute to Hyponatremia  DVT prophylaxis: Lovenox Code Status: Full Family Communication: Discussed plan of care with son at bedside.  Disposition Plan: Possible SNF vs. Home with Home Health once respiratory status improves  Consultants:   PCCM/Pulmonary  Nephrology  Procedures: None  Antimicrobials: None  Subjective: Patient seen and examined this AM and she stated she was sleepy. She felt as if her SOB was improved. No CP/Discomfort today. No N/V or lightheadedness or dizziness. No other complaints or concerns.    Objective: Vitals:   12/19/15 0800 12/19/15 0900 12/19/15 1000 12/19/15 1211  BP: (!) 145/82 123/72 102/71 108/72  Pulse: 71 76 85 82  Resp: 18 20 (!) 33 19  Temp:    97.5 F (36.4 C)  TempSrc:    Oral  SpO2: 94% 91% 91% 92%  Weight:      Height:        Intake/Output Summary (Last 24 hours) at 12/19/15 1337 Last data filed at 12/19/15 [id number]  Gross per 24 hour  Intake            852.5 ml  Output             1150 ml  Net           -297.5  ml   Filed Weights   12/17/15 2300 12/18/15 0500 12/19/15 0403  Weight: 68.2 kg (150 lb 5.7 oz) 66.3 kg (146 lb 2.6 oz) 65.4 kg (144 lb 2.9 oz)    Examination: Physical Exam:  Constitutional: Thin elderly female in NAD Eyes: Lids and conjunctivae normal, sclerae anicteric  ENMT: External Ears, Nose appear normal. Grossly decreased hearing.  Neck: Appears normal, supple, no cervical masses, normal ROM, no appreciable thyromegaly, No JVD.  Respiratory: Diminished with crackles. No rhonchi. Normal respiratory effort and patient is slightly tachypenic. No accessory muscle use.  Cardiovascular: RRR, no murmurs / rubs / gallops. S1 and S2 auscultated. Very mild  extremity edema.  Abdomen: Soft, non-tender, distended 2/2 body habitus. No masses palpated. No appreciable hepatosplenomegaly. Bowel sounds positive x4.  GU: Deferred. Musculoskeletal: No clubbing / cyanosis of digits/nails. No joint deformity upper and lower extremities.  Skin: No rashes, lesions, ulcers. No induration; Warm and dry.  Neurologic: Patient somnolent but arousable. CN 2-12 grossly intact with no focal deficits. Sensation intact in all 4 Extremities. Romberg sign cerebellar reflexes not assessed.  Psychiatric: Normal judgment and insight. Alert and oriented x 3. Anxious mood and appropriate affect.  Data Reviewed: I have personally reviewed following labs and imaging studies  CBC:  Recent Labs Lab 12/16/15 1320 12/17/15 1519 12/18/15 0348 12/19/15 0214  WBC 3.6* 3.8* 1.9* 6.0  NEUTROABS  --  2.6 1.5* 5.2  HGB 12.3 12.8 12.9 13.0  HCT 36.1 36.8 37.0 35.7*  MCV 91.4 90.6 90.5 87.1  PLT 145* 151 146* 0000000   Basic Metabolic Panel:  Recent Labs Lab 12/16/15 1320 12/17/15 1519 12/17/15 2340 12/18/15 0348 12/19/15 0214 12/19/15 0632  NA 129* 123* 121* 122*  122* 119* 120*  K 3.6 3.9  --  3.5  3.8 3.1* 3.2*  CL 93* 85*  --  85*  84* 78* 78*  CO2 29 31  --  28  28 31 31   GLUCOSE 92 96  --  144*  143*  159* 157*  BUN 16 12  --  12  11 18 19   CREATININE 0.89 0.80  --  0.80  0.76 0.87 0.85  CALCIUM 9.0 8.8*  --  8.5*  8.6* 8.7* 8.8*  MG  --  1.4*  --  2.1 1.7  --   PHOS  --  3.2  --  3.3  3.2 1.7*  --    GFR: Estimated Creatinine Clearance: 44.1 mL/min (by C-G formula based on SCr of 0.85 mg/dL). Liver Function Tests:  Recent Labs Lab 12/17/15 1519 12/18/15 0348 12/19/15 0214  AST 67* 66*  --   ALT 34 35  --   ALKPHOS 70 74  --   BILITOT 0.8 0.8  --   PROT 6.4* 6.4*  --   ALBUMIN 3.1* 3.1*  3.0* 3.4*   No results for input(s): LIPASE, AMYLASE in the last 168 hours. No results for input(s): AMMONIA in the last 168 hours. Coagulation Profile: No results for input(s): INR, PROTIME in the last 168 hours. Cardiac Enzymes:  Recent Labs Lab 12/17/15 0630 12/17/15 0833 12/17/15 1519 12/17/15 2242 12/18/15 0348  TROPONINI <0.03 <0.03 <0.03 <0.03 <0.03   BNP (last 3 results)  Recent Labs  09/17/15 1551 12/09/15 1735  PROBNP 119.0* 31.0   HbA1C: No results for input(s): HGBA1C in the last 72 hours. CBG: No results for input(s): GLUCAP in the last 168 hours. Lipid Profile: No results for input(s): CHOL, HDL, LDLCALC, TRIG, CHOLHDL, LDLDIRECT in the last 72 hours. Thyroid Function Tests:  Recent Labs  12/18/15 1532  TSH 0.470  FREET4 0.89   Anemia Panel: No results for input(s): VITAMINB12, FOLATE, FERRITIN, TIBC, IRON, RETICCTPCT in the last 72 hours. Sepsis Labs:  Recent Labs Lab 12/17/15 0015 12/18/15 0348  PROCALCITON <0.10 0.11    Recent Results (from the past 240 hour(s))  MRSA PCR Screening     Status: None   Collection Time: 12/17/15 11:10 PM  Result Value Ref Range Status   MRSA by PCR NEGATIVE NEGATIVE Final    Comment:        The GeneXpert MRSA Assay (FDA approved for NASAL specimens only), is one component of a comprehensive MRSA colonization surveillance program. It is not intended to diagnose MRSA infection nor to guide  or monitor treatment for MRSA infections.     Radiology Studies: Dg Chest Port 1 View  Result Date: 12/18/2015 CLINICAL DATA:  Shortness of breath. EXAM: PORTABLE CHEST 1 VIEW COMPARISON:  12/17/2015 FINDINGS: The cardiac silhouette is borderline enlarged. Thoracic aortic atherosclerosis is again noted. Subcarinal lymphadenopathy/mass is better seen on recent CT. Less gaseous distension of hiatal hernia. Lung volumes remain diminished with unchanged bibasilar  opacities. No sizable pleural effusion or pneumothorax is identified. IMPRESSION: 1. Low lung volumes with unchanged bibasilar atelectasis. 2. Subcarinal lymphadenopathy/mass as demonstrated on recent CT. 3. Aortic atherosclerosis. Electronically Signed   By: Logan Bores M.D.   On: 12/18/2015 08:37   Dg Abd Portable 1v  Result Date: 12/17/2015 CLINICAL DATA:  Left-sided abdominal pain and nausea for the past 2 days. EXAM: PORTABLE ABDOMEN - 1 VIEW COMPARISON:  Chest CT from 1 day prior, same day chest radiograph l. FINDINGS: The bowel gas pattern is nonspecific without free air or bowel obstruction noted. There are streaky parenchymal airspace opacities at the lung bases consistent with bibasilar atelectasis. No effusion is identified. There is no free air. There is mild dextroconvex scoliosis of the upper lumbar spine at L2. There is lumbar spondylosis and multilevel lumbar facet arthropathy. No acute osseous abnormality. IMPRESSION EDIT: IMPRESSION EDIT No bowel obstruction or free air. Electronically Signed   By: Ashley Royalty M.D.   On: 12/17/2015 20:01   Scheduled Meds: . aspirin  81 mg Oral Daily  . aspirin EC  325 mg Oral Once  . enoxaparin (LOVENOX) injection  40 mg Subcutaneous Q24H  . famotidine  10 mg Oral QHS  . ipratropium-albuterol  3 mL Nebulization Q6H  . levothyroxine  25 mcg Oral QAC breakfast  . mouth rinse  15 mL Mouth Rinse BID  . methylPREDNISolone (SOLU-MEDROL) injection  40 mg Intravenous Q8H  . pantoprazole  40  mg Oral QAC breakfast  . PARoxetine  20 mg Oral Daily  . potassium chloride  40 mEq Oral BID   Continuous Infusions:    LOS: 2 days    Kerney Elbe, DO [facility] Pager [phone]  If 7PM-7AM, please contact night-coverage [email or web address] Password [email or web address] 12/19/2015, 1:37 PM

## 2015-12-19 NOTE — Progress Notes (Signed)
*  PRELIMINARY RESULTS* Echocardiogram 2D Echocardiogram has been performed.  Leavy [name] 12/19/2015, 11:48 AM

## 2015-12-20 ENCOUNTER — Inpatient Hospital Stay (HOSPITAL_COMMUNITY): Payer: Medicare Other

## 2015-12-20 DIAGNOSIS — E222 Syndrome of inappropriate secretion of antidiuretic hormone: Secondary | ICD-10-CM

## 2015-12-20 LAB — RENAL FUNCTION PANEL
Albumin: 3.2 g/dL — ABNORMAL LOW (ref 3.5–5.0)
Anion gap: 7 (ref 5–15)
BUN: 17 mg/dL (ref 6–20)
CALCIUM: 8.8 mg/dL — AB (ref 8.9–10.3)
CO2: 32 mmol/L (ref 22–32)
CREATININE: 0.89 mg/dL (ref 0.44–1.00)
Chloride: 78 mmol/L — ABNORMAL LOW (ref 101–111)
GFR calc non Af Amer: 59 mL/min — ABNORMAL LOW (ref >60)
GLUCOSE: 129 mg/dL — AB (ref 65–99)
Phosphorus: 1.8 mg/dL — ABNORMAL LOW (ref 2.5–4.6)
Potassium: 4.5 mmol/L (ref 3.5–5.1)
SODIUM: 117 mmol/L — AB (ref 135–145)

## 2015-12-20 LAB — BASIC METABOLIC PANEL
Anion gap: 13 (ref 5–15)
BUN: 16 mg/dL (ref 6–20)
CALCIUM: 8.8 mg/dL — AB (ref 8.9–10.3)
CHLORIDE: 77 mmol/L — AB (ref 101–111)
CO2: 29 mmol/L (ref 22–32)
CREATININE: 0.88 mg/dL (ref 0.44–1.00)
GFR calc non Af Amer: 60 mL/min — ABNORMAL LOW (ref >60)
GLUCOSE: 144 mg/dL — AB (ref 65–99)
Potassium: 3.8 mmol/L (ref 3.5–5.1)
Sodium: 119 mmol/L — CL (ref 135–145)

## 2015-12-20 LAB — CBC WITH DIFFERENTIAL/PLATELET
BASOS PCT: 0 %
Basophils Absolute: 0 10*3/uL (ref 0.0–0.1)
EOS ABS: 0 10*3/uL (ref 0.0–0.7)
EOS PCT: 0 %
HCT: 36.9 % (ref 36.0–46.0)
HEMOGLOBIN: 13.4 g/dL (ref 12.0–15.0)
Lymphocytes Relative: 11 %
Lymphs Abs: 0.9 10*3/uL (ref 0.7–4.0)
MCH: 31.6 pg (ref 26.0–34.0)
MCHC: 36.3 g/dL — AB (ref 30.0–36.0)
MCV: 87 fL (ref 78.0–100.0)
MONOS PCT: 4 %
Monocytes Absolute: 0.3 10*3/uL (ref 0.1–1.0)
NEUTROS PCT: 85 %
Neutro Abs: 6.7 10*3/uL (ref 1.7–7.7)
PLATELETS: 192 10*3/uL (ref 150–400)
RBC: 4.24 MIL/uL (ref 3.87–5.11)
RDW: 13.6 % (ref 11.5–15.5)
WBC: 7.9 10*3/uL (ref 4.0–10.5)

## 2015-12-20 LAB — MAGNESIUM: Magnesium: 1.8 mg/dL (ref 1.7–2.4)

## 2015-12-20 LAB — PROCALCITONIN

## 2015-12-20 LAB — OSMOLALITY, URINE: OSMOLALITY UR: 376 mosm/kg (ref 300–900)

## 2015-12-20 MED ORDER — FUROSEMIDE 10 MG/ML IJ SOLN
20.0000 mg | Freq: Two times a day (BID) | INTRAMUSCULAR | Status: DC
  Administered 2015-12-20 – 2015-12-23 (×7): 20 mg via INTRAVENOUS
  Filled 2015-12-20 (×7): qty 2

## 2015-12-20 MED ORDER — SODIUM CHLORIDE 1 G PO TABS
1.0000 g | ORAL_TABLET | Freq: Two times a day (BID) | ORAL | Status: DC
  Administered 2015-12-20 – 2015-12-21 (×3): 1 g via ORAL
  Filled 2015-12-20 (×3): qty 1

## 2015-12-20 NOTE — Progress Notes (Signed)
CRITICAL VALUE ALERT  Critical value received: sodium level 117  Date of notification:  12/20/15  Time of notification:  0700  Critical value read back:yes  Nurse who received alert:  Loma Boston, RN  MD notified (1st page):  Dr Alfredia Ferguson  Time of first page:  0704  MD notified (2nd page):  Time of second page:  Responding MD:    Time MD responded:

## 2015-12-20 NOTE — Progress Notes (Signed)
Admit: 12/16/2015 LOS: 3  61F AoC SOB / COPD exacerbation, new mediastinal mass, worsened hyponatremia  Subjective:  Na level down to 117 0.4L In reported, 6 voids reportd Pt somnolent, wakes to stimulus, has not eaten breakfast   Sodium  Date Value  12/20/2015 117 mmol/L (LL)  12/19/2015 119 mmol/L (LL)  12/19/2015 120 mmol/L (L)  12/19/2015 119 mmol/L (LL)  12/18/2015 122 mmol/L (L)  12/18/2015 122 mmol/L (L)  12/17/2015 121 mmol/L (L)  12/17/2015 123 mmol/L (L)  12/16/2015 129 mmol/L (L)  12/09/2015 141 mEq/L  12/09/2015 142 mEq/L  ]  10/27 0701 - 10/28 0700 In: 420 [P.O.:420] Out: -   Filed Weights   12/18/15 0500 12/19/15 0403 12/20/15 0421  Weight: 66.3 kg (146 lb 2.6 oz) 65.4 kg (144 lb 2.9 oz) 66.8 kg (147 lb 4.3 oz)    Scheduled Meds: . aspirin  81 mg Oral Daily  . aspirin EC  325 mg Oral Once  . enoxaparin (LOVENOX) injection  40 mg Subcutaneous Q24H  . famotidine  10 mg Oral QHS  . ipratropium-albuterol  3 mL Nebulization Q6H  . levothyroxine  25 mcg Oral QAC breakfast  . mouth rinse  15 mL Mouth Rinse BID  . methylPREDNISolone (SOLU-MEDROL) injection  40 mg Intravenous Q8H  . pantoprazole  40 mg Oral QAC breakfast  . PARoxetine  20 mg Oral Daily  . sodium chloride  1 g Oral BID WC   Continuous Infusions:  PRN Meds:.acetaminophen, albuterol, methocarbamol, ondansetron (ZOFRAN) IV  Current Labs: reviewed  U Osm 300 and 600s U Na elevated TSH WNL   Physical Exam:  Blood pressure (!) 151/96, pulse 73, temperature 97.7 F (36.5 C), temperature source Oral, resp. rate 18, height 5\' 1"  (1.549 m), weight 66.8 kg (147 lb 4.3 oz), SpO2 96 %. NAD, sleeping RRR CTAB No LEE S/nt/nd EOMI NCAT AAO to self only  A 1. Hypotonic Euvolemic hyponatremia likely 2/2 SIADH 1. U Osm inappropriately elevated; U Na elevated consistent with SIADH 2. TSH WNL;GFR WNL 3. Thiazide held 2. New mediastinal mass, ? SCLC 3. COPD  Exacerbatoin 4. Hypothyroidism 5. MDD on SSRI  P 1. Cont fluid restriction -- 566mL/d 2. encourage increased protein / Na intake 3. Trial of lasix even tough U Osm < 600 4. BID BMP 5. Follw U Osm 6. Stop SSRI 7. Na tabs unlikely to be of much benefit; increase PO intake more likely   Pearson Grippe MD 12/20/2015, 9:16 AM   Recent Labs Lab 12/18/15 0348 12/19/15 0214 12/19/15 0632 12/19/15 1626 12/20/15 0539  NA 122*  122* 119* 120* 119* 117*  K 3.5  3.8 3.1* 3.2* 3.8 4.5  CL 85*  84* 78* 78* 78* 78*  CO2 28  28 31 31 30  32  GLUCOSE 144*  143* 159* 157* 144* 129*  BUN 12  11 18 19 20 17   CREATININE 0.80  0.76 0.87 0.85 0.94 0.89  CALCIUM 8.5*  8.6* 8.7* 8.8* 8.8* 8.8*  PHOS 3.3  3.2 1.7*  --   --  1.8*    Recent Labs Lab 12/18/15 0348 12/19/15 0214 12/20/15 0539  WBC 1.9* 6.0 7.9  NEUTROABS 1.5* 5.2 6.7  HGB 12.9 13.0 13.4  HCT 37.0 35.7* 36.9  MCV 90.5 87.1 87.0  PLT 146* 162 192

## 2015-12-20 NOTE — Progress Notes (Signed)
PROGRESS NOTE    Olivia [name]  MRN:[id number] DOB: 04-15-1931 DOA: 12/16/2015 PCP: Binnie Rail, MD  Brief Narrative:  Olivia [name] is a 82 y.o. female with a past medical history significant for COPD GOLD III, FEV1 47%, hypothyroidism, anxiety, and HTN who presents with significant worsening dyspnea over months and now chest pain. She was found to have a Mediastinal Mass on CT scan. She was transferred to SDU  because of her tenuous respiratory status. Nephrology was consulted for Hyponatremia.   Assessment & Plan:   Principal Problem:   Chest pain Active Problems:   Essential hypertension   COPD GOLD III with atypical upper airway features    Hypothyroidism   Dyspnea   Chronic respiratory failure with hypoxia (HCC)   Mediastinal mass   Hypoxia   Acute on chronic diastolic congestive heart failure ([place])   1. Dyspnea and Acute on Chronic Hypercarbic Hypoxic Respiratory likely 2/2 to COPD Exacerbation with Concomitant Pulmonary Edema from Acute Exacerbation of Diastolic CHF:  -Dyspnea out of proportion to CT imaging and SpO2 and labs. -Recent echocardiogram with normal EF of 55-60% but did have Diastolic Dysfunction (grade 1). *Repeat Echo showed  Left ventricle: The cavity size was normal. Wall thickness was increased in a pattern of moderate LVH. Systolic function was normal. The estimated ejection fraction was in the range of 60% to 65%. Wall motion was normal; there were no regional wall motion abnormalities. Doppler parameters are consistent with  abnormal left ventricular relaxation (grade 1 diastolic dysfunction). -Inhalers D/C'd by PCCM -Patient placed on DuoNebs q6h and Albuterol Nebs q2hprn -IV Methylprednisolone 40 mg q6h per Pulmonology -Repeat Chest X-Ray showed The cardiac silhouette is borderline enlarged. Thoracic aortic atherosclerosis is again noted. Subcarinal lymphadenopathy/mass is better seen on recent CT. Less gaseous distension of hiatal hernia. Lung volumes  remain diminished with unchanged bibasilar opacities. No sizable pleural effusion or pneumothorax is identified. -Will try Lasix IV 20 mg BID per Nephro -Strict I's and O's -Pulmonary Deferring NIPPV at this time -Patient on SDU because of tenuous respiratory status.  -Appreciate Pulmonary Consultation and additional recommendations -Slowly improving; Patient wanted Albuterol Treatment this AM.   2. Hyponatremia suspected to be SIADH in the Setting of [facility] Consult and Reccomendations -Nephrology thinks its from SIADH from Mediastinal Mass  -Sodium went from 129 -> 123 -> 121-> 119 -> 117 -PCCM gave patient Lasix IV 40 mg x 1 dose; Will Try IV Lasix 20 mg BID -Urine Osmol was 617 and Urine Sodium was 147  -Nephrology ordered BMP BID -Uric Acid Level and follow Urine Osmols per Nephrology -Fluid Restrict to 500 mL/day -Started Salt Tabs 1 gram BID with Meals -Strict I's and O's  3. Acute Exacerbation of Chronic Diastolic CHF -ECHO showed Left ventricle: The cavity size was normal. Wall thickness was increased in a pattern of moderate LVH. Systolic function was normal. The estimated ejection fraction was in the range of 60% to 65%. Wall motion was normal; there were no regional wall motion abnormalities. Doppler parameters are consistent with  abnormal left ventricular relaxation (grade 1 diastolic dysfunction). -Likely worsened by IVF however patient was Orthostatic -IV Diuresis via Lasix with 20 mg BID if patient tolerates it as she has had tenuous Blood Pressures -Strict I's and O's and 500 mL/day Fluid Restriction  4. Chest discomfort r/o ACS, improved -PE has been ruled out, as has dissection.   -Unclear that this is not an anginal equivalent (shortness of breath  and nausea).   -Serial enzymes overnight showed <0.03 x 6;  -Transthoracic Echo showed  Left ventricle: The cavity size was normal. Wall thickness was increased in a pattern of moderate LVH.  Systolic function was normal. The estimated ejection fraction was in the range of 60% to 65%. Wall motion was normal; there were no regional wall motion abnormalities. Doppler parameters are consistent with  abnormal left ventricular relaxation (grade 1 diastolic dysfunction). -Continue ASA -IV Lasix 20 mg BID  5. Hypomagnesemia, improved -Patients Mag was 2.1 -Repleted. -Repeat Mag Level in AM  6. COPD with chronic respiratory failure and Underlying Emphysema:  -FEV1 47% most recently.  On O2 since this summer -Treatment as in #1  7. HTN  -Hypotension improved -D/C Chlorthalidone Indefinitely; Hold Metoprolol -Continue aspirin -Will Try IV Lasix  8. Hypothyroidism:  -TSH was 0.470 and Free T4 was 0.89 -Continue Levothyroxine 25 mcg po Before Breakfast  9. Mediastinal mass ? Malignancy:  -Biopsy currently is being planned as outpatient. -Will need PET/CT Imaging as an outpatient -[facility] need to discuss Palliative Care with Patient and Patient's family  74. Leukopenia and Thrombocytopenia, improved -WBC went from 3.6 -> 3.8 -> 1.9 -> 6.0 -> 7.9  -Platelets went from 145 -> 151 -> 146 -> 162 -> 192 -Unclear etiology  -Repeat CBC in AM  11. GERD -Continue H2RA, PPI  12. Anxiety -Discontinue Paxil as it Weisenberger contribute to Hyponatremia  DVT prophylaxis: Lovenox Code Status: Full Family Communication: No Family at bedside Disposition Plan: Possible SNF vs. Home with Home Health once respiratory status improves; Will need to talk to the family about goals of Care and possible Palliative Care Consultation.   Consultants:   PCCM/Pulmonary  Nephrology  Procedures: None  Antimicrobials: None  Subjective: Patient seen and examined this AM and she was a little confused. Wanted an Albuterol Treatment. Denied any Chest Pressure. No other complaints.   Objective: Vitals:   12/20/15 1530 12/20/15 1732 12/20/15 1936 12/20/15 1947  BP:  98/78  90/61  Pulse:  94  (!) 105    Resp:    (!) 25  Temp:  97.5 F (36.4 C)  97.5 F (36.4 C)  TempSrc:  Oral  Oral  SpO2: 92%  95% 91%  Weight:      Height:        Intake/Output Summary (Last 24 hours) at 12/20/15 1956 Last data filed at 12/20/15 1947  Gross per 24 hour  Intake              200 ml  Output              250 ml  Net              -50 ml   Filed Weights   12/18/15 0500 12/19/15 0403 12/20/15 0421  Weight: 66.3 kg (146 lb 2.6 oz) 65.4 kg (144 lb 2.9 oz) 66.8 kg (147 lb 4.3 oz)    Examination: Physical Exam:  Constitutional: Thin elderly female in NAD Eyes: Lids and conjunctivae normal, sclerae anicteric  ENMT: External Ears, Nose appear normal. Grossly decreased hearing.  Neck: Appears normal, supple, no cervical masses, normal ROM, no appreciable thyromegaly, No JVD.  Respiratory: Diminished with crackles. No rhonchi. Normal respiratory effort and patient is slightly tachypenic. No accessory muscle use.  Cardiovascular: RRR, no murmurs / rubs / gallops. S1 and S2 auscultated. Very mild extremity edema.  Abdomen: Soft, non-tender, distended 2/2 body habitus. No masses palpated. No appreciable hepatosplenomegaly. Bowel sounds positive  x4.  GU: Deferred. Musculoskeletal: No clubbing / cyanosis of digits/nails. No joint deformity upper and lower extremities.  Skin: No rashes, lesions, ulcers. No induration; Warm and dry.  Neurologic: Patient somnolent but arousable. CN 2-12 grossly intact with no focal deficits. Sensation intact in all 4 Extremities. Romberg sign cerebellar reflexes not assessed.  Psychiatric: Normal judgment and insight. Alert and awake but not oriented. Anxious mood and appropriate affect.   Data Reviewed: I have personally reviewed following labs and imaging studies  CBC:  Recent Labs Lab 12/16/15 1320 12/17/15 1519 12/18/15 0348 12/19/15 0214 12/20/15 0539  WBC 3.6* 3.8* 1.9* 6.0 7.9  NEUTROABS  --  2.6 1.5* 5.2 6.7  HGB 12.3 12.8 12.9 13.0 13.4  HCT 36.1 36.8 37.0  35.7* 36.9  MCV 91.4 90.6 90.5 87.1 87.0  PLT 145* 151 146* 162 [id number]   Basic Metabolic Panel:  Recent Labs Lab 12/17/15 1519  12/18/15 0348 12/19/15 0214 12/19/15 0632 12/19/15 1626 12/20/15 0539 12/20/15 1402  NA 123*  < > 122*  122* 119* 120* 119* 117* 119*  K 3.9  --  3.5  3.8 3.1* 3.2* 3.8 4.5 3.8  CL 85*  --  85*  84* 78* 78* 78* 78* 77*  CO2 31  --  28  28 31 31 30  32 29  GLUCOSE 96  --  144*  143* 159* 157* 144* 129* 144*  BUN 12  --  12  11 18 19 20 17 16   CREATININE 0.80  --  0.80  0.76 0.87 0.85 0.94 0.89 0.88  CALCIUM 8.8*  --  8.5*  8.6* 8.7* 8.8* 8.8* 8.8* 8.8*  MG 1.4*  --  2.1 1.7  --   --  1.8  --   PHOS 3.2  --  3.3  3.2 1.7*  --   --  1.8*  --   < > = values in this interval not displayed. GFR: Estimated Creatinine Clearance: 43.1 mL/min (by C-G formula based on SCr of 0.88 mg/dL). Liver Function Tests:  Recent Labs Lab 12/17/15 1519 12/18/15 0348 12/19/15 0214 12/20/15 0539  AST 67* 66*  --   --   ALT 34 35  --   --   ALKPHOS 70 74  --   --   BILITOT 0.8 0.8  --   --   PROT 6.4* 6.4*  --   --   ALBUMIN 3.1* 3.1*  3.0* 3.4* 3.2*   No results for input(s): LIPASE, AMYLASE in the last 168 hours. No results for input(s): AMMONIA in the last 168 hours. Coagulation Profile: No results for input(s): INR, PROTIME in the last 168 hours. Cardiac Enzymes:  Recent Labs Lab 12/17/15 0630 12/17/15 0833 12/17/15 1519 12/17/15 2242 12/18/15 0348  TROPONINI <0.03 <0.03 <0.03 <0.03 <0.03   BNP (last 3 results)  Recent Labs  09/17/15 1551 12/09/15 1735  PROBNP 119.0* 31.0   HbA1C: No results for input(s): HGBA1C in the last 72 hours. CBG: No results for input(s): GLUCAP in the last 168 hours. Lipid Profile: No results for input(s): CHOL, HDL, LDLCALC, TRIG, CHOLHDL, LDLDIRECT in the last 72 hours. Thyroid Function Tests:  Recent Labs  12/18/15 1532  TSH 0.470  FREET4 0.89   Anemia Panel: No results for input(s): VITAMINB12,  FOLATE, FERRITIN, TIBC, IRON, RETICCTPCT in the last 72 hours. Sepsis Labs:  Recent Labs Lab 12/17/15 0015 12/18/15 0348 12/20/15 0539  PROCALCITON <0.10 0.11 <0.10    Recent Results (from the past 240 hour(s))  MRSA  PCR Screening     Status: None   Collection Time: 12/17/15 11:10 PM  Result Value Ref Range Status   MRSA by PCR NEGATIVE NEGATIVE Final    Comment:        The GeneXpert MRSA Assay (FDA approved for NASAL specimens only), is one component of a comprehensive MRSA colonization surveillance program. It is not intended to diagnose MRSA infection nor to guide or monitor treatment for MRSA infections.     Radiology Studies: Dg Chest Port 1 View  Result Date: 12/20/2015 CLINICAL DATA:  Respiratory failure EXAM: PORTABLE CHEST 1 VIEW COMPARISON:  12/18/2015 FINDINGS: Interval improvement in bibasilar atelectasis. Negative for heart failure or effusion. No pneumothorax. IMPRESSION: Improvement in bibasilar atelectasis Electronically Signed   By: Franchot Gallo M.D.   On: 12/20/2015 09:35   Scheduled Meds: . aspirin  81 mg Oral Daily  . aspirin EC  325 mg Oral Once  . enoxaparin (LOVENOX) injection  40 mg Subcutaneous Q24H  . famotidine  10 mg Oral QHS  . furosemide  20 mg Intravenous BID  . ipratropium-albuterol  3 mL Nebulization Q6H  . levothyroxine  25 mcg Oral QAC breakfast  . mouth rinse  15 mL Mouth Rinse BID  . methylPREDNISolone (SOLU-MEDROL) injection  40 mg Intravenous Q8H  . pantoprazole  40 mg Oral QAC breakfast  . sodium chloride  1 g Oral BID WC   Continuous Infusions:    LOS: 3 days   Kerney Elbe, DO [facility] Pager [phone]  If 7PM-7AM, please contact night-coverage [email or web address] Password [facility] 12/20/2015, 7:56 PM

## 2015-12-21 DIAGNOSIS — E222 Syndrome of inappropriate secretion of antidiuretic hormone: Secondary | ICD-10-CM

## 2015-12-21 DIAGNOSIS — E871 Hypo-osmolality and hyponatremia: Secondary | ICD-10-CM

## 2015-12-21 LAB — RENAL FUNCTION PANEL
ALBUMIN: 3.2 g/dL — AB (ref 3.5–5.0)
Anion gap: 11 (ref 5–15)
BUN: 20 mg/dL (ref 6–20)
CHLORIDE: 75 mmol/L — AB (ref 101–111)
CO2: 31 mmol/L (ref 22–32)
CREATININE: 0.87 mg/dL (ref 0.44–1.00)
Calcium: 8.7 mg/dL — ABNORMAL LOW (ref 8.9–10.3)
GFR calc Af Amer: >60 mL/min (ref >60)
GLUCOSE: 147 mg/dL — AB (ref 65–99)
Phosphorus: 2.1 mg/dL — ABNORMAL LOW (ref 2.5–4.6)
Potassium: 3.5 mmol/L (ref 3.5–5.1)
Sodium: 117 mmol/L — CL (ref 135–145)

## 2015-12-21 LAB — CBC WITH DIFFERENTIAL/PLATELET
BASOS ABS: 0 10*3/uL (ref 0.0–0.1)
Basophils Relative: 0 %
EOS PCT: 0 %
Eosinophils Absolute: 0 10*3/uL (ref 0.0–0.7)
HEMATOCRIT: 35.7 % — AB (ref 36.0–46.0)
Hemoglobin: 12.7 g/dL (ref 12.0–15.0)
LYMPHS ABS: 0.6 10*3/uL — AB (ref 0.7–4.0)
LYMPHS PCT: 9 %
MCH: 30.8 pg (ref 26.0–34.0)
MCHC: 35.6 g/dL (ref 30.0–36.0)
MCV: 86.7 fL (ref 78.0–100.0)
MONO ABS: 0.2 10*3/uL (ref 0.1–1.0)
MONOS PCT: 4 %
Neutro Abs: 5.2 10*3/uL (ref 1.7–7.7)
Neutrophils Relative %: 87 %
PLATELETS: 183 10*3/uL (ref 150–400)
RBC: 4.12 MIL/uL (ref 3.87–5.11)
RDW: 13.5 % (ref 11.5–15.5)
WBC: 6 10*3/uL (ref 4.0–10.5)

## 2015-12-21 LAB — URINALYSIS, ROUTINE W REFLEX MICROSCOPIC
BILIRUBIN URINE: NEGATIVE
GLUCOSE, UA: NEGATIVE mg/dL
HGB URINE DIPSTICK: NEGATIVE
Ketones, ur: NEGATIVE mg/dL
Nitrite: NEGATIVE
PH: 5.5 (ref 5.0–8.0)
Protein, ur: NEGATIVE mg/dL
SPECIFIC GRAVITY, URINE: 1.01 (ref 1.005–1.030)

## 2015-12-21 LAB — OSMOLALITY, URINE: Osmolality, Ur: 423 mOsm/kg (ref 300–900)

## 2015-12-21 LAB — BASIC METABOLIC PANEL
Anion gap: 12 (ref 5–15)
Anion gap: 13 (ref 5–15)
Anion gap: 12 (ref 5–15)
BUN: 21 mg/dL — AB (ref 6–20)
BUN: 22 mg/dL — AB (ref 6–20)
BUN: 22 mg/dL — AB (ref 6–20)
CHLORIDE: 73 mmol/L — AB (ref 101–111)
CHLORIDE: 75 mmol/L — AB (ref 101–111)
CHLORIDE: 76 mmol/L — AB (ref 101–111)
CO2: 30 mmol/L (ref 22–32)
CO2: 30 mmol/L (ref 22–32)
CO2: 31 mmol/L (ref 22–32)
CREATININE: 0.98 mg/dL (ref 0.44–1.00)
CREATININE: 0.93 mg/dL (ref 0.44–1.00)
CREATININE: 0.94 mg/dL (ref 0.44–1.00)
Calcium: 9 mg/dL (ref 8.9–10.3)
Calcium: 8.6 mg/dL — ABNORMAL LOW (ref 8.9–10.3)
Calcium: 8.8 mg/dL — ABNORMAL LOW (ref 8.9–10.3)
GFR calc Af Amer: >60 mL/min (ref >60)
GFR calc Af Amer: >60 mL/min (ref >60)
GFR calc Af Amer: >60 mL/min (ref >60)
GFR calc non Af Amer: 52 mL/min — ABNORMAL LOW (ref >60)
GFR calc non Af Amer: 56 mL/min — ABNORMAL LOW (ref >60)
GFR calc non Af Amer: 55 mL/min — ABNORMAL LOW (ref >60)
GLUCOSE: 139 mg/dL — AB (ref 65–99)
GLUCOSE: 145 mg/dL — AB (ref 65–99)
GLUCOSE: 161 mg/dL — AB (ref 65–99)
Potassium: 3.5 mmol/L (ref 3.5–5.1)
Potassium: 3.7 mmol/L (ref 3.5–5.1)
Potassium: 3.3 mmol/L — ABNORMAL LOW (ref 3.5–5.1)
SODIUM: 115 mmol/L — AB (ref 135–145)
SODIUM: 118 mmol/L — AB (ref 135–145)
SODIUM: 119 mmol/L — AB (ref 135–145)

## 2015-12-21 LAB — URINE MICROSCOPIC-ADD ON

## 2015-12-21 LAB — MAGNESIUM: MAGNESIUM: 1.7 mg/dL (ref 1.7–2.4)

## 2015-12-21 MED ORDER — SODIUM CHLORIDE 1 G PO TABS
2.0000 g | ORAL_TABLET | Freq: Three times a day (TID) | ORAL | Status: DC
  Administered 2015-12-21 – 2015-12-23 (×6): 2 g via ORAL
  Filled 2015-12-21 (×8): qty 2

## 2015-12-21 MED ORDER — HYDROMORPHONE HCL 1 MG/ML IJ SOLN
0.5000 mg | Freq: Once | INTRAMUSCULAR | Status: AC
  Administered 2015-12-21: 0.5 mg via INTRAVENOUS
  Filled 2015-12-21: qty 1

## 2015-12-21 MED ORDER — IPRATROPIUM-ALBUTEROL 0.5-2.5 (3) MG/3ML IN SOLN
3.0000 mL | Freq: Three times a day (TID) | RESPIRATORY_TRACT | Status: DC
  Administered 2015-12-21 – 2015-12-24 (×11): 3 mL via RESPIRATORY_TRACT
  Filled 2015-12-21 (×11): qty 3

## 2015-12-21 MED ORDER — TRAMADOL HCL 50 MG PO TABS
50.0000 mg | ORAL_TABLET | Freq: Four times a day (QID) | ORAL | Status: DC | PRN
  Administered 2015-12-21 – 2015-12-22 (×3): 50 mg via ORAL
  Filled 2015-12-21 (×3): qty 1

## 2015-12-21 MED ORDER — DOCUSATE SODIUM 100 MG PO CAPS
100.0000 mg | ORAL_CAPSULE | Freq: Two times a day (BID) | ORAL | Status: DC
  Administered 2015-12-21 – 2015-12-24 (×6): 100 mg via ORAL
  Filled 2015-12-21 (×6): qty 1

## 2015-12-21 NOTE — Progress Notes (Signed)
Admit: 12/16/2015 LOS: 4  40F AoC SOB / COPD exacerbation, new mediastinal mass, worsened hyponatremia  Subjective:  Na level down to 117 0.2L In reported, several voids reported I don't think she is taking in very much solute On lasix and 1gm TID NaCL UOsm 423 this AM Much more awake this AM, oriented to self, location.    Sodium (mmol/L)  Date Value  12/21/2015 117 (LL)  12/20/2015 119 (LL)  12/20/2015 117 (LL)  12/19/2015 119 (LL)  12/19/2015 120 (L)  12/19/2015 119 (LL)  12/18/2015 122 (L)  12/18/2015 122 (L)  12/17/2015 121 (L)  12/17/2015 123 (L)  ]  10/28 0701 - 10/29 0700 In: 200 [P.O.:200] Out: 250 [Urine:250]  Filed Weights   12/19/15 0403 12/20/15 0421 12/21/15 0500  Weight: 65.4 kg (144 lb 2.9 oz) 66.8 kg (147 lb 4.3 oz) 66.8 kg (147 lb 4.3 oz)    Scheduled Meds: . aspirin  81 mg Oral Daily  . aspirin EC  325 mg Oral Once  . enoxaparin (LOVENOX) injection  40 mg Subcutaneous Q24H  . famotidine  10 mg Oral QHS  . furosemide  20 mg Intravenous BID  . ipratropium-albuterol  3 mL Nebulization TID  . levothyroxine  25 mcg Oral QAC breakfast  . mouth rinse  15 mL Mouth Rinse BID  . methylPREDNISolone (SOLU-MEDROL) injection  40 mg Intravenous Q8H  . pantoprazole  40 mg Oral QAC breakfast  . sodium chloride  1 g Oral BID WC   Continuous Infusions:  PRN Meds:.acetaminophen, albuterol, methocarbamol, ondansetron (ZOFRAN) IV  Current Labs: reviewed  U Osm 300 and 600s U Na elevated TSH WNL   Physical Exam:  Blood pressure 93/71, pulse 80, temperature 97 F (36.1 C), temperature source Oral, resp. rate 11, height 5\' 1"  (1.549 m), weight 66.8 kg (147 lb 4.3 oz), SpO2 95 %. NAD, sleeping RRR CTAB No LEE S/nt/nd EOMI NCAT AAO to self only  A 1. Hypotonic Euvolemic hyponatremia likely 2/2 SIADH 1. U Osm inappropriately elevated; U Na elevated consistent with SIADH 2. TSH WNL;GFR WNL 3. Thiazide held 2. New mediastinal mass, ? SCLC 3. COPD  Exacerbatoin 4. Hypothyroidism 5. MDD on SSRI  P 1. Cont fluid restriction -- 558mL/d 2. encourage increased protein / Na intake -- inc NaCl tabs to 2gm TID (though of limited utility here) 3. Cont Trial of lasix even tough U Osm < 600 4. TID BMP 5. Follw U Osm 6. Stop SSRI 7. Consider palliative care involvement  Pearson Grippe MD 12/21/2015, 9:42 AM   Recent Labs Lab 12/19/15 0214  12/20/15 0539 12/20/15 1402 12/21/15 0231  NA 119*  < > 117* 119* 117*  K 3.1*  < > 4.5 3.8 3.5  CL 78*  < > 78* 77* 75*  CO2 31  < > 32 29 31  GLUCOSE 159*  < > 129* 144* 147*  BUN 18  < > 17 16 20   CREATININE 0.87  < > 0.89 0.88 0.87  CALCIUM 8.7*  < > 8.8* 8.8* 8.7*  PHOS 1.7*  --  1.8*  --  2.1*  < > = values in this interval not displayed.  Recent Labs Lab 12/19/15 0214 12/20/15 0539 12/21/15 0231  WBC 6.0 7.9 6.0  NEUTROABS 5.2 6.7 5.2  HGB 13.0 13.4 12.7  HCT 35.7* 36.9 35.7*  MCV 87.1 87.0 86.7  PLT 162 192 183

## 2015-12-21 NOTE — Progress Notes (Addendum)
PROGRESS NOTE    Olivia [name]  MRN:[id number] DOB: 1931/04/28 DOA: 12/16/2015 PCP: Binnie Rail, MD  Brief Narrative:  Olivia [name] is a 82 y.o. female with a past medical history significant for COPD GOLD III, FEV1 47%, hypothyroidism, anxiety, and HTN who presents with significant worsening dyspnea over months and now chest pain. She was found to have a Mediastinal Mass on CT scan. She was transferred to SDU  because of her tenuous respiratory status. Nephrology was consulted for Hyponatremia.   Assessment & Plan:   Principal Problem:   Chronic respiratory failure with hypoxia (HCC) Active Problems:   Essential hypertension   COPD GOLD III with atypical upper airway features    Hypothyroidism   Dyspnea   Chest pain   Mediastinal mass   Hypoxia   Acute on chronic diastolic congestive heart failure (HCC)   SIADH (syndrome of inappropriate ADH production) (HCC)   Hyponatremia  1. Dyspnea and Acute on Chronic Hypercarbic Hypoxic Respiratory likely 2/2 to COPD Exacerbation with Concomitant Pulmonary Edema from Acute Exacerbation of Diastolic CHF:  -Dyspnea out of proportion to CT imaging and SpO2 and labs. -Recent echocardiogram with normal EF of 55-60% but did have Diastolic Dysfunction (grade 1). *Repeat Echo showed  Left ventricle: The cavity size was normal. Wall thickness was increased in a pattern of moderate LVH. Systolic function was normal. The estimated ejection fraction was in the range of 60% to 65%. Wall motion was normal; there were no regional wall motion abnormalities. Doppler parameters are consistent with  abnormal left ventricular relaxation (grade 1 diastolic dysfunction). -Inhalers D/C'd by PCCM -Patient placed on DuoNebs q6h and Albuterol Nebs q2hprn -IV Methylprednisolone 40 mg q6h per Pulmonology -Repeat Chest X-Ray showed The cardiac silhouette is borderline enlarged. Thoracic aortic atherosclerosis is again noted. Subcarinal lymphadenopathy/mass is better  seen on recent CT. Less gaseous distension of hiatal hernia. Lung volumes remain diminished with unchanged bibasilar opacities. No sizable pleural effusion or pneumothorax is identified. -Will try Lasix IV 20 mg BID per Nephro if BP tolerates it -Strict I's and O's -Pulmonary Deferring NIPPV at this time -Patient on SDU because of tenuous respiratory status.  -Appreciate Pulmonary Consultation and additional recommendations -Slowly improving; Patient wanted Albuterol Treatment this AM.  -Palliative Care Consultation placed after discussion with patient's Son  2. Hyponatremia suspected to be SIADH in the Setting of [place] [facility] Consult and Reccomendations -Nephrology thinks its from SIADH from Mediastinal Mass  -Sodium went from 129 -> 123 -> 121-> 119 -> 117 -> 115 -> 118 -PCCM gave patient Lasix IV 40 mg x 1 dose; Will Try IV Lasix 20 mg BID if BP tolerates it. -Urine Osmol today was 423. -Nephrology ordered BMP TID -Uric Acid Level and follow Urine Osmols per Nephrology -Fluid Restrict to 500 mL/day -Salt Tabs 1 gram BID with Meals increased by Nephrology to 1 gram TID -Strict I's and O's  3. Acute Exacerbation of Chronic Diastolic CHF -ECHO showed Left ventricle: The cavity size was normal. Wall thickness was increased in a pattern of moderate LVH. Systolic function was normal. The estimated ejection fraction was in the range of 60% to 65%. Wall motion was normal; there were no regional wall motion abnormalities. Doppler parameters are consistent with  abnormal left ventricular relaxation (grade 1 diastolic dysfunction). -Likely worsened by IVF however patient was Orthostatic -IV Diuresis via Lasix with 20 mg BID if patient tolerates it as she has had tenuous Blood Pressures -Strict I's and O's  and 500 mL/day Fluid Restriction  4. Chest discomfort r/o ACS, improved -PE has been ruled out, as has dissection.   -Unclear that this is not an anginal equivalent  (shortness of breath and nausea).   -Serial enzymes overnight showed <0.03 x 6;  -Transthoracic Echo showed  Left ventricle: The cavity size was normal. Wall thickness was increased in a pattern of moderate LVH. Systolic function was normal. The estimated ejection fraction was in the range of 60% to 65%. Wall motion was normal; there were no regional wall motion abnormalities. Doppler parameters are consistent with  abnormal left ventricular relaxation (grade 1 diastolic dysfunction). -Continue ASA -IV Lasix 20 mg BID  5. COPD with chronic respiratory failure and Underlying Emphysema:  -FEV1 47% most recently.  On O2 since this summer -Treatment as in #1  6. HTN  -Hypotension improved -D/C Chlorthalidone Indefinitely; Hold Metoprolol -Continue aspirin -Will Try IV Lasix  7. Hypothyroidism:  -TSH was 0.470 and Free T4 was 0.89 -Continue Levothyroxine 25 mcg po Before Breakfast  8. Mediastinal mass ? Malignancy:  -Biopsy currently is being planned as outpatient. -Will need PET/CT Imaging as an outpatient -Talked with patient's son at length and will proceed with Palliative Care Consultation  9. Leukopenia and Thrombocytopenia, improved -WBC went from 3.6 -> 3.8 -> 1.9 -> 6.0 -> 7.9 -> 6.0 -Platelets went from 145 -> 151 -> 146 -> 162 -> 192 -> 183 -Unclear etiology  -Repeat CBC in AM  11. GERD -Continue H2RA, PPI  12. Anxiety -Discontinue Paxil as it Alcoser contribute to Hyponatremia  DVT prophylaxis: Lovenox Code Status: Full Family Communication: Discussed Plan of Care with Patient's son and will initiate Palliative Care Consultation. Disposition Plan: Possible SNF vs. Home with Home Health once respiratory status improves and Sodium; Palliative Care to Discuss Goals of Care    Consultants:   PCCM/Pulmonary  Nephrology  Palliative Care Consultation  Procedures: None  Antimicrobials: None  Subjective: Patient seen and examined this AM and she stated she wanted  to sleep. She was up in a chair at bedside. No CP but she stated SOB improved. Discussed plan of Care with Son who came later on and he stated he would like to have a family meeting with Palliative Care involved.   Objective: Vitals:   12/21/15 0429 12/21/15 0500 12/21/15 0727 12/21/15 1501  BP: 93/71     Pulse: 80     Resp: 11     Temp: 97 F (36.1 C)     TempSrc: Oral     SpO2: 98%  95% 95%  Weight:  66.8 kg (147 lb 4.3 oz)    Height:        Intake/Output Summary (Last 24 hours) at 12/21/15 1547 Last data filed at 12/21/15 1009  Gross per 24 hour  Intake                0 ml  Output              550 ml  Net             -550 ml   Filed Weights   12/19/15 0403 12/20/15 0421 12/21/15 0500  Weight: 65.4 kg (144 lb 2.9 oz) 66.8 kg (147 lb 4.3 oz) 66.8 kg (147 lb 4.3 oz)    Examination: Physical Exam:  Constitutional: Thin elderly female in NAD Eyes: Lids and conjunctivae normal, sclerae anicteric  ENMT: External Ears, Nose appear normal. Grossly decreased hearing.  Neck: Appears normal, supple, no cervical  masses, normal ROM, no appreciable thyromegaly, No JVD.  Respiratory: Diminished but no wheezing, rales, or rhonchi. Normal respiratory effort and patient is slightly tachypenic. No accessory muscle use.  Cardiovascular: RRR, no murmurs / rubs / gallops. S1 and S2 auscultated. Very mild extremity edema.  Abdomen: Soft, non-tender, distended 2/2 body habitus. No masses palpated. No appreciable hepatosplenomegaly. Bowel sounds positive x4.  GU: Deferred. Musculoskeletal: No clubbing / cyanosis of digits/nails. No joint deformity upper and lower extremities.  Skin: No rashes, lesions, ulcers. No induration; Warm and dry.  Neurologic: Patient awake and alert but not oriented to place. CN 2-12 grossly intact with no focal deficits. Sensation intact in all 4 Extremities. Romberg sign cerebellar reflexes not assessed.  Psychiatric: Normal judgment and insight. Alert and awake. Normal  mood and appropriate affect.   Data Reviewed: I have personally reviewed following labs and imaging studies  CBC:  Recent Labs Lab 12/17/15 1519 12/18/15 0348 12/19/15 0214 12/20/15 0539 12/21/15 0231  WBC 3.8* 1.9* 6.0 7.9 6.0  NEUTROABS 2.6 1.5* 5.2 6.7 5.2  HGB 12.8 12.9 13.0 13.4 12.7  HCT 36.8 37.0 35.7* 36.9 35.7*  MCV 90.6 90.5 87.1 87.0 86.7  PLT 151 146* 162 192 [id number]   Basic Metabolic Panel:  Recent Labs Lab 12/17/15 1519  12/18/15 0348 12/19/15 0214  12/20/15 0539 12/20/15 1402 12/21/15 0231 12/21/15 1018 12/21/15 1418  NA 123*  < > 122*  122* 119*  < > 117* 119* 117* 115* 118*  K 3.9  --  3.5  3.8 3.1*  < > 4.5 3.8 3.5 3.5 3.7  CL 85*  --  85*  84* 78*  < > 78* 77* 75* 73* 75*  CO2 31  --  28  28 31   < > 32 29 31 30 30   GLUCOSE 96  --  144*  143* 159*  < > 129* 144* 147* 139* 145*  BUN 12  --  12  11 18   < > 17 16 20  21* 22*  CREATININE 0.80  --  0.80  0.76 0.87  < > 0.89 0.88 0.87 0.98 0.93  CALCIUM 8.8*  --  8.5*  8.6* 8.7*  < > 8.8* 8.8* 8.7* 9.0 8.6*  MG 1.4*  --  2.1 1.7  --  1.8  --  1.7  --   --   PHOS 3.2  --  3.3  3.2 1.7*  --  1.8*  --  2.1*  --   --   < > = values in this interval not displayed. GFR: Estimated Creatinine Clearance: 40.8 mL/min (by C-G formula based on SCr of 0.93 mg/dL). Liver Function Tests:  Recent Labs Lab 12/17/15 1519 12/18/15 0348 12/19/15 0214 12/20/15 0539 12/21/15 0231  AST 67* 66*  --   --   --   ALT 34 35  --   --   --   ALKPHOS 70 74  --   --   --   BILITOT 0.8 0.8  --   --   --   PROT 6.4* 6.4*  --   --   --   ALBUMIN 3.1* 3.1*  3.0* 3.4* 3.2* 3.2*   No results for input(s): LIPASE, AMYLASE in the last 168 hours. No results for input(s): AMMONIA in the last 168 hours. Coagulation Profile: No results for input(s): INR, PROTIME in the last 168 hours. Cardiac Enzymes:  Recent Labs Lab 12/17/15 0630 12/17/15 0833 12/17/15 1519 12/17/15 2242 12/18/15 0348  TROPONINI <0.03 <0.03 <0.03  <  0.03 <0.03   BNP (last 3 results)  Recent Labs  09/17/15 1551 12/09/15 1735  PROBNP 119.0* 31.0   HbA1C: No results for input(s): HGBA1C in the last 72 hours. CBG: No results for input(s): GLUCAP in the last 168 hours. Lipid Profile: No results for input(s): CHOL, HDL, LDLCALC, TRIG, CHOLHDL, LDLDIRECT in the last 72 hours. Thyroid Function Tests: No results for input(s): TSH, T4TOTAL, FREET4, T3FREE, THYROIDAB in the last 72 hours. Anemia Panel: No results for input(s): VITAMINB12, FOLATE, FERRITIN, TIBC, IRON, RETICCTPCT in the last 72 hours. Sepsis Labs:  Recent Labs Lab 12/17/15 0015 12/18/15 0348 12/20/15 0539  PROCALCITON <0.10 0.11 <0.10    Recent Results (from the past 240 hour(s))  MRSA PCR Screening     Status: None   Collection Time: 12/17/15 11:10 PM  Result Value Ref Range Status   MRSA by PCR NEGATIVE NEGATIVE Final    Comment:        The GeneXpert MRSA Assay (FDA approved for NASAL specimens only), is one component of a comprehensive MRSA colonization surveillance program. It is not intended to diagnose MRSA infection nor to guide or monitor treatment for MRSA infections.     Radiology Studies: Dg Chest Port 1 View  Result Date: 12/20/2015 CLINICAL DATA:  Respiratory failure EXAM: PORTABLE CHEST 1 VIEW COMPARISON:  12/18/2015 FINDINGS: Interval improvement in bibasilar atelectasis. Negative for heart failure or effusion. No pneumothorax. IMPRESSION: Improvement in bibasilar atelectasis Electronically Signed   By: Franchot Gallo M.D.   On: 12/20/2015 09:35   Scheduled Meds: . aspirin  81 mg Oral Daily  . aspirin EC  325 mg Oral Once  . enoxaparin (LOVENOX) injection  40 mg Subcutaneous Q24H  . famotidine  10 mg Oral QHS  . furosemide  20 mg Intravenous BID  . ipratropium-albuterol  3 mL Nebulization TID  . levothyroxine  25 mcg Oral QAC breakfast  . mouth rinse  15 mL Mouth Rinse BID  . methylPREDNISolone (SOLU-MEDROL) injection  40 mg  Intravenous Q8H  . pantoprazole  40 mg Oral QAC breakfast  . sodium chloride  2 g Oral TID WC   Continuous Infusions:    LOS: 4 days   Kerney Elbe, DO [facility] Pager [phone]  If 7PM-7AM, please contact night-coverage [email or web address] Password [email or web address] 12/21/2015, 3:47 PM

## 2015-12-21 NOTE — Progress Notes (Signed)
CRITICAL VALUE ALERT  Critical value received:  Sodium 117  Date of notification:  12/21/15  Time of notification:  0337  Critical value read back:YES  Nurse who received alert:  Sherryl Manges, RN  MD notified (1st page): Maudie Mercury, MD  Time of first page:  [phone]  MD notified (2nd page):  Time of second page:  Responding MD:    Time MD responded:

## 2015-12-22 DIAGNOSIS — J9611 Chronic respiratory failure with hypoxia: Secondary | ICD-10-CM

## 2015-12-22 DIAGNOSIS — E876 Hypokalemia: Secondary | ICD-10-CM

## 2015-12-22 LAB — RENAL FUNCTION PANEL
ANION GAP: 10 (ref 5–15)
Albumin: 3.3 g/dL — ABNORMAL LOW (ref 3.5–5.0)
BUN: 24 mg/dL — ABNORMAL HIGH (ref 6–20)
CALCIUM: 8.7 mg/dL — AB (ref 8.9–10.3)
CHLORIDE: 75 mmol/L — AB (ref 101–111)
CO2: 35 mmol/L — AB (ref 22–32)
Creatinine, Ser: 1.03 mg/dL — ABNORMAL HIGH (ref 0.44–1.00)
GFR calc non Af Amer: 49 mL/min — ABNORMAL LOW (ref >60)
GFR, EST AFRICAN AMERICAN: 57 mL/min — AB (ref >60)
GLUCOSE: 147 mg/dL — AB (ref 65–99)
POTASSIUM: 3.2 mmol/L — AB (ref 3.5–5.1)
Phosphorus: 2.2 mg/dL — ABNORMAL LOW (ref 2.5–4.6)
SODIUM: 120 mmol/L — AB (ref 135–145)

## 2015-12-22 LAB — CBC WITH DIFFERENTIAL/PLATELET
Basophils Absolute: 0 10*3/uL (ref 0.0–0.1)
Basophils Relative: 0 %
EOS ABS: 0 10*3/uL (ref 0.0–0.7)
EOS PCT: 0 %
HCT: 38.5 % (ref 36.0–46.0)
HEMOGLOBIN: 13.9 g/dL (ref 12.0–15.0)
LYMPHS ABS: 0.6 10*3/uL — AB (ref 0.7–4.0)
LYMPHS PCT: 8 %
MCH: 31.5 pg (ref 26.0–34.0)
MCHC: 36.1 g/dL — AB (ref 30.0–36.0)
MCV: 87.3 fL (ref 78.0–100.0)
MONOS PCT: 5 %
Monocytes Absolute: 0.3 10*3/uL (ref 0.1–1.0)
NEUTROS PCT: 87 %
Neutro Abs: 6 10*3/uL (ref 1.7–7.7)
Platelets: 214 10*3/uL (ref 150–400)
RBC: 4.41 MIL/uL (ref 3.87–5.11)
RDW: 13.6 % (ref 11.5–15.5)
WBC: 6.9 10*3/uL (ref 4.0–10.5)

## 2015-12-22 LAB — MAGNESIUM: Magnesium: 1.8 mg/dL (ref 1.7–2.4)

## 2015-12-22 LAB — BASIC METABOLIC PANEL
Anion gap: 12 (ref 5–15)
BUN: 23 mg/dL — AB (ref 6–20)
CHLORIDE: 75 mmol/L — AB (ref 101–111)
CO2: 35 mmol/L — ABNORMAL HIGH (ref 22–32)
CREATININE: 0.91 mg/dL (ref 0.44–1.00)
Calcium: 8.9 mg/dL (ref 8.9–10.3)
GFR calc Af Amer: >60 mL/min (ref >60)
GFR calc non Af Amer: 57 mL/min — ABNORMAL LOW (ref >60)
GLUCOSE: 129 mg/dL — AB (ref 65–99)
Potassium: 3.1 mmol/L — ABNORMAL LOW (ref 3.5–5.1)
SODIUM: 122 mmol/L — AB (ref 135–145)

## 2015-12-22 MED ORDER — POTASSIUM CHLORIDE 20 MEQ PO PACK
40.0000 meq | PACK | Freq: Two times a day (BID) | ORAL | Status: DC
Start: 2015-12-22 — End: 2015-12-23
  Administered 2015-12-22 (×2): 40 meq via ORAL
  Filled 2015-12-22 (×3): qty 2

## 2015-12-22 MED ORDER — METHYLPREDNISOLONE SODIUM SUCC 40 MG IJ SOLR
40.0000 mg | Freq: Two times a day (BID) | INTRAMUSCULAR | Status: DC
  Administered 2015-12-22 – 2015-12-23 (×3): 40 mg via INTRAVENOUS
  Filled 2015-12-22 (×3): qty 1

## 2015-12-22 NOTE — Progress Notes (Signed)
Name: Olivia [name] MRN: [id number] DOB: 1931/11/03    ADMISSION DATE:  12/16/2015 CONSULTATION DATE:  12/16/15  REFERRING MD :  Dr. Alfredia Ferguson  CHIEF COMPLAINT:  SOB    SUBJECTIVE:  Breathing much improved compared to last week, she feels better than baseline infact.  Now down to 2L via [place].  No chest pain, SOB.  VITAL SIGNS: Temp:  [97.7 F (36.5 C)-97.9 F (36.6 C)] 97.8 F (36.6 C) (10/30 0855) Pulse Rate:  [84-101] 95 (10/30 0855) Resp:  [14-28] 18 (10/30 0855) BP: (93-136)/(68-92) 132/89 (10/30 0600) SpO2:  [92 %-100 %] 92 % (10/30 0855) Weight:  [145 lb 15.1 oz (66.2 kg)] 145 lb 15.1 oz (66.2 kg) (10/30 0500)  PHYSICAL EXAMINATION: General:  Frail elderly female in NAD, resting in bed watching TV Neuro:  A&O x 3, no focal deficits HEENT:  [place]/AT, PERRL, no JVD Cardiovascular:  RRR, no MRG. No edema Lungs:  resps even non labored on [place], speaks in full sentences, Poor air movement Abdomen:  Soft, non-tender, non-distended Musculoskeletal:  No acute deformity. Skin:  Grossly intact, tenting   Recent Labs Lab 12/21/15 1747 12/22/15 0231 12/22/15 0741  NA 119* 120* 122*  K 3.3* 3.2* 3.1*  CL 76* 75* 75*  CO2 31 35* 35*  BUN 22* 24* 23*  CREATININE 0.94 1.03* 0.91  GLUCOSE 161* 147* 129*    Recent Labs Lab 12/20/15 0539 12/21/15 0231 12/22/15 0231  HGB 13.4 12.7 13.9  HCT 36.9 35.7* 38.5  WBC 7.9 6.0 6.9  PLT 192 183 214   No results found. SIGNIFICANT EVENTS  10/24  Admit with SOB, found to have a mediastinal mass, LAN   STUDIES:  CT Chest 10/24 >> negative for PE but showed a 3.5 x 2.8 cm sub carinal mass, mediastinal adenopathy and mild emphysematous disease in the upper lobes bilaterally.  2 D Echo>> Pending 10/27  ASSESSMENT / PLAN:  Acute hypoxemic/hypercarbic respiratory failure secondary to COPD(GOLD III, O2 dependent) with questionable exacerbation vs. pulmonary edema related to CHF. Also some abdominal distension and nausea. Plan: Continue  supplemental O2 to target SpO2  88-92% ( 4 L 10/27) Continue BD's. Defer BiPAP for now Intermittent CXR. Continue diuresis per primary team and nephrology. Continue solumedrol, dropped from TID to BID 10/30.  Mediastinal Mass with associated LAN - worrisome for malignancy. Plan: Per primary team notes, palliative care consult pending (agree with this route).  Pt. Has appointments scheduled with [facility] for follow up 12/24/15 with Rexene Edison NP ( if patient has been discharged at that time), and then 01/07/2016 with Dr. Lake Bells.   Rest per primary team.   Montey Hora, PA - [facility] Pager: [phone]  or [phone] 12/22/2015, 10:35 AM   STAFF NOTE: I, Merrie Roof, MD FACP have personally reviewed patient's available data, including medical history, events of note, physical examination and test results as part of my evaluation. I have discussed with resident/NP and other care providers such as pharmacist, RN and RRT. In addition, I personally evaluated patient and elicited key findings of: no distres, reports feeling less sob, pcxr is improved edema, CT reviewed with subcarinal node / mass, she is weak and debilitated with advanced stage 3 COPD, would be a poor candidate for ebus, fortunately her symotoms are responding to lasix well and we should continued this as tolerated and then have her follow up as outpt, also pending pall care meetings result, her  pain needs more control also, would favor diuresis and then dc then follo wup with [place] in office shcih fortunately is coming soon athen decisions can be made as far as Bx, although would not favor this approach  Lavon Paganini. Titus Mould, MD, [place] Pgr: [facility] 12/22/2015 11:56 AM

## 2015-12-22 NOTE — Care Management Important Message (Signed)
Important Message  Patient Details  Name: Olivia [name] MRN: [id number] Date of Birth: 1931/10/28   Medicare Important Message Given:  Yes    [name] 12/22/2015, 5:30 PM

## 2015-12-22 NOTE — Progress Notes (Signed)
PROGRESS NOTE    Olivia [name]  MRN:[id number] DOB: Mar 17, 1931 DOA: 12/16/2015 PCP: Binnie Rail, MD  Brief Narrative:  Olivia [name] is a 82 y.o. female with a past medical history significant for COPD GOLD III, FEV1 47%, hypothyroidism, anxiety, and HTN who presents with significant worsening dyspnea over months and now chest pain. She was found to have a Mediastinal Mass on CT scan. She was transferred to SDU  because of her tenuous respiratory status. Nephrology was consulted for Hyponatremia.   Assessment & Plan:   Principal Problem:   Chronic respiratory failure with hypoxia (HCC) Active Problems:   Essential hypertension   COPD GOLD III with atypical upper airway features    Hypothyroidism   Dyspnea   Chest pain   Mediastinal mass   Hypoxia   Acute on chronic diastolic congestive heart failure (HCC)   SIADH (syndrome of inappropriate ADH production) (HCC)   Hyponatremia  1. Dyspnea and Acute on Chronic Hypercarbic Hypoxic Respiratory likely 2/2 to COPD Exacerbation with Concomitant Pulmonary Edema from Acute Exacerbation of Diastolic CHF:  -Currently Saturating well with 2 Liters of O2 via [place] -Recent echocardiogram with normal EF of 55-60% but did have Diastolic Dysfunction (grade 1). *Repeat Echo showed  Left ventricle: The cavity size was normal. Wall thickness was increased in a pattern of moderate LVH. Systolic function was normal. The estimated ejection fraction was in the range of 60% to 65%. Wall motion was normal; there were no regional wall motion abnormalities. Doppler parameters are consistent with  abnormal left ventricular relaxation (grade 1 diastolic dysfunction). -Inhalers D/C'd by PCCM -Patient placed on DuoNebs q6h and Albuterol Nebs q2hprn -IV Methylprednisolone 40 mg q6h decreased to IV 40 mg q12h per Pulmonology -Repeat Chest X-Ray showed The cardiac silhouette is borderline enlarged. Thoracic aortic atherosclerosis is again noted. Subcarinal  lymphadenopathy/mass is better seen on recent CT. Less gaseous distension of hiatal hernia. Lung volumes remain diminished with unchanged bibasilar opacities. No sizable pleural effusion or pneumothorax is identified. -C/w Lasix IV 20 mg BID per Nephro if BP tolerates it -Strict I's and O's -Pulmonary Deferring NIPPV at this time -Patient on SDU because of tenuous respiratory status.  -Appreciate Pulmonary Consultation and additional recommendations  -Palliative Care Consultation and Meeting tomorrow at 2:00 pm -Follow Ups with [facility] 12/24/15 with Rexene Edison, NP and with Dr. Lake Bells on 01/07/16  2. Hyponatremia suspected to be SIADH in the Setting of Lung Mass, improving -Appreciate Nephrology Consult and Reccomendations -Nephrology thinks its from SIADH from Mediastinal Mass  -Sodium went from 129 -> 123 -> 121-> 119 -> 117 -> 115 -> 118 -> 122 -IV Lasix 20 mg BID if BP tolerates it. -Nephrology ordered BMP TID -Uric Acid Level and follow Urine Osmols per Nephrology -Fluid Restrict to 500 mL/day -Salt Tabs TID -Strict I's and O's -Repeat Renal Fxn in AM  3. Acute Exacerbation of Chronic Diastolic CHF -ECHO showed Left ventricle: The cavity size was normal. Wall thickness was increased in a pattern of moderate LVH. Systolic function was normal. The estimated ejection fraction was in the range of 60% to 65%. Wall motion was normal; there were no regional wall motion abnormalities. Doppler parameters are consistent with  abnormal left ventricular relaxation (grade 1 diastolic dysfunction). -Likely worsened by IVF however patient was Orthostatic -IV Diuresis via Lasix with 20 mg BID if patient tolerates it as she has had tenuous Blood Pressures -Strict I's and O's and 500 mL/day Fluid Restriction  4. Hypokalemia -Patient's  K+ was 3.1 today -[place]ik[place] 2/2 to IV Lasix Administration -Repleting with Potassium Chloride 40 mEQ po BID -Repeat Potassium Level  5. Chronic Back Pain  with Left Lumbar Radiculopathy -C/w Robaxin 500 mg po q6hprn  6, Chest discomfort r/o ACS, improved -PE has been ruled out, as has dissection.   -Unclear that this is not an anginal equivalent (shortness of breath and nausea).   -Serial enzymes overnight showed <0.03 x 6;  -Transthoracic Echo showed  Left ventricle: The cavity size was normal. Wall thickness was increased in a pattern of moderate LVH. Systolic function was normal. The estimated ejection fraction was in the range of 60% to 65%. Wall motion was normal; there were no regional wall motion abnormalities. Doppler parameters are consistent with  abnormal left ventricular relaxation (grade 1 diastolic dysfunction). -Continue ASA -IV Lasix 20 mg BID  7. COPD with chronic respiratory failure and Underlying Emphysema:  -FEV1 47% most recently.  On O2 since this summer -Treatment as in #1  8. HTN  -Hypotension improved -D/C Chlorthalidone Indefinitely; Hold Metoprolol -Continue Aspirin 81 mg po Daily -C/w IV Lasix if patient able to tolerate it  9. Hypothyroidism:  -TSH was 0.470 and Free T4 was 0.89 -Continue Levothyroxine 25 mcg po Before Breakfast  10. Mediastinal mass ? Malignancy:  -Biopsy currently is being planned as outpatient. -Will need PET/CT Imaging as an outpatient -Per Pulmonary patient is a poor candidate for EBUS -Palliative Care Consultation to take place  11. GERD -Continue Famotidine 10 mg po Daily and Protonix 40 mg po   12. Anxiety -Discontinue Paxil as it Dowdy contribute to Hyponatremia  DVT prophylaxis: Lovenox Code Status: Full Family Communication: No Family at Bedside Disposition Plan: Possible SNF vs. Home with Home Health once respiratory status improves and Sodium; Palliative Care to Discuss Goals of Care    Consultants:   PCCM/Pulmonary  Nephrology  Palliative Care Consultation  Procedures: None  Antimicrobials: None  Subjective: Patient seen and examined this AM and she was  complaining of chronic Left Hip Pain and described the pain going down her leg. Denied an CP/N/V/Lightheadedness or dizziness. No other concerns or complaints at this time.   Objective: Vitals:   12/22/15 0755 12/22/15 0759 12/22/15 0855 12/22/15 1523  BP:      Pulse:   95 97  Resp:   18 (!) 24  Temp:   97.8 F (36.6 C)   TempSrc:   Axillary   SpO2: 95% 95% 92% 97%  Weight:      Height:        Intake/Output Summary (Last 24 hours) at 12/22/15 1739 Last data filed at 12/21/15 2133  Gross per 24 hour  Intake              120 ml  Output                0 ml  Net              120 ml   Filed Weights   12/20/15 0421 12/21/15 0500 12/22/15 0500  Weight: 66.8 kg (147 lb 4.3 oz) 66.8 kg (147 lb 4.3 oz) 66.2 kg (145 lb 15.1 oz)    Examination: Physical Exam:  Constitutional: Thin elderly female in NAD Eyes: Lids and conjunctivae normal, sclerae anicteric  ENMT: External Ears, Nose appear normal. Grossly decreased hearing.  Neck: Appears normal, supple, no cervical masses, normal ROM, no appreciable thyromegaly, No JVD.  Respiratory: Diminished but no wheezing, rales, or rhonchi. Normal respiratory  effort and patient is slightly tachypenic. No accessory muscle use.  Cardiovascular: RRR, no murmurs / rubs / gallops. S1 and S2 auscultated. No Appreciable LE edema.  Abdomen: Soft, non-tender, distended 2/2 body habitus. No masses palpated. No appreciable hepatosplenomegaly. Bowel sounds positive x4.  GU: Deferred. Musculoskeletal: No clubbing / cyanosis of digits/nails. No joint deformity upper and lower extremities.  Skin: No rashes, lesions, ulcers. No induration; Warm and dry.  Neurologic: Patient awake and more alert today. CN 2-12 grossly intact with no focal deficits. Sensation intact in all 4 Extremities. Romberg sign cerebellar reflexes not assessed.  Psychiatric: Normal judgment and insight. Alert and awake. Normal mood and appropriate affect.   Data Reviewed: I have personally  reviewed following labs and imaging studies  CBC:  Recent Labs Lab 12/18/15 0348 12/19/15 0214 12/20/15 0539 12/21/15 0231 12/22/15 0231  WBC 1.9* 6.0 7.9 6.0 6.9  NEUTROABS 1.5* 5.2 6.7 5.2 6.0  HGB 12.9 13.0 13.4 12.7 13.9  HCT 37.0 35.7* 36.9 35.7* 38.5  MCV 90.5 87.1 87.0 86.7 87.3  PLT 146* 162 192 183 [id number]   Basic Metabolic Panel:  Recent Labs Lab 12/18/15 0348 12/19/15 0214  12/20/15 0539  12/21/15 0231 12/21/15 1018 12/21/15 1418 12/21/15 1747 12/22/15 0231 12/22/15 0741  NA 122*  122* 119*  < > 117*  < > 117* 115* 118* 119* 120* 122*  K 3.5  3.8 3.1*  < > 4.5  < > 3.5 3.5 3.7 3.3* 3.2* 3.1*  CL 85*  84* 78*  < > 78*  < > 75* 73* 75* 76* 75* 75*  CO2 28  28 31   < > 32  < > 31 30 30 31  35* 35*  GLUCOSE 144*  143* 159*  < > 129*  < > 147* 139* 145* 161* 147* 129*  BUN 12  11 18   < > 17  < > 20 21* 22* 22* 24* 23*  CREATININE 0.80  0.76 0.87  < > 0.89  < > 0.87 0.98 0.93 0.94 1.03* 0.91  CALCIUM 8.5*  8.6* 8.7*  < > 8.8*  < > 8.7* 9.0 8.6* 8.8* 8.7* 8.9  MG 2.1 1.7  --  1.8  --  1.7  --   --   --  1.8  --   PHOS 3.3  3.2 1.7*  --  1.8*  --  2.1*  --   --   --  2.2*  --   < > = values in this interval not displayed. GFR: Estimated Creatinine Clearance: 41.5 mL/min (by C-G formula based on SCr of 0.91 mg/dL). Liver Function Tests:  Recent Labs Lab 12/17/15 1519 12/18/15 0348 12/19/15 0214 12/20/15 0539 12/21/15 0231 12/22/15 0231  AST 67* 66*  --   --   --   --   ALT 34 35  --   --   --   --   ALKPHOS 70 74  --   --   --   --   BILITOT 0.8 0.8  --   --   --   --   PROT 6.4* 6.4*  --   --   --   --   ALBUMIN 3.1* 3.1*  3.0* 3.4* 3.2* 3.2* 3.3*   No results for input(s): LIPASE, AMYLASE in the last 168 hours. No results for input(s): AMMONIA in the last 168 hours. Coagulation Profile: No results for input(s): INR, PROTIME in the last 168 hours. Cardiac Enzymes:  Recent Labs Lab 12/17/15 0630 12/17/15 [id number]  12/17/15 1519 12/17/15 2242  12/18/15 0348  TROPONINI <0.03 <0.03 <0.03 <0.03 <0.03   BNP (last 3 results)  Recent Labs  09/17/15 1551 12/09/15 1735  PROBNP 119.0* 31.0   HbA1C: No results for input(s): HGBA1C in the last 72 hours. CBG: No results for input(s): GLUCAP in the last 168 hours. Lipid Profile: No results for input(s): CHOL, HDL, LDLCALC, TRIG, CHOLHDL, LDLDIRECT in the last 72 hours. Thyroid Function Tests: No results for input(s): TSH, T4TOTAL, FREET4, T3FREE, THYROIDAB in the last 72 hours. Anemia Panel: No results for input(s): VITAMINB12, FOLATE, FERRITIN, TIBC, IRON, RETICCTPCT in the last 72 hours. Sepsis Labs:  Recent Labs Lab 12/17/15 0015 12/18/15 0348 12/20/15 0539  PROCALCITON <0.10 0.11 <0.10    Recent Results (from the past 240 hour(s))  MRSA PCR Screening     Status: None   Collection Time: 12/17/15 11:10 PM  Result Value Ref Range Status   MRSA by PCR NEGATIVE NEGATIVE Final    Comment:        The GeneXpert MRSA Assay (FDA approved for NASAL specimens only), is one component of a comprehensive MRSA colonization surveillance program. It is not intended to diagnose MRSA infection nor to guide or monitor treatment for MRSA infections.   Culture, Urine     Status: Abnormal (Preliminary result)   Collection Time: 12/21/15 12:29 PM  Result Value Ref Range Status   Specimen Description URINE, CLEAN CATCH  Final   Special Requests NONE  Final   Culture >=100,000 COLONIES/mL ESCHERICHIA COLI (A)  Final   Report Status PENDING  Incomplete    Radiology Studies: No results found. Scheduled Meds: . aspirin  81 mg Oral Daily  . aspirin EC  325 mg Oral Once  . docusate sodium  100 mg Oral BID  . enoxaparin (LOVENOX) injection  40 mg Subcutaneous Q24H  . famotidine  10 mg Oral QHS  . furosemide  20 mg Intravenous BID  . ipratropium-albuterol  3 mL Nebulization TID  . levothyroxine  25 mcg Oral QAC breakfast  . mouth rinse  15 mL Mouth Rinse BID  . methylPREDNISolone  (SOLU-MEDROL) injection  40 mg Intravenous Q12H  . pantoprazole  40 mg Oral QAC breakfast  . potassium chloride  40 mEq Oral BID  . sodium chloride  2 g Oral TID WC   Continuous Infusions:    LOS: 4 days   Kerney Elbe, DO [facility] Pager [phone]  If 7PM-7AM, please contact night-coverage [email or web address] Password [email or web address] 12/22/2015, 5:39 PM

## 2015-12-22 NOTE — Progress Notes (Signed)
Patient ID: Olivia [name], female   DOB: [birth year], 82 y.o.   MRN: [id number]  [facility] Progress Note   Assessment/ Plan:   1. 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 Sodium level improving with fluid restriction and encouraging efforts at increasing caloric/protein intake as well as discontinuation of SSRI. Continue furosemide at this time-convert to oral furosemide in 24-48 hours. I called the pharmacy but they do not have the oral urea supplement (Ure-Na) which is potentially useful in a situation such as her hers. 2. New mediastinal mass: With her advanced age and frailty-would recommend leaning more towards palliative care. 3. Hyponatremia/hypochloremia: Associated with an potentially perpetuating hyponatremia, replete with oral potassium  Subjective:   Reports to be feeling fair-improved shortness of breath after she got nebulizer this morning. Denies any chest pain and complains of poor appetite/dry throat.    Objective:   BP 132/89   Pulse 95   Temp 97.8 F (36.6 C) (Axillary)   Resp 18   Ht 5\' 1"  (1.549 m)   Wt 66.2 kg (145 lb 15.1 oz)   SpO2 92%   BMI 27.58 kg/m   Intake/Output Summary (Last 24 hours) at 12/22/15 0912 Last data filed at 12/21/15 2133  Gross per 24 hour  Intake              420 ml  Output              450 ml  Net              -30 ml   Weight change: -0.6 kg (-1 lb 5.2 oz)  Physical Exam: [id number] resting in bed, watching curious Iona Beard CVS: Pulse regular rhythm, S1 and S2 normal Resp: Coarse breath sounds bilaterally with occasional expiratory wheeze Abd: Soft, flat, nontender Ext: No lower extremity edema  Imaging: No results found.  Labs: BMET  Recent Labs Lab 12/17/15 1519  12/18/15 0348 12/19/15 0214  12/20/15 0539 12/20/15 1402 12/21/15 0231 12/21/15 1018 12/21/15 1418 12/21/15 1747 12/22/15 0231  12/22/15 0741  NA 123*  < > 122*  122* 119*  < > 117* 119* 117* 115* 118* 119* 120* 122*  K 3.9  --  3.5  3.8 3.1*  < > 4.5 3.8 3.5 3.5 3.7 3.3* 3.2* 3.1*  CL 85*  --  85*  84* 78*  < > 78* 77* 75* 73* 75* 76* 75* 75*  CO2 31  --  28  28 31   < > 32 29 31 30 30 31  35* 35*  GLUCOSE 96  --  144*  143* 159*  < > 129* 144* 147* 139* 145* 161* 147* 129*  BUN 12  --  12  11 18   < > 17 16 20  21* 22* 22* 24* 23*  CREATININE 0.80  --  0.80  0.76 0.87  < > 0.89 0.88 0.87 0.98 0.93 0.94 1.03* 0.91  CALCIUM 8.8*  --  8.5*  8.6* 8.7*  < > 8.8* 8.8* 8.7* 9.0 8.6* 8.8* 8.7* 8.9  PHOS 3.2  --  3.3  3.2 1.7*  --  1.8*  --  2.1*  --   --   --  2.2*  --   < > = values in this interval not displayed. CBC  Recent Labs Lab 12/19/15 0214 12/20/15 0539 12/21/15 0231 12/22/15 0231  WBC 6.0 7.9 6.0 6.9  NEUTROABS 5.2 6.7 5.2 6.0  HGB 13.0 13.4 12.7 13.9  HCT 35.7* 36.9 35.7* 38.5  MCV 87.1 87.0 86.7 87.3  PLT 162 192 183 214    Medications:    . aspirin  81 mg Oral Daily  . aspirin EC  325 mg Oral Once  . docusate sodium  100 mg Oral BID  . enoxaparin (LOVENOX) injection  40 mg Subcutaneous Q24H  . famotidine  10 mg Oral QHS  . furosemide  20 mg Intravenous BID  . ipratropium-albuterol  3 mL Nebulization TID  . levothyroxine  25 mcg Oral QAC breakfast  . mouth rinse  15 mL Mouth Rinse BID  . methylPREDNISolone (SOLU-MEDROL) injection  40 mg Intravenous Q8H  . pantoprazole  40 mg Oral QAC breakfast  . potassium chloride  40 mEq Oral BID  . sodium chloride  2 g Oral TID WC   Elmarie Shiley, MD 12/22/2015, 9:12 AM

## 2015-12-22 NOTE — Progress Notes (Signed)
Thank you for consulting the Palliative Medicine Team at [facility] to meet your patient's and family's needs.   The reason that you asked [place] to see your patient is for:  Establish GOC  We have scheduled your patient for a meeting: Tomorrow at 2 pm, son and daughter will be present    Wadie Lessen NP  Palliative Medicine Team Team Phone # [phone] Pager [phone]

## 2015-12-22 NOTE — Care Management Important Message (Signed)
Important Message  Patient Details  Name: Olivia [name] MRN: [id number] Date of Birth: [birth year]   Medicare Important Message Given:  Yes    [name] Montine Circle 12/22/2015, 5:24 PM

## 2015-12-22 NOTE — Consult Note (Signed)
Consultation Note Date: 12/22/2015   Patient Name: Olivia [name]  DOB: 19-Dec-1931  MRN: [id number]  Age / Sex: 82 y.o., female  PCP: Binnie Rail, MD Referring Physician: Kerney Elbe, DO  Reason for Consultation: Establishing goals of care and Psychosocial/spiritual support  HPI/Patient Profile: 82 y.o. female   admitted on 12/16/2015 with a past medical history significant for COPD GOLD III, FEV1 47%, hypothyroidism, anxiety, and HTN who presents with worsening dyspnea over months and now chest pain.   Per family she has had a rapid physical and functional decline over the past 4 weeks  She had to limit her daily activities (couldn't walk around much), was using her albuterol 2-3 times per day.   -Some adjustments were made to her inhalers, tweaks to her GERD medicines --> no improvement.  -9/29 did a 6-day prednisone taper without improvement, only worsening and needing albuterol every few hours  -10/17 O2 sat while ambulating down to 84%, started on home O2  On 10/24 she woke up complaining of her daughter of "chest tightness" that was constant, associated with nausea, and further worsening of her shortness of breath.  Her family arranged for her to be seen in [facility] clinic, and she was referred from there to the ER.  ED course: -Afebrile, heart rates 46-50s, respirations normal, pulse ox normal on 2L [place], BP fluctuated [id number] systolic -Na [id number], K 3.6, Cr 0.9 (baseline 1.4), WBC 3.6K, Hgb 12.3, troponin negative, BNP normal  -CXR unremarkable, CTA chest showed a new 2cm mediastinal mass and suspicious lymphadenopathy, as well as emphysema, some mild atelectasis, but no effusions, pneumonia, or PE  -ECG showed old bradycardia only  -Initially, arrangements were made to discharge the patient with outpatient Pulm follow up for bronch and biopsy of her new mediastinal mass but subsequently the  patient began to complain of severe shortness of breath, unable to sit still, chest tightness and so [facility] were called again to evaluate the patient for admission  She complained of nausea, severe shortness of breath, only relieved by sitting straight up or somewhat forward, dry mouth "so dry I think I'm dying".  She has continued with no improvement , continued hyponatremia and intermittent confusion.  Patient and family are faced with advanced directive decisions and anticipatory care needs.   Clinical Assessment and Goals of Care:  This NP Wadie Lessen reviewed medical records, received report from team, assessed the patient and then meet at the patient's bedside along with her son/Dan Lal  (although there is no documented HPOA today the patient voices her desire for her son to make health care decisions in the events she cannot) to discuss diagnosis, prognosis, GOC, EOL wishes disposition and options.  A detailed discussion was had today regarding advanced directives.  Concepts specific to code status, artifical feeding and hydration, continued IV antibiotics and rehospitalization was had.  The difference between a aggressive medical intervention path  and a palliative comfort care path for this patient at this time was had.  Values and goals  of care important to patient and family were attempted to be elicited.  MOST form introduced  Concept of Hospice and Palliative Care were discussed  Natural trajectory and expectations at EOL were discussed.  Questions and concerns addressed.   Family encouraged to call with questions or concerns.  PMT will continue to support holistically.     SUMMARY OF RECOMMENDATIONS    - patient has made the decision for a full comfort path, focus is comfort quality and dignity   Code Status/Advance Care Planning:  DNR--documented today   Symptom Management:    Morphine 1 mg IV every 1 hr prn  Anxiety: Ativan 1 mg IV every 4 hrs prn  Palliative  Prophylaxis:   Bowel Regimen, Frequent Pain Assessment and Oral Care  Additional Recommendations (Limitations, Scope, Preferences):  Full Comfort Care, No Artificial Feeding, No Blood Transfusions, No Chemotherapy, No Diagnostics, No Glucose Monitoring, No IV Antibiotics, No IV Fluids, No Lab Draws and No Surgical Procedures  Psycho-social/Spiritual:   Desire for further Chaplaincy support:no  Additional Recommendations: Education on Hospice  Prognosis:  < 2 weeks - will re-evaluate in the morning  Discharge Planning: To Be Determined      Primary Diagnoses: Present on Admission: . Mediastinal mass . Dyspnea . Chest pain . Essential hypertension . COPD GOLD III with atypical upper airway features  . Hypothyroidism . Chronic respiratory failure with hypoxia ([place])   I have reviewed the medical record, interviewed the patient and family, and examined the patient. The following aspects are pertinent.  Past Medical History:  Diagnosis Date  . Anxiety   . Cancer ([place])    squamous cell rectal area  . COPD (chronic obstructive pulmonary disease) ([place])   . GERD (gastroesophageal reflux disease)    occ  . Heart murmur   . History of recurrent UTIs   . Hyperlipidemia   . Hypertension   . Hypothyroidism   . Left lumbar radiculopathy   . Myopathy   . Osteoarthritis   . Pneumonia 1/09   hx  . Shortness of breath    Social History   Social History  . Marital status: Widowed    Spouse name: N/A  . Number of children: N/A  . Years of education: N/A   Social History Main Topics  . Smoking status: Former Smoker    Packs/day: 1.00    Years: 57.00    Types: Cigarettes    Quit date: 02/26/2007  . Smokeless tobacco: Never Used     Comment: smoked age 8- 42 up to 1 ppd  occ alcohol  . Alcohol use Yes  . Drug use: No  . Sexual activity: Not Asked   Other Topics Concern  . None   Social History Narrative  . None   Family History  Problem Relation Age of Onset  .  Arthritis Mother   . Diabetes Mother   . Heart disease Mother     MI @ 28  . Kidney disease Father   . Heart disease Father     Rheumatic Heart Disease  . Asthma Sister   . Cancer Brother     brother breast cancer, melanoma  . Stroke Maternal Grandfather     ? age  . Stroke Paternal Grandfather 60   Scheduled Meds: . aspirin  81 mg Oral Daily  . aspirin EC  325 mg Oral Once  . docusate sodium  100 mg Oral BID  . enoxaparin (LOVENOX) injection  40 mg Subcutaneous Q24H  . famotidine  10 mg Oral QHS  . furosemide  20 mg Intravenous BID  . ipratropium-albuterol  3 mL Nebulization TID  . levothyroxine  25 mcg Oral QAC breakfast  . mouth rinse  15 mL Mouth Rinse BID  . methylPREDNISolone (SOLU-MEDROL) injection  40 mg Intravenous Q12H  . pantoprazole  40 mg Oral QAC breakfast  . potassium chloride  40 mEq Oral BID  . sodium chloride  2 g Oral TID WC   Continuous Infusions:  PRN Meds:.acetaminophen, albuterol, methocarbamol, ondansetron (ZOFRAN) IV, traMADol Medications Prior to Admission:  Prior to Admission medications   Medication Sig Start Date End Date Taking? Authorizing Provider  albuterol (PROVENTIL) (2.5 MG/3ML) 0.083% nebulizer solution Take 3 mLs (2.5 mg total) by nebulization every 6 (six) hours as needed for wheezing or shortness of breath. 12/04/15  Yes Tanda Rockers, MD  aspirin 81 MG tablet Take 81 mg by mouth daily. Reported on 05/28/2015   Yes Historical Provider, MD  chlorthalidone (HYGROTON) 25 MG tablet TAKE ONE-HALF TABLET BY MOUTH ONCE DAILY 08/06/15  Yes Binnie Rail, MD  cimetidine (TAGAMET) 200 MG tablet Take 200 mg by mouth daily.   Yes Historical Provider, MD  docusate sodium (COLACE) 100 MG capsule Take 100 mg by mouth daily as needed.    Yes Historical Provider, MD  FIBER PO Take 1 tablet by mouth daily as needed.    Yes Historical Provider, MD  Fluticasone-Salmeterol (ADVAIR DISKUS) 250-50 MCG/DOSE AEPB One puff twice daily 12/09/15  Yes Tanda Rockers,  MD  hydroxypropyl methylcellulose (ISOPTO TEARS) 2.5 % ophthalmic solution Place 2 drops into both eyes 4 (four) times daily as needed (for dry eyes).   Yes Historical Provider, MD  levothyroxine (SYNTHROID, LEVOTHROID) 50 MCG tablet 1/2 tablet daily 11/21/15  Yes Tanda Rockers, MD  magic mouthwash SOLN Take 5 mLs by mouth.   Yes Historical Provider, MD  metoprolol succinate (TOPROL-XL) 25 MG 24 hr tablet 1/2 tab by mouth daily 07/11/15  Yes Binnie Rail, MD  pantoprazole (PROTONIX) 40 MG tablet Take 1 tablet (40 mg total) by mouth daily. Take 30-60 min before first meal of the day 09/17/15  Yes Tanda Rockers, MD  PARoxetine (PAXIL) 20 MG tablet TAKE ONE TABLET BY MOUTH ONCE DAILY 12/09/15  Yes Binnie Rail, MD  umeclidinium bromide (INCRUSE ELLIPTA) 62.5 MCG/INH AEPB Inhale 1 puff into the lungs daily. 12/09/15  Yes Tanda Rockers, MD  VENTOLIN HFA 108 (90 Base) MCG/ACT inhaler INHALE TWO PUFFS BY MOUTH EVERY 6 HOURS AS NEEDED FOR WHEEZING FOR SHORTNESS OF BREATH 11/27/15  Yes Tanda Rockers, MD  famotidine (PEPCID) 20 MG tablet One at bedtime Patient not taking: Reported on 12/16/2015 12/09/15   Tanda Rockers, MD  methocarbamol (ROBAXIN) 500 MG tablet Take 1 tablet (500 mg total) by mouth 3 (three) times daily as needed for muscle spasms. 12/16/15   Margarita Mail, PA-C  nystatin (MYCOSTATIN) 100000 UNIT/ML suspension Take 10 mLs (1,000,000 Units total) by mouth 4 (four) times daily. Patient not taking: Reported on 12/16/2015 11/25/15   Tanda Rockers, MD   Allergies  Allergen Reactions  . Diclofenac     Hives  . Acetaminophen     Elevated LFTs  . Sulfa Antibiotics Nausea And Vomiting  . Ultram [Tramadol]     vomiting  . Fluvastatin Sodium     Lescol caused flu symptoms  . Meperidine Hcl     vomiting   Review of Systems  Constitutional: Positive for  activity change, appetite change and fatigue.  Respiratory: Positive for shortness of breath.   Musculoskeletal: Positive for back  pain.  Neurological: Positive for weakness.    Physical Exam  Constitutional: She is cooperative. She appears ill.  HENT:  Mouth/Throat: Mucous membranes are dry.  Cardiovascular: Tachycardia present.   Pulmonary/Chest: She has decreased breath sounds.  Neurological: She is alert.  Skin: Skin is warm and dry.    Vital Signs: BP 132/89   Pulse 95   Temp 97.8 F (36.6 C) (Axillary)   Resp 18   Ht 5\' 1"  (1.549 m)   Wt 66.2 kg (145 lb 15.1 oz)   SpO2 92%   BMI 27.58 kg/m  Pain Assessment: No/denies pain   Pain Score: Asleep   SpO2: SpO2: 92 % O2 Device:SpO2: 92 % O2 Flow Rate: .O2 Flow Rate (L/min): 2.5 L/min  IO: Intake/output summary:  Intake/Output Summary (Last 24 hours) at 12/22/15 1449 Last data filed at 12/21/15 2133  Gross per 24 hour  Intake              220 ml  Output              150 ml  Net               70 ml    LBM: Last BM Date: 12/18/15 Baseline Weight: Weight: 64.6 kg (142 lb 8 oz) Most recent weight: Weight: 66.2 kg (145 lb 15.1 oz)      Palliative Assessment/Data: 30 %   Discussed with Dr Alfredia Ferguson  Time In: 1400 Time Out: 1515 Time Total: 75 min Greater than 50%  of this time was spent counseling and coordinating care related to the above assessment and plan.  Signed by: Wadie Lessen, NP   Please contact Palliative Medicine Team phone at [phone] for questions and concerns.  For individual provider: See Shea Evans

## 2015-12-22 NOTE — Progress Notes (Signed)
Pt has tramadol listed as one of allergies, upon assessment pt stated it made her sick to her stomach last time but no hives, rash or other serious reaction to it. Daughter at bedside. Pt understand and acknowledge Nausea and Vomiting is side effect of Tramadol not allergic reaction. Pt agree to take tramadol at this time.

## 2015-12-22 NOTE — Consult Note (Signed)
   [facility] CM Inpatient Consult   12/22/2015  Olivia [name] [birth year] [id number]    Patient screened for potential [facility] Care Management services. Chart reviewed. Noted goals of care meeting scheduled for tomorrow. Spoke with inpatient RNCM. Asked to please make [place] Management consult if [facility] Management appropriate.    Marthenia Rolling, MSN-Ed, RN,BSN [facility] Liaison [phone]

## 2015-12-23 DIAGNOSIS — Z515 Encounter for palliative care: Secondary | ICD-10-CM

## 2015-12-23 DIAGNOSIS — M6281 Muscle weakness (generalized): Secondary | ICD-10-CM

## 2015-12-23 DIAGNOSIS — R0603 Acute respiratory distress: Secondary | ICD-10-CM

## 2015-12-23 DIAGNOSIS — Z66 Do not resuscitate: Secondary | ICD-10-CM

## 2015-12-23 LAB — CBC WITH DIFFERENTIAL/PLATELET
BASOS ABS: 0 10*3/uL (ref 0.0–0.1)
BASOS PCT: 0 %
EOS ABS: 0 10*3/uL (ref 0.0–0.7)
Eosinophils Relative: 0 %
HEMATOCRIT: 37.2 % (ref 36.0–46.0)
HEMOGLOBIN: 13.2 g/dL (ref 12.0–15.0)
Lymphocytes Relative: 6 %
Lymphs Abs: 0.5 10*3/uL — ABNORMAL LOW (ref 0.7–4.0)
MCH: 31.4 pg (ref 26.0–34.0)
MCHC: 35.5 g/dL (ref 30.0–36.0)
MCV: 88.4 fL (ref 78.0–100.0)
MONOS PCT: 4 %
Monocytes Absolute: 0.3 10*3/uL (ref 0.1–1.0)
NEUTROS ABS: 7.1 10*3/uL (ref 1.7–7.7)
NEUTROS PCT: 90 %
Platelets: 195 10*3/uL (ref 150–400)
RBC: 4.21 MIL/uL (ref 3.87–5.11)
RDW: 13.9 % (ref 11.5–15.5)
WBC: 7.9 10*3/uL (ref 4.0–10.5)

## 2015-12-23 LAB — URINE CULTURE

## 2015-12-23 LAB — RENAL FUNCTION PANEL
ALBUMIN: 3.2 g/dL — AB (ref 3.5–5.0)
ANION GAP: 13 (ref 5–15)
BUN: 23 mg/dL — AB (ref 6–20)
CALCIUM: 8.3 mg/dL — AB (ref 8.9–10.3)
CO2: 31 mmol/L (ref 22–32)
Chloride: 78 mmol/L — ABNORMAL LOW (ref 101–111)
Creatinine, Ser: 0.97 mg/dL (ref 0.44–1.00)
GFR calc Af Amer: >60 mL/min (ref >60)
GFR, EST NON AFRICAN AMERICAN: 53 mL/min — AB (ref >60)
GLUCOSE: 150 mg/dL — AB (ref 65–99)
PHOSPHORUS: 1.7 mg/dL — AB (ref 2.5–4.6)
Potassium: 3.1 mmol/L — ABNORMAL LOW (ref 3.5–5.1)
SODIUM: 122 mmol/L — AB (ref 135–145)

## 2015-12-23 LAB — MAGNESIUM: MAGNESIUM: 1.5 mg/dL — AB (ref 1.7–2.4)

## 2015-12-23 MED ORDER — MAGNESIUM OXIDE 400 (241.3 MG) MG PO TABS
400.0000 mg | ORAL_TABLET | Freq: Two times a day (BID) | ORAL | Status: DC
  Administered 2015-12-23: 400 mg via ORAL
  Filled 2015-12-23: qty 1

## 2015-12-23 MED ORDER — LORAZEPAM 2 MG/ML IJ SOLN: 1.0000 mg | INTRAMUSCULAR | Status: DC | PRN

## 2015-12-23 MED ORDER — MORPHINE SULFATE (PF) 2 MG/ML IV SOLN
1.0000 mg | INTRAVENOUS | Status: DC | PRN
  Filled 2015-12-23: qty 1

## 2015-12-23 MED ORDER — MORPHINE SULFATE (PF) 2 MG/ML IV SOLN
0.5000 mg | INTRAVENOUS | Status: AC
  Administered 2015-12-23: 0.5 mg via INTRAVENOUS
  Filled 2015-12-23: qty 1

## 2015-12-23 MED ORDER — POTASSIUM CHLORIDE 20 MEQ PO PACK
40.0000 meq | PACK | Freq: Three times a day (TID) | ORAL | Status: DC
  Administered 2015-12-23: 40 meq via ORAL
  Filled 2015-12-23 (×2): qty 2

## 2015-12-23 NOTE — Progress Notes (Addendum)
Patient ID: Olivia [name], female   DOB: 12-Jan-1932, 82 y.o.   MRN: [id number]  [facility] Progress Note   Assessment/ Plan:   1. 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 Sodium level unchanged overnight-- will stop salt tablets and continue lasix at the current dose-- Ure-Na supplement [place] be started as an out-patient: start at 15gm and titrate to keep Na~130. Appears asymptomatic with regards to low sodium- no indications for tolvaptan or 3% saline.  2. New mediastinal mass with associated adenopathy: plans noted leaning towards conservative care by pulmonary team with palliative care meeting later today. 3. Hyponatremia/hypochloremia: Likely from poor oral intake and ongoing loop diuretic use- replace via oral route. Replace magnesium  Subjective:   Reports to be feeling fair- earlier SOB alleviated with nebs.    Objective:   BP (!) 138/93 (BP Location: Right Arm)   Pulse 83   Temp 97.5 F (36.4 C) (Oral)   Resp 16   Ht 5\' 1"  (1.549 m)   Wt 64.3 kg (141 lb 12.1 oz)   SpO2 95%   BMI 26.78 kg/m   Intake/Output Summary (Last 24 hours) at 12/23/15 0901 Last data filed at 12/22/15 2335  Gross per 24 hour  Intake              340 ml  Output                0 ml  Net              340 ml   Weight change: -1.9 kg (-4 lb 3 oz)  Physical Exam: [id number] resting in recliner CVS: Pulse regular rhythm, S1 and S2 normal Resp: Coarse breath sounds bilaterally with occasional expiratory wheeze Abd: Soft, flat, nontender Ext: No lower extremity edema  Imaging: No results found.  Labs: BMET  Recent Labs Lab 12/17/15 1519  12/18/15 0348 12/19/15 0214  12/20/15 0539  12/21/15 0231 12/21/15 1018 12/21/15 1418 12/21/15 1747 12/22/15 0231 12/22/15 0741 12/23/15 0211  NA 123*  < > 122*  122* 119*  < > 117*  < > 117* 115* 118* 119* 120* 122* 122*  K 3.9  --  3.5   3.8 3.1*  < > 4.5  < > 3.5 3.5 3.7 3.3* 3.2* 3.1* 3.1*  CL 85*  --  85*  84* 78*  < > 78*  < > 75* 73* 75* 76* 75* 75* 78*  CO2 31  --  28  28 31   < > 32  < > 31 30 30 31  35* 35* 31  GLUCOSE 96  --  144*  143* 159*  < > 129*  < > 147* 139* 145* 161* 147* 129* 150*  BUN 12  --  12  11 18   < > 17  < > 20 21* 22* 22* 24* 23* 23*  CREATININE 0.80  --  0.80  0.76 0.87  < > 0.89  < > 0.87 0.98 0.93 0.94 1.03* 0.91 0.97  CALCIUM 8.8*  --  8.5*  8.6* 8.7*  < > 8.8*  < > 8.7* 9.0 8.6* 8.8* 8.7* 8.9 8.3*  PHOS 3.2  --  3.3  3.2 1.7*  --  1.8*  --  2.1*  --   --   --  2.2*  --  1.7*  < > = values in this interval not displayed. CBC  Recent Labs Lab 12/20/15 0539 12/21/15 0231 12/22/15 0231 12/23/15  0211  WBC 7.9 6.0 6.9 7.9  NEUTROABS 6.7 5.2 6.0 7.1  HGB 13.4 12.7 13.9 13.2  HCT 36.9 35.7* 38.5 37.2  MCV 87.0 86.7 87.3 88.4  PLT 192 183 214 195    Medications:    . aspirin  81 mg Oral Daily  . aspirin EC  325 mg Oral Once  . docusate sodium  100 mg Oral BID  . enoxaparin (LOVENOX) injection  40 mg Subcutaneous Q24H  . famotidine  10 mg Oral QHS  . furosemide  20 mg Intravenous BID  . ipratropium-albuterol  3 mL Nebulization TID  . levothyroxine  25 mcg Oral QAC breakfast  . mouth rinse  15 mL Mouth Rinse BID  . methylPREDNISolone (SOLU-MEDROL) injection  40 mg Intravenous Q12H  . pantoprazole  40 mg Oral QAC breakfast  . potassium chloride  40 mEq Oral BID  . sodium chloride  2 g Oral TID WC   Elmarie Shiley, MD 12/23/2015, 9:01 AM

## 2015-12-23 NOTE — Progress Notes (Signed)
PROGRESS NOTE    Olivia [name]  MRN:[id number] DOB: [birth year] DOA: 12/16/2015 PCP: Binnie Rail, MD  Brief Narrative:  Olivia [name] is a 82 y.o. female with a past medical history significant for COPD GOLD III, FEV1 47%, hypothyroidism, anxiety, and HTN who presents with significant worsening dyspnea over months and now chest pain. She was found to have a Mediastinal Mass on CT scan. She was transferred to SDU  because of her tenuous respiratory status. Nephrology was consulted for Hyponatremia. Palliative Care was consulted and a family meeting took place today and goals of care transitioned to [place] so all aggressive measures were discontinued.   Assessment & Plan:   Principal Problem:   Chronic respiratory failure with hypoxia (HCC) Active Problems:   Essential hypertension   COPD GOLD III with atypical upper airway features    Hypothyroidism   SOB (shortness of breath)   Chest pain   Mediastinal mass   Hypoxia   Acute on chronic diastolic congestive heart failure (HCC)   SIADH (syndrome of inappropriate ADH production) ([place])   Hyponatremia   Acute respiratory distress   Palliative care by specialist   DNR (do not resuscitate)  1. Dyspnea and Acute on Chronic Hypercarbic Hypoxic Respiratory likely 2/2 to COPD Exacerbation with Concomitant Pulmonary Edema from Acute Exacerbation of Diastolic CHF:  -Currently Saturating well with 2 Liters of O2 via [place] -Recent echocardiogram with normal EF of 55-60% but did have Diastolic Dysfunction (grade 1). *Repeat Echo showed  Left ventricle: The cavity size was normal. Wall thickness was increased in a pattern of moderate LVH. Systolic function was normal. The estimated ejection fraction was in the range of 60% to 65%. Wall motion was normal; there were no regional wall motion abnormalities. Doppler parameters are consistent with  abnormal left ventricular relaxation (grade 1 diastolic dysfunction). -Inhalers D/C'd by PCCM -Patient  placed on DuoNebs q6h and Albuterol Nebs q2hprn -IV Methylprednisolone 40 mg q6h decreased to IV 40 mg q12h per Pulmonology -Repeat Chest X-Ray showed The cardiac silhouette is borderline enlarged. Thoracic aortic atherosclerosis is again noted. Subcarinal lymphadenopathy/mass is better seen on recent CT. Less gaseous distension of hiatal hernia. Lung volumes remain diminished with unchanged bibasilar opacities. No sizable pleural effusion or pneumothorax is identified. -IV Lasix Discontinued as goal is now transitioned to [place] -Given IV Morphine 1 mg IV q1hprn for Moderate Pain/Dyspnea -Lorazepam 1 mg IV q4hprn for Anxiety  2. Hyponatremia suspected to be SIADH in the Setting of Lung Mass, improving -Appreciate Nephrology Consult and Reccomendations -Nephrology thinks its from SIADH from Mediastinal Mass  -Will Discontinue Sodium Checks as goal has transitioned to Comfort Measures now  3. Acute Exacerbation of Chronic Diastolic CHF -ECHO showed Left ventricle: The cavity size was normal. Wall thickness was increased in a pattern of moderate LVH. Systolic function was normal. The estimated ejection fraction was in the range of 60% to 65%. Wall motion was normal; there were no regional wall motion abnormalities. Doppler parameters are consistent with abnormal left ventricular relaxation (grade 1 diastolic dysfunction). -Goals of Care now Comfort  4. Hypokalemia -Patient's K+ was 3.1 today -Will Stop all Blood work as goals of Care have transitioned to Comfort  5. Chronic Back Pain with Left Lumbar Radiculopathy -C/w Robaxin 500 mg po q6hprn -Given IV Morphine 1 mg IV q1hprn for Moderate Pain/Dyspnea  6, Chest discomfort r/o ACS, improved -PE has been ruled out, as has dissection.   -Unclear that this is not an anginal  equivalent (shortness of breath and nausea).   -Serial enzymes overnight showed <0.03 x 6;  -Transthoracic Echo showed  Left ventricle: The cavity size was normal.  Wall thickness was increased in a pattern of moderate LVH. Systolic function was normal. The estimated ejection fraction was in the range of 60% to 65%. Wall motion was normal; there were no regional wall motion abnormalities. Doppler parameters are consistent with  abnormal left ventricular relaxation (grade 1 diastolic dysfunction). -Continue ASA 81 mg -Given IV Morphine 1 mg IV q1hprn for Moderate Pain/Dyspnea -Lorazepam 1 mg IV q4hprn for Anxiety  7. COPD with chronic respiratory failure and Underlying Emphysema:  -FEV1 47% most recently.  On O2 since this summer -Goals of Care now Comfort -Continue with IV Morphine 1 mg IV q1hprn for Moderate Pain/Dyspnea -Goals of Care now Comfort  8. HTN  -Comfort Care Orders  9. Hypothyroidism:  -TSH was 0.470 and Free T4 was 0.89 -Continue Levothyroxine 25 mcg po Before Breakfast  10. Mediastinal mass ? Malignancy:  -Biopsy currently is being planned as outpatient. -Will need PET/CT Imaging as an outpatient -Per Pulmonary patient is a poor candidate for EBUS -Goals of Care is patient is now Comfort  11. GERD -Continue Famotidine 10 mg po Daily and Protonix 40 mg po   12. Anxiety -Discontinue Paxil as it [place] contribute to Hyponatremia  DVT prophylaxis: Lovenox Code Status: Full Family Communication: Discussed Plan of Care with Son. Disposition Plan: Patient now Comfort Care; Will try for [facility]idential H[facility] at [facility]    Consultants:   PCCM/Pulmonary  Nephrology  Palliative Care Consultation  Procedures: None  Antimicrobials: None  Subjective: Patient seen and examined this AM she was complaining about being cold. Talked with the patient's son prior to [place] over the phone. After the meeting took place Goals of Care were transitioned and patient was made [place] and all aggressive measures were discontinued and all blood work stopped. Palliative Care to re-evaluate in AM as goals of care have  changed.  Objective: Vitals:   12/23/15 0747 12/23/15 1203 12/23/15 1322 12/23/15 1400  BP: (!) 138/93 100/85  (!) 131/94  Pulse:  93 100 99  Resp:  (!) 24 17 19   Temp: 97.5 F (36.4 C) 97.8 F (36.6 C)    TempSrc: Oral Oral    SpO2: 95% 93% 96% 94%  Weight:      Height:        Intake/Output Summary (Last 24 hours) at 12/23/15 1612 Last data filed at 12/23/15 1516  Gross per 24 hour  Intake              220 ml  Output                0 ml  Net              220 ml   Filed Weights   12/21/15 0500 12/22/15 0500 12/23/15 0410  Weight: 66.8 kg (147 lb 4.3 oz) 66.2 kg (145 lb 15.1 oz) 64.3 kg (141 lb 12.1 oz)    Examination: Physical Exam:  Constitutional: Thin elderly female in NAD Eyes: Lids and conjunctivae normal, sclerae anicteric  ENMT: External Ears, Nose appear normal. Grossly decreased hearing.  Neck: Appears normal, supple, no cervical masses, normal ROM, no appreciable thyromegaly, No JVD.  Respiratory: Diminished sounds but no wheezing, rales, or rhonchi. Normal respiratory effort and patient is slightly tachypenic. No accessory muscle use.  Cardiovascular: RRR, no murmurs / rubs / gallops.  S1 and S2 auscultated. No Appreciable LE edema.  Abdomen: Soft, non-tender, distended 2/2 body habitus. No masses palpated. No appreciable hepatosplenomegaly. Bowel sounds positive x4.  GU: Deferred. Musculoskeletal: No clubbing / cyanosis of digits/nails. No joint deformity upper and lower extremities.  Skin: No rashes, lesions, ulcers. No induration; Warm and dry.  Neurologic: Patient awake and more alert today. CN 2-12 grossly intact with no focal deficits. Sensation intact in all 4 Extremities. Romberg sign cerebellar reflexes not assessed.  Psychiatric: Normal judgment and insight. Alert and awake. Normal mood and appropriate affect.   Data Reviewed: I have personally reviewed following labs and imaging studies  CBC:  Recent Labs Lab 12/19/15 0214 12/20/15 0539  12/21/15 0231 12/22/15 0231 12/23/15 0211  WBC 6.0 7.9 6.0 6.9 7.9  NEUTROABS 5.2 6.7 5.2 6.0 7.1  HGB 13.0 13.4 12.7 13.9 13.2  HCT 35.7* 36.9 35.7* 38.5 37.2  MCV 87.1 87.0 86.7 87.3 88.4  PLT 162 192 183 214 0000000   Basic Metabolic Panel:  Recent Labs Lab 12/19/15 0214  12/20/15 0539  12/21/15 0231  12/21/15 1418 12/21/15 1747 12/22/15 0231 12/22/15 0741 12/23/15 0211  NA 119*  < > 117*  < > 117*  < > 118* 119* 120* 122* 122*  K 3.1*  < > 4.5  < > 3.5  < > 3.7 3.3* 3.2* 3.1* 3.1*  CL 78*  < > 78*  < > 75*  < > 75* 76* 75* 75* 78*  CO2 31  < > 32  < > 31  < > 30 31 35* 35* 31  GLUCOSE 159*  < > 129*  < > 147*  < > 145* 161* 147* 129* 150*  BUN 18  < > 17  < > 20  < > 22* 22* 24* 23* 23*  CREATININE 0.87  < > 0.89  < > 0.87  < > 0.93 0.94 1.03* 0.91 0.97  CALCIUM 8.7*  < > 8.8*  < > 8.7*  < > 8.6* 8.8* 8.7* 8.9 8.3*  MG 1.7  --  1.8  --  1.7  --   --   --  1.8  --  1.5*  PHOS 1.7*  --  1.8*  --  2.1*  --   --   --  2.2*  --  1.7*  < > = values in this interval not displayed. GFR: Estimated Creatinine Clearance: 38.4 mL/min (by C-G formula based on SCr of 0.97 mg/dL). Liver Function Tests:  Recent Labs Lab 12/17/15 1519 12/18/15 0348 12/19/15 0214 12/20/15 0539 12/21/15 0231 12/22/15 0231 12/23/15 0211  AST 67* 66*  --   --   --   --   --   ALT 34 35  --   --   --   --   --   ALKPHOS 70 74  --   --   --   --   --   BILITOT 0.8 0.8  --   --   --   --   --   PROT 6.4* 6.4*  --   --   --   --   --   ALBUMIN 3.1* 3.1*  3.0* 3.4* 3.2* 3.2* 3.3* 3.2*   No results for input(s): LIPASE, AMYLASE in the last 168 hours. No results for input(s): AMMONIA in the last 168 hours. Coagulation Profile: No results for input(s): INR, PROTIME in the last 168 hours. Cardiac Enzymes:  Recent Labs Lab 12/17/15 0630 12/17/15 [id number] 12/17/15 1519 12/17/15 2242  12/18/15 0348  TROPONINI <0.03 <0.03 <0.03 <0.03 <0.03   BNP (last 3 results)  Recent Labs  09/17/15 1551  12/09/15 1735  PROBNP 119.0* 31.0   HbA1C: No results for input(s): HGBA1C in the last 72 hours. CBG: No results for input(s): GLUCAP in the last 168 hours. Lipid Profile: No results for input(s): CHOL, HDL, LDLCALC, TRIG, CHOLHDL, LDLDIRECT in the last 72 hours. Thyroid Function Tests: No results for input(s): TSH, T4TOTAL, FREET4, T3FREE, THYROIDAB in the last 72 hours. Anemia Panel: No results for input(s): VITAMINB12, FOLATE, FERRITIN, TIBC, IRON, RETICCTPCT in the last 72 hours. Sepsis Labs:  Recent Labs Lab 12/17/15 0015 12/18/15 0348 12/20/15 0539  PROCALCITON <0.10 0.11 <0.10    Recent Results (from the past 240 hour(s))  MRSA PCR Screening     Status: None   Collection Time: 12/17/15 11:10 PM  Result Value Ref Range Status   MRSA by PCR NEGATIVE NEGATIVE Final    Comment:        The GeneXpert MRSA Assay (FDA approved for NASAL specimens only), is one component of a comprehensive MRSA colonization surveillance program. It is not intended to diagnose MRSA infection nor to guide or monitor treatment for MRSA infections.   Culture, Urine     Status: Abnormal   Collection Time: 12/21/15 12:29 PM  Result Value Ref Range Status   Specimen Description URINE, CLEAN CATCH  Final   Special Requests NONE  Final   Culture >=100,000 COLONIES/mL ESCHERICHIA COLI (A)  Final   Report Status 12/23/2015 FINAL  Final   Organism ID, Bacteria ESCHERICHIA COLI (A)  Final      Susceptibility   Escherichia coli - MIC*    AMPICILLIN 4 SENSITIVE Sensitive     CEFAZOLIN <=4 SENSITIVE Sensitive     CEFTRIAXONE <=1 SENSITIVE Sensitive     CIPROFLOXACIN <=0.25 SENSITIVE Sensitive     GENTAMICIN <=1 SENSITIVE Sensitive     IMIPENEM <=0.25 SENSITIVE Sensitive     NITROFURANTOIN <=16 SENSITIVE Sensitive     TRIMETH/SULFA <=20 SENSITIVE Sensitive     AMPICILLIN/SULBACTAM <=2 SENSITIVE Sensitive     PIP/TAZO <=4 SENSITIVE Sensitive     Extended ESBL NEGATIVE Sensitive     *  >=100,000 COLONIES/mL ESCHERICHIA COLI    Radiology Studies: No results found. Scheduled Meds: . aspirin  81 mg Oral Daily  . docusate sodium  100 mg Oral BID  . famotidine  10 mg Oral QHS  . ipratropium-albuterol  3 mL Nebulization TID  . mouth rinse  15 mL Mouth Rinse BID  . methylPREDNISolone (SOLU-MEDROL) injection  40 mg Intravenous Q12H  . pantoprazole  40 mg Oral QAC breakfast   Continuous Infusions:    LOS: 5 days   Kerney Elbe, DO [facility] Pager [phone]  If 7PM-7AM, please contact night-coverage [email or web address] Password [email or web address] 12/23/2015, 4:12 PM

## 2015-12-23 NOTE — Evaluation (Signed)
Physical Therapy Evaluation Patient Details Name: Olivia [name] MRN: [id number] DOB: [birth year] Today's Date: 12/23/2015   History of Present Illness  Olivia [name] is a 82 y.o. female with a past medical history significant for COPD GOLD III, FEV1 47%, hypothyroidism, anxiety, and HTN who presents with significant worsening dyspnea over months and now chest pain. She was found to have a Mediastinal Mass on CT scan. She was transferred to SDU  because of her tenuous respiratory status. Nephrology was consulted for Hyponatremia.   Clinical Impression  Patient presents with decreased independence and safety with mobility and will benefit from skilled PT in the acute setting to allow d/c home with family support if capable 24 hour assist is available.  [name] need STSNF if family unable to assist. Will follow along and update d/c recommendations as needed.    Follow Up Recommendations Home health PT;Supervision/Assistance - 24 hour (depending on plans after pall care c/s)    Equipment Recommendations  Wheelchair (measurements PT);Wheelchair cushion (measurements PT)    Recommendations for Other Services       Precautions / Restrictions Precautions Precautions: Fall Restrictions Weight Bearing Restrictions: No      Mobility  Bed Mobility               General bed mobility comments: pt up in chair  Transfers Overall transfer level: Needs assistance Equipment used: Rolling walker (2 wheeled) Transfers: Stand Pivot Transfers;Sit to/from Stand Sit to Stand: Mod assist Stand pivot transfers: Mod assist       General transfer comment: posterior bias initially in standing with posterior LOB and returned to sitting in chair, needed support for balance, assist for up off chair to [facility] with some urinary incontinence  Ambulation/Gait Ambulation/Gait assistance: Min assist Ambulation Distance (Feet): 30 Feet Assistive device: Rolling walker (2 wheeled) Gait Pattern/deviations: Trunk  flexed;Shuffle;Decreased stride length;Step-to pattern;Decreased dorsiflexion - left;Leaning posteriorly        Stairs            Wheelchair Mobility    Modified Rankin (Stroke Patients Only)       Balance Overall balance assessment: Needs assistance   Sitting balance-Leahy Scale: Good   Postural control: Posterior lean Standing balance support: Bilateral upper extremity supported Standing balance-Leahy Scale: Poor Standing balance comment: assist needed with walker for support for balance                             Pertinent Vitals/Pain Pain Assessment: No/denies pain    Home Living Family/patient expects to be discharged to:: Private residence Living Arrangements: Children Available Help at Discharge: Family;Available 24 hours/day Type of Home: House Home Access: Stairs to enter Entrance Stairs-Rails: None Entrance Stairs-Number of Steps: 2 Home Layout: Two level;Able to live on main level with bedroom/bathroom Home Equipment: Gilford Rile - 2 wheels;Bedside commode      Prior Function Level of Independence: Independent         Comments: hasn't been using walker at home     Hand Dominance        Extremity/Trunk Assessment               Lower Extremity Assessment: Generalized weakness      Cervical / Trunk Assessment: Kyphotic;Other exceptions  Communication   Communication: No difficulties  Cognition Arousal/Alertness: Awake/alert Behavior During Therapy: WFL for tasks assessed/performed Overall Cognitive Status: No family/caregiver present to determine baseline cognitive functioning Area of Impairment: Orientation;Memory;Safety/judgement Orientation Level: Disoriented to;Time (  took awhile to recall what she was here for)   Memory: Decreased short-term memory   Safety/Judgement: Decreased awareness of deficits;Decreased awareness of safety          General Comments      Exercises     Assessment/Plan    PT  Assessment Patient needs continued PT services  PT Problem List Decreased strength;Decreased activity tolerance;Decreased balance;Decreased mobility;Decreased safety awareness;Cardiopulmonary status limiting activity;Decreased knowledge of use of DME          PT Treatment Interventions DME instruction;Gait training;Therapeutic activities;Patient/family education;Therapeutic exercise;Stair training;Functional mobility training    PT Goals (Current goals can be found in the Care Plan section)  Acute Rehab PT Goals Patient Stated Goal: To go home PT Goal Formulation: With patient Time For Goal Achievement: 12/30/15 Potential to Achieve Goals: Good    Frequency Min 3X/week   Barriers to discharge   unsure how much assist family can provide    Co-evaluation               End of Session Equipment Utilized During Treatment: Gait belt;Oxygen Activity Tolerance: Patient limited by fatigue Patient left: in chair;with call bell/phone within reach;with chair alarm set           Time: [id number] PT Time Calculation (min) (ACUTE ONLY): 35 min   Charges:   PT Evaluation $PT Eval Moderate Complexity: 1 Procedure PT Treatments $Gait Training: 8-22 mins   PT G CodesReginia [name] 2016-01-11, 11:24 AM  Olivia [name], [place] 01-11-16

## 2015-12-24 ENCOUNTER — Encounter: Payer: Medicare Other | Admitting: Adult Health

## 2015-12-24 DIAGNOSIS — E038 Other specified hypothyroidism: Secondary | ICD-10-CM

## 2015-12-24 DIAGNOSIS — J449 Chronic obstructive pulmonary disease, unspecified: Secondary | ICD-10-CM

## 2015-12-24 DIAGNOSIS — E871 Hypo-osmolality and hyponatremia: Secondary | ICD-10-CM

## 2015-12-24 MED ORDER — MORPHINE SULFATE (PF) 2 MG/ML IV SOLN
1.0000 mg | Freq: Four times a day (QID) | INTRAVENOUS | Status: DC
  Administered 2015-12-24: 1 mg via INTRAVENOUS
  Filled 2015-12-24: qty 1

## 2015-12-24 MED ORDER — LEVOTHYROXINE SODIUM 25 MCG PO TABS
25.0000 ug | ORAL_TABLET | Freq: Every day | ORAL | Status: DC
  Administered 2015-12-24: 25 ug via ORAL
  Filled 2015-12-24: qty 1

## 2015-12-24 MED ORDER — MORPHINE SULFATE (PF) 2 MG/ML IV SOLN
0.5000 mg | Freq: Four times a day (QID) | INTRAVENOUS | Status: DC
  Administered 2015-12-24: 0.5 mg via INTRAVENOUS

## 2015-12-24 MED ORDER — METOPROLOL SUCCINATE ER 25 MG PO TB24
12.5000 mg | ORAL_TABLET | Freq: Every day | ORAL | Status: DC
  Administered 2015-12-24: 12.5 mg via ORAL
  Filled 2015-12-24: qty 1

## 2015-12-24 NOTE — Progress Notes (Signed)
Daily Progress Note   Patient Name: Olivia [name]       Date: 12/24/2015 DOB: 1931-07-12  Age: 82 y.o. MRN#: [id number] Attending Physician: Tawni Millers, * Primary Care Physician: Binnie Rail, MD Admit Date: 12/16/2015  Reason for Consultation/Follow-up: Establishing goals of care, Non pain symptom management, Pain control and Psychosocial/spiritual support  Subjective:  - continued conversation with son Linna Hoff and patient, all are comfortable with decision to focus on comfort and hope is to transition to hospice facility  -discussed likely trajectory within the context of comfort  -discussed symptom management strategies  Length of Stay: 6  Current Medications: Scheduled Meds:  . aspirin  81 mg Oral Daily  . docusate sodium  100 mg Oral BID  . famotidine  10 mg Oral QHS  . ipratropium-albuterol  3 mL Nebulization TID  . mouth rinse  15 mL Mouth Rinse BID  . pantoprazole  40 mg Oral QAC breakfast    Continuous Infusions:    PRN Meds: acetaminophen, albuterol, LORazepam, methocarbamol, morphine injection, ondansetron (ZOFRAN) IV, traMADol  Physical Exam  Constitutional: She appears ill. Nasal cannula in place.  Cardiovascular: Normal rate, regular rhythm and normal heart sounds.   Pulmonary/Chest: Tachypnea noted. She has decreased breath sounds.  Skin: Skin is warm and dry.            Vital Signs: BP (!) 141/84 (BP Location: Left Arm)   Pulse 73   Temp 97.7 F (36.5 C) (Oral)   Resp (!) 22   Ht 5\' 1"  (1.549 m)   Wt 64.3 kg (141 lb 12.1 oz)   SpO2 96%   BMI 26.78 kg/m  SpO2: SpO2: 96 % O2 Device: O2 Device: Nasal Cannula O2 Flow Rate: O2 Flow Rate (L/min): 2 L/min  Intake/output summary:  Intake/Output Summary (Last 24 hours) at 12/24/15 0842 Last  data filed at 12/24/15 0349  Gross per 24 hour  Intake              580 ml  Output              500 ml  Net               80 ml   LBM: Last BM Date: 12/21/15 Baseline Weight: Weight: 64.6 kg (142 lb 8 oz) Most recent weight:  Weight: 64.3 kg (141 lb 12.1 oz)       Palliative Assessment/Data: 30 % at best    Flowsheet Rows   Flowsheet Row Most Recent Value  Intake Tab  Referral Department  Hospitalist  Unit at Time of Referral  Intermediate Care Unit  Palliative Care Primary Diagnosis  Pulmonary  Date Notified  12/21/15  Palliative Care Type  New Palliative care  Reason for referral  Clarify Goals of Care  Date of Admission  12/16/15  Date first seen by Palliative Care  12/23/15  # of days Palliative referral response time  2 Day(s)  # of days IP prior to Palliative referral  5  Clinical Assessment  Psychosocial & Spiritual Assessment  Palliative Care Outcomes      Patient Active Problem List   Diagnosis Date Noted  . Acute respiratory distress   . Palliative care by specialist   . DNR (do not resuscitate)   . Muscle weakness (generalized)   . SIADH (syndrome of inappropriate ADH production) ([place]) 12/21/2015  . Hyponatremia 12/21/2015  . Acute on chronic diastolic congestive heart failure ([place])   . Hypoxia   . Chest pain 12/16/2015  . Mediastinal mass 12/16/2015  . Chronic respiratory failure with hypoxia ([place]) 12/09/2015  . COPD exacerbation ([place]) 09/10/2015  . Diastolic dysfunction [id number]  . Dyspnea 07/11/2015  . Upper airway cough syndrome 05/29/2015  . Hypokalemia 11/23/2013  . Spinal stenosis of lumbar region 11/21/2013  . Elevated serum creatinine 09/15/2013  . Abnormal ECG 03/10/2012  . Chest pressure 03/10/2012  . Hypothyroidism 01/11/2012  . Other abnormal glucose 08/24/2011  . Mood swings ([place]) 03/19/2009  . ANXIETY STATE, UNSPECIFIED 01/02/2009  . Essential hypertension 01/02/2009  . ELECTROCARDIOGRAM, ABNORMAL 01/02/2009  . TINNITUS, LEFT  04/09/2008  . NEUROPATHY 12/07/2007  . Disturbance in sleep behavior 12/07/2007  . MEMORY LOSS 12/07/2007  . CARCINOMA IN SITU OF SKIN SITE UNSPECIFIED 10/17/2007  . PORTAL HYPERTENSION 10/17/2007  . EXTERNAL HEMORRHOIDS 10/16/2007  . COLONIC POLYPS, HYPERPLASTIC, HX OF 10/16/2007  . MALIG NEOPLASM CONNECTIVE&OTH SOFT TISSUE PELVIS 09/19/2007  . COPD GOLD III with atypical upper airway features  03/07/2007  . Hyperlipidemia 10/25/2006  . MYOPATHY NOS 10/25/2006  . LUMBAR RADICULOPATHY, LEFT 10/25/2006    Palliative Care Assessment & Plan   Patient Profile: 82 y.o. female   admitted on 12/16/2015 with a past medical history significant for COPD GOLD III, FEV1 47%, hypothyroidism, anxiety, and HTNwho presents with worsening dyspnea over months and now chest pain.   Per family she has had a rapid physical and functional decline over the past 4 weeks  She had to limit her daily activities (couldn't walk around much), was using her albuterol 2-3 times per day.  -Some adjustments were made to her inhalers, tweaks to her GERD medicines --> no improvement.  -9/29 did a 6-day prednisone taper without improvement, only worsening and needing albuterol every few hours  -10/17 O2 sat while ambulating down to 84%, started on home O2  On 10/24 she woke up complaining of her daughter of "chest tightness" that was constant, associated with nausea, and further worsening of her shortness of breath. Her family arranged for her to be seen in [facility] clinic, and she was referred from there to the ER.  ED course: -Afebrile, heart rates 46-50s, respirations normal, pulse ox normal on 2L [place], BP fluctuated [id number] systolic -Na [id number], K 3.6, Cr 0.9(baseline 1.4), WBC 3.6K, Hgb 12.3,troponin negative, BNP normal  -CXR unremarkable, CTA chest showed a  new 2cm mediastinal mass and suspicious lymphadenopathy, as well as emphysema, some mild atelectasis, but no effusions, pneumonia, or PE  She has  continued with no improvement , continued hyponatremia and intermittent confusion.    Patient and family have made decision for focus of comfort and allow nature to take its course  Assessment: -patient is weaker today, with very little intake.  Increased dyspnea, scheduled Morphine   Recommendations/Plan:  Focus of care is comfort and dignity  Symptom management- see orders  Referral for hospice facility  Goals of Care and Additional Recommendations:  Limitations on Scope of Treatment: Full Comfort Care  Code Status:    Code Status Orders        Start     Ordered   12/23/15 1507  Do not attempt resuscitation (DNR)  Continuous    Question Answer Comment  In the event of cardiac or respiratory ARREST Do not call a "code blue"   In the event of cardiac or respiratory ARREST Do not perform Intubation, CPR, defibrillation or ACLS   In the event of cardiac or respiratory ARREST Use medication by any route, position, wound care, and other measures to relive pain and suffering. [place] use oxygen, suction and manual treatment of airway obstruction as needed for comfort.      12/23/15 1506    Code Status History    Date Active Date Inactive Code Status Order ID Comments User Context   12/17/2015 12:08 AM 12/23/2015  3:06 PM Full Code [id number]  Edwin Dada, MD Inpatient       Prognosis:   < 2 weeks- patient has decline in the past 24 hrs.  She is weaker and dyspneic.  She is not eating but does continue to take liquids.  She has a new mediastinal mass that will not be worked up.  Her sodium has remained in the low 120s during this hospital stay. No further diagnostics or treatmetns  Discharge Planning:  Hospice facility  Care plan was discussed with bedside nurse  Thank you for allowing the Palliative Medicine Team to assist in the care of this patient.   Time In:  0800 Time Out: 0835 Total Time 35 min  Prolonged Time Billed  no       Greater than 50%  of this  time was spent counseling and coordinating care related to the above assessment and plan.  Wadie Lessen, NP  Please contact Palliative Medicine Team phone at [phone] for questions and concerns.

## 2015-12-24 NOTE — Progress Notes (Signed)
[facility] Liaison Visit Received request from [place] for family interest in [facility] with request for transfer today.  Chart reviewed.  Met with patient and family to confirm interest and explain services.  Family agreeable to transfer today.  Registration paper work completed with POA son Quillian Quince Musco) and given [facility] Patient Care Notebook.   LCSW made aware and has called transport.  Dr. Orpah Melter to assume care per family request.  Please fax discharge summary to [phone].  RN notified Liaison that she has called report.   Thank you, Gar Ponto, [facility] Liaison RN [phone]

## 2015-12-24 NOTE — Care Management Note (Signed)
Case Management Note  Patient Details  Name: Olivia [name] MRN: [id number] Date of Birth: 01/20/1932  Subjective/Objective:  82 y.o. F admitted 12/16/2015. Family meeting 12/23/2015 GOC with Palliative Care resulting in [place] and desire for [facility]. Spoke with Arrien, MD who is suggesting [name] be ready for discharge. Percell Locus, CSW aware of above. All aggressive treatments have been stopped.                   Action/Plan:CM will sign off for now but will be available should additional discharge needs arise or disposition change.    Expected Discharge Date:                  Expected Discharge Plan:  [place]  In-House Referral:  Clinical Social Work  Discharge planning Services  CM Consult  Post Acute Care Choice:  NA Choice offered to:     DME Arranged:    DME Agency:     HH Arranged:    [place] Agency:     Status of Service:  In process, will continue to follow  If discussed at Long Length of Stay Meetings, dates discussed:    Additional Comments:  Delrae Sawyers, RN 12/24/2015, 11:33 AM

## 2015-12-24 NOTE — Progress Notes (Signed)
Patient ID: Olivia [name], female   DOB: 1931/10/05, 82 y.o.   MRN: [id number] Chart reviewed and noted that patient has transitioned over to comfort care only with the aim of transfer to hospice. Labs and nonessential medications have been discontinued.  [facility] service will sign off at this point-please call back for questions or concerns.  Elmarie Shiley MD [facility]. Office # [phone] Pager # [phone] 10:34 AM

## 2015-12-24 NOTE — Progress Notes (Signed)
Patient to be discharged to [facility].  Report called to [employer].  Patient will be transported via ambulance.

## 2015-12-24 NOTE — Progress Notes (Signed)
Patient will DC to: [facility] Anticipated DC date: 12/24/15 Family notified: Son Transport by: Corey Harold   Per MD patient ready for DC to [facility]. RN, patient, patient's family, and facility notified of DC. Discharge Summary sent to facility. RN given number for report. DC packet on chart. Ambulance transport requested for patient.   CSW signing off.  Cedric Fishman, [place] Social Worker [phone]

## 2015-12-24 NOTE — Progress Notes (Signed)
CSW spoke to patient's son, Olivia [name], regarding discharge planning. He would like patient to discharge to [facility] as first choice.  CSW sent referral to [facility] for review.  Percell Locus [place] LCSWA [phone]

## 2015-12-24 NOTE — Progress Notes (Signed)
MD notified, pt son thinks that the patient has pink eye in Left eye do to discharge that he cleaned from eye. Upon assessment small amount of discharge in inner corner of left eye and it is more reddened scelera than right side. MD Arrien states that he will order some eyedrops for patient to discharge with. Will continue to monitor closely.

## 2015-12-24 NOTE — Discharge Summary (Signed)
Physician Discharge Summary  Olivia [name] [id number] DOB: 1931-05-29 DOA: 12/16/2015  PCP: Binnie Rail, MD  Admit date: 12/16/2015 Discharge date: 12/24/2015  Admitted From:  Home  Disposition:  Hospice   Recommendations for Outpatient Follow-up:  1. Follow up with PCP in 1  Home Health: Home  Equipment/Devices: on home 02  Discharge Condition: Stable  CODE STATUS: DNR Diet recommendation: Heart healthy   Brief/Interim Summary: This is a 82 year old female who presented to hospital with a chief complaint of dyspnea and chest tightness. Over last few months prior to admission she had been developing worsening dyspnea on exertion that limited her functional physical capacity, her medications were changed as an outpatient including short course of steroids. The day of admission she developed chest tightness which was constant associated with nausea and further worsening dyspnea. She was seen in the pulmonary clinic where she was referred to the emergency department. On initial physical examination her blood pressure was 99/44, heart rate 80, respiratory rate 19, oxygen saturation 88%. Her neck was supple, heart S1-S2 present rhythmic, no JVD, no lower extreimity edema, her lungs had poor inspiratory effort with poor air movement, no wheezing or rales, her respirations were noted to be shallow, her abdomen was soft and nontender. Her sodium 129, potassium 3.6, BUN 16, creatinine 0.89, glucose 92, pH 7.31, (venous), white count 3.6, hemoglobin 12.3, hematocrit 36.1, platelets 145. Her CT chest showed mediastinal adenopathy and mild emphysematous disease in the upper lobes bilaterally. 3.52.8 cm subcarinal mass. 18 mm precarinal lymph node. Her chest x-ray showed bibasilar atelectasis and increased lung markings bilaterally, as well as hyperinflation. EKG showed sinus rhythm with left axis deviation and a right bundle branch block.  The patient was admitted to the ospital with working diagnosis  of dyspnea and chest discomfort, complicated by hyponatremia and mediastinal mass.  1. COPD exacerbation. Patient was placed on systemic steroids, bronchodilator therapy, supplemental oxygen. By the time of discharge her symptoms have improved, she had finished her course of steroids in the hospital. She will continue home oxygen. She will continue inhaled steroids and inhaled long-acting beta 2 agonist with Advair.   2. Diastolic heart failure. It was suspected that part of her dyspnea was related to pulmonary edema related to diastolic heart failure, chronic probably related to cor pulmonale related to advanced COPD. Patient was diuresed with IV furosemide with improvement of her dyspnea. Echocardiogram showed left ventricle ejection fraction between 60 and 65% with increase wall of thickness consistent with moderate LVH. Grade 1 diastolic dysfunction.   3. Hypokalemia/hyponatremia. Patient's electrolytes were corrected, patient was seen by nephrology, her hyponatremia was presumed to be related to possible SIADH related to chest mass plus use of chlorthalidone. Patient was diuresed and also was placed on fluid restriction. Her discharge sodium is 122 and potassium 3.1. His lowest sodium reach 115 on October 29. Will continue to hold chlorthalidone for now.   4. Mediastinal mass. Patient was seen by the pulmonary service, patient was deemed not to be a good candidate for ultrasound- bronchoscopy. The plan is to follow-up as an outpatient. Patient has been evaluated with palliative care team, patient has decided to go home with hospice. Code status is DNR.  5. Hypertension. Patient will be resumed on metoprolol, chlorthalidone be on hold for now. Her blood pressure has remained stable, systolic [id number] to [id number].  6. Hypothyroidism. She will be continued on levothyroxine.  Patient is being discharge to [facility].   Discharge Diagnoses:  Principal Problem:  Chronic respiratory failure with hypoxia  (HCC) Active Problems:   Essential hypertension   COPD GOLD III with atypical upper airway features    Hypothyroidism   Dyspnea   Chest pain   Mediastinal mass   Hypoxia   Acute on chronic diastolic congestive heart failure (HCC)   SIADH (syndrome of inappropriate ADH production) (HCC)   Hyponatremia   Acute respiratory distress   Palliative care by specialist   DNR (do not resuscitate)   Muscle weakness (generalized)    Discharge Instructions     Medication List    STOP taking these medications   chlorthalidone 25 MG tablet Commonly known as:  HYGROTON   nystatin 100000 UNIT/ML suspension Commonly known as:  MYCOSTATIN     TAKE these medications   aspirin 81 MG tablet Take 81 mg by mouth daily. Reported on 05/28/2015   cimetidine 200 MG tablet Commonly known as:  TAGAMET Take 200 mg by mouth daily.   docusate sodium 100 MG capsule Commonly known as:  COLACE Take 100 mg by mouth daily as needed.   famotidine 20 MG tablet Commonly known as:  PEPCID One at bedtime   FIBER PO Take 1 tablet by mouth daily as needed.   Fluticasone-Salmeterol 250-50 MCG/DOSE Aepb Commonly known as:  ADVAIR DISKUS One puff twice daily   hydroxypropyl methylcellulose / hypromellose 2.5 % ophthalmic solution Commonly known as:  ISOPTO TEARS / GONIOVISC Place 2 drops into both eyes 4 (four) times daily as needed (for dry eyes).   levothyroxine 50 MCG tablet Commonly known as:  SYNTHROID, LEVOTHROID 1/2 tablet daily   magic mouthwash Soln Take 5 mLs by mouth.   methocarbamol 500 MG tablet Commonly known as:  ROBAXIN Take 1 tablet (500 mg total) by mouth 3 (three) times daily as needed for muscle spasms. What changed:  how much to take  when to take this   metoprolol succinate 25 MG 24 hr tablet Commonly known as:  TOPROL-XL 1/2 tab by mouth daily   pantoprazole 40 MG tablet Commonly known as:  PROTONIX Take 1 tablet (40 mg total) by mouth daily. Take 30-60 min  before first meal of the day   PARoxetine 20 MG tablet Commonly known as:  PAXIL TAKE ONE TABLET BY MOUTH ONCE DAILY   umeclidinium bromide 62.5 MCG/INH Aepb Commonly known as:  INCRUSE ELLIPTA Inhale 1 puff into the lungs daily.   VENTOLIN HFA 108 (90 Base) MCG/ACT inhaler Generic drug:  albuterol INHALE TWO PUFFS BY MOUTH EVERY 6 HOURS AS NEEDED FOR WHEEZING FOR SHORTNESS OF BREATH   albuterol (2.5 MG/3ML) 0.083% nebulizer solution Commonly known as:  PROVENTIL Take 3 mLs (2.5 mg total) by nebulization every 6 (six) hours as needed for wheezing or shortness of breath.      Follow-up Information    [facility].   Specialty:  Pulmonology Why:  They will call you with an appointment Contact information: [address] [place] [phone]         Allergies  Allergen Reactions  . Diclofenac     Hives  . Acetaminophen     Elevated LFTs  . Sulfa Antibiotics Nausea And Vomiting  . Ultram [Tramadol]     vomiting  . Fluvastatin Sodium     Lescol caused flu symptoms  . Meperidine Hcl     vomiting    Consultations:  Pulmonary  Palliative Care   Procedures/Studies: Dg Chest 2 View  Result Date: 12/10/2015 CLINICAL DATA:  Chronic cough, congestion, shortness of breath, former smoking history EXAM: CHEST  2 VIEW COMPARISON:  Chest x-ray of 11/21/2015 FINDINGS: Linear scarring or atelectasis at the lung bases is stable. No parenchymal infiltrate or pleural effusion is seen. The lungs remain hyperaerated suggesting an element of emphysema with increased AP diameter as well. No mediastinal or hilar adenopathy is seen. The heart is mildly enlarged and stable. No acute bony abnormality is noted with the bones appearing somewhat osteopenic. IMPRESSION: Stable bibasilar linear scarring or atelectasis. No definite active process. Hyperaeration suggesting emphysema. Electronically Signed   By: Ivar Drape M.D.   On: 12/10/2015 09:07   Ct  Angio Chest Pe W And/or Wo Contrast  Result Date: 12/16/2015 CLINICAL DATA:  Shortness of breath.  Chest pain. EXAM: CT ANGIOGRAPHY CHEST WITH CONTRAST TECHNIQUE: Multidetector CT imaging of the chest was performed using the standard protocol during bolus administration of intravenous contrast. Multiplanar CT image reconstructions and MIPs were obtained to evaluate the vascular anatomy. CONTRAST:  60 mL of Isovue 370 intravenously. COMPARISON:  Radiographs of December 16, 2015. CT scan of July 29, 2007. FINDINGS: Cardiovascular: Atherosclerosis of thoracic aorta is noted without aneurysm. There is no evidence of pulmonary embolus. Coronary artery calcifications are noted. Mediastinum/Nodes: 3.5 x 2.8 cm sub carinal mass is noted. 18 mm precarinal lymph node is noted. Lungs/Pleura: No pneumothorax or significant pleural effusion is noted. Mild bibasilar subsegmental atelectasis is noted. Mild emphysematous disease is noted in the upper lobes bilaterally. Upper Abdomen: Visualized portion of upper abdomen is unremarkable. Musculoskeletal: No significant osseous abnormality is noted. Review of the MIP images confirms the above findings. IMPRESSION: Aortic atherosclerosis. Coronary artery calcifications are noted suggesting coronary artery disease. No definite evidence of pulmonary embolus. Mediastinal adenopathy is noted concerning for possible metastatic disease or malignancy. Clinical correlation is recommended. Mild emphysematous disease is noted in the upper lobes bilaterally. Electronically Signed   By: Marijo Conception, M.D.   On: 12/16/2015 15:46   Dg Chest Port 1 View  Result Date: 12/20/2015 CLINICAL DATA:  Respiratory failure EXAM: PORTABLE CHEST 1 VIEW COMPARISON:  12/18/2015 FINDINGS: Interval improvement in bibasilar atelectasis. Negative for heart failure or effusion. No pneumothorax. IMPRESSION: Improvement in bibasilar atelectasis Electronically Signed   By: Franchot Gallo M.D.   On: 12/20/2015  09:35   Dg Chest Port 1 View  Result Date: 12/18/2015 CLINICAL DATA:  Shortness of breath. EXAM: PORTABLE CHEST 1 VIEW COMPARISON:  12/17/2015 FINDINGS: The cardiac silhouette is borderline enlarged. Thoracic aortic atherosclerosis is again noted. Subcarinal lymphadenopathy/mass is better seen on recent CT. Less gaseous distension of hiatal hernia. Lung volumes remain diminished with unchanged bibasilar opacities. No sizable pleural effusion or pneumothorax is identified. IMPRESSION: 1. Low lung volumes with unchanged bibasilar atelectasis. 2. Subcarinal lymphadenopathy/mass as demonstrated on recent CT. 3. Aortic atherosclerosis. Electronically Signed   By: Logan Bores M.D.   On: 12/18/2015 08:37   Dg Chest Port 1 View  Result Date: 12/17/2015 CLINICAL DATA:  Shortness of breath. EXAM: PORTABLE CHEST 1 VIEW COMPARISON:  CT chest and chest radiograph 12/16/2015. FINDINGS: Patient is rotated. Heart size is accentuated by AP semi upright technique and low lung volumes. Thoracic aorta is calcified. Subcarinal mass, as on yesterday's CT exam. Bibasilar subsegmental atelectasis. Lungs are otherwise clear. No pleural fluid. Moderate hiatal hernia. IMPRESSION: 1. Low lung volumes with bibasilar atelectasis. 2. Subcarinal mass, better seen on yesterday's CT chest. 3.  Aortic atherosclerosis (ICD10-170.0). Electronically Signed   By: Lorin Picket M.D.  On: 12/17/2015 08:56   Dg Chest Portable 1 View  Result Date: 12/16/2015 CLINICAL DATA:  Shortness of breath, chest pain for 1 week. EXAM: PORTABLE CHEST 1 VIEW COMPARISON:  12/09/2015 FINDINGS: Cardiomegaly. There is hyperinflation of the lungs compatible with COPD. Linear scarring or atelectasis in the lung bases. Suspect small effusions. No acute bony abnormality. IMPRESSION: Linear bibasilar atelectasis or scarring.  Suspect small effusions. Mild cardiomegaly.  COPD. Electronically Signed   By: Rolm Baptise M.D.   On: 12/16/2015 14:02   Dg Abd  Portable 1v  Result Date: 12/17/2015 CLINICAL DATA:  Left-sided abdominal pain and nausea for the past 2 days. EXAM: PORTABLE ABDOMEN - 1 VIEW COMPARISON:  Chest CT from 1 day prior, same day chest radiograph l. FINDINGS: The bowel gas pattern is nonspecific without free air or bowel obstruction noted. There are streaky parenchymal airspace opacities at the lung bases consistent with bibasilar atelectasis. No effusion is identified. There is no free air. There is mild dextroconvex scoliosis of the upper lumbar spine at L2. There is lumbar spondylosis and multilevel lumbar facet arthropathy. No acute osseous abnormality. IMPRESSION EDIT: IMPRESSION EDIT No bowel obstruction or free air. Electronically Signed   By: Ashley Royalty M.D.   On: 12/17/2015 20:01       Subjective: Patient is feeling well, dyspnea at baseline, has been out of bed, no nausea or vomiting, no chest pain.   Discharge Exam: Vitals:   12/24/15 0348 12/24/15 0737  BP: 140/89 (!) 141/84  Pulse: 81 73  Resp: 15 (!) 22  Temp: 98.1 F (36.7 C) 97.7 F (36.5 C)   Vitals:   12/23/15 1905 12/24/15 0010 12/24/15 0348 12/24/15 0737  BP: 129/80 112/77 140/89 (!) 141/84  Pulse: (!) 103 78 81 73  Resp: 19 13 15  (!) 22  Temp: 98.1 F (36.7 C) 98.1 F (36.7 C) 98.1 F (36.7 C) 97.7 F (36.5 C)  TempSrc: Oral Oral Oral Oral  SpO2: 94% 96%  96%  Weight:      Height:        General: Pt is alert, awake, not in acute distress Cardiovascular: RRR, S1/S2 +, no rubs, no gallops Respiratory: CTA bilaterally, no wheezing, no rhonchi, mild rales at bases with decrease air movement.  Abdominal: Soft, NT, ND, bowel sounds + Extremities: no edema, no cyanosis    The results of significant diagnostics from this hospitalization (including imaging, microbiology, ancillary and laboratory) are listed below for reference.     Microbiology: Recent Results (from the past 240 hour(s))  MRSA PCR Screening     Status: None   Collection  Time: 12/17/15 11:10 PM  Result Value Ref Range Status   MRSA by PCR NEGATIVE NEGATIVE Final    Comment:        The GeneXpert MRSA Assay (FDA approved for NASAL specimens only), is one component of a comprehensive MRSA colonization surveillance program. It is not intended to diagnose MRSA infection nor to guide or monitor treatment for MRSA infections.   Culture, Urine     Status: Abnormal   Collection Time: 12/21/15 12:29 PM  Result Value Ref Range Status   Specimen Description URINE, CLEAN CATCH  Final   Special Requests NONE  Final   Culture >=100,000 COLONIES/mL ESCHERICHIA COLI (A)  Final   Report Status 12/23/2015 FINAL  Final   Organism ID, Bacteria ESCHERICHIA COLI (A)  Final      Susceptibility   Escherichia coli - MIC*    AMPICILLIN 4 SENSITIVE  Sensitive     CEFAZOLIN <=4 SENSITIVE Sensitive     CEFTRIAXONE <=1 SENSITIVE Sensitive     CIPROFLOXACIN <=0.25 SENSITIVE Sensitive     GENTAMICIN <=1 SENSITIVE Sensitive     IMIPENEM <=0.25 SENSITIVE Sensitive     NITROFURANTOIN <=16 SENSITIVE Sensitive     TRIMETH/SULFA <=20 SENSITIVE Sensitive     AMPICILLIN/SULBACTAM <=2 SENSITIVE Sensitive     PIP/TAZO <=4 SENSITIVE Sensitive     Extended ESBL NEGATIVE Sensitive     * >=100,000 COLONIES/mL ESCHERICHIA COLI     Labs: BNP (last 3 results)  Recent Labs  12/16/15 1350  BNP [id number]   Basic Metabolic Panel:  Recent Labs Lab 12/19/15 0214  12/20/15 0539  12/21/15 0231  12/21/15 1418 12/21/15 1747 12/22/15 0231 12/22/15 0741 12/23/15 0211  NA 119*  < > 117*  < > 117*  < > 118* 119* 120* 122* 122*  K 3.1*  < > 4.5  < > 3.5  < > 3.7 3.3* 3.2* 3.1* 3.1*  CL 78*  < > 78*  < > 75*  < > 75* 76* 75* 75* 78*  CO2 31  < > 32  < > 31  < > 30 31 35* 35* 31  GLUCOSE 159*  < > 129*  < > 147*  < > 145* 161* 147* 129* 150*  BUN 18  < > 17  < > 20  < > 22* 22* 24* 23* 23*  CREATININE 0.87  < > 0.89  < > 0.87  < > 0.93 0.94 1.03* 0.91 0.97  CALCIUM 8.7*  < > 8.8*  < > 8.7*   < > 8.6* 8.8* 8.7* 8.9 8.3*  MG 1.7  --  1.8  --  1.7  --   --   --  1.8  --  1.5*  PHOS 1.7*  --  1.8*  --  2.1*  --   --   --  2.2*  --  1.7*  < > = values in this interval not displayed. Liver Function Tests:  Recent Labs Lab 12/17/15 1519 12/18/15 0348 12/19/15 0214 12/20/15 0539 12/21/15 0231 12/22/15 0231 12/23/15 0211  AST 67* 66*  --   --   --   --   --   ALT 34 35  --   --   --   --   --   ALKPHOS 70 74  --   --   --   --   --   BILITOT 0.8 0.8  --   --   --   --   --   PROT 6.4* 6.4*  --   --   --   --   --   ALBUMIN 3.1* 3.1*  3.0* 3.4* 3.2* 3.2* 3.3* 3.2*   No results for input(s): LIPASE, AMYLASE in the last 168 hours. No results for input(s): AMMONIA in the last 168 hours. CBC:  Recent Labs Lab 12/19/15 0214 12/20/15 0539 12/21/15 0231 12/22/15 0231 12/23/15 0211  WBC 6.0 7.9 6.0 6.9 7.9  NEUTROABS 5.2 6.7 5.2 6.0 7.1  HGB 13.0 13.4 12.7 13.9 13.2  HCT 35.7* 36.9 35.7* 38.5 37.2  MCV 87.1 87.0 86.7 87.3 88.4  PLT 162 192 183 214 195   Cardiac Enzymes:  Recent Labs Lab 12/17/15 1519 12/17/15 2242 12/18/15 0348  TROPONINI <0.03 <0.03 <0.03   BNP: Invalid input(s): POCBNP CBG: No results for input(s): GLUCAP in the last 168 hours. D-Dimer No results for input(s): DDIMER in the  last 72 hours. Hgb A1c No results for input(s): HGBA1C in the last 72 hours. Lipid Profile No results for input(s): CHOL, HDL, LDLCALC, TRIG, CHOLHDL, LDLDIRECT in the last 72 hours. Thyroid function studies No results for input(s): TSH, T4TOTAL, T3FREE, THYROIDAB in the last 72 hours.  Invalid input(s): FREET3 Anemia work up No results for input(s): VITAMINB12, FOLATE, FERRITIN, TIBC, IRON, RETICCTPCT in the last 72 hours. Urinalysis    Component Value Date/Time   COLORURINE YELLOW 12/21/2015 [place] 12/21/2015 1229   LABSPEC 1.010 12/21/2015 1229   PHURINE 5.5 12/21/2015 1229   GLUCOSEU NEGATIVE 12/21/2015 1229   GLUCOSEU NEGATIVE  09/13/2013 1729   HGBUR NEGATIVE 12/21/2015 1229   HGBUR small 04/15/2010 [place] 12/21/2015 [place] 12/21/2015 1229   PROTEINUR NEGATIVE 12/21/2015 1229   UROBILINOGEN 1.0 09/13/2013 1729   NITRITE NEGATIVE 12/21/2015 1229   LEUKOCYTESUR TRACE (A) 12/21/2015 1229   Sepsis Labs Invalid input(s): PROCALCITONIN,  WBC,  LACTICIDVEN Microbiology Recent Results (from the past 240 hour(s))  MRSA PCR Screening     Status: None   Collection Time: 12/17/15 11:10 PM  Result Value Ref Range Status   MRSA by PCR NEGATIVE NEGATIVE Final    Comment:        The GeneXpert MRSA Assay (FDA approved for NASAL specimens only), is one component of a comprehensive MRSA colonization surveillance program. It is not intended to diagnose MRSA infection nor to guide or monitor treatment for MRSA infections.   Culture, Urine     Status: Abnormal   Collection Time: 12/21/15 12:29 PM  Result Value Ref Range Status   Specimen Description URINE, CLEAN CATCH  Final   Special Requests NONE  Final   Culture >=100,000 COLONIES/mL ESCHERICHIA COLI (A)  Final   Report Status 12/23/2015 FINAL  Final   Organism ID, Bacteria ESCHERICHIA COLI (A)  Final      Susceptibility   Escherichia coli - MIC*    AMPICILLIN 4 SENSITIVE Sensitive     CEFAZOLIN <=4 SENSITIVE Sensitive     CEFTRIAXONE <=1 SENSITIVE Sensitive     CIPROFLOXACIN <=0.25 SENSITIVE Sensitive     GENTAMICIN <=1 SENSITIVE Sensitive     IMIPENEM <=0.25 SENSITIVE Sensitive     NITROFURANTOIN <=16 SENSITIVE Sensitive     TRIMETH/SULFA <=20 SENSITIVE Sensitive     AMPICILLIN/SULBACTAM <=2 SENSITIVE Sensitive     PIP/TAZO <=4 SENSITIVE Sensitive     Extended ESBL NEGATIVE Sensitive     * >=100,000 COLONIES/mL ESCHERICHIA COLI     Time coordinating discharge: 45 minutes  SIGNED:   Tawni Millers, MD  [facility] 12/24/2015, 8:45 AM Pager   If 7PM-7AM, please contact  night-coverage [email or web address] Password [email or web address]

## 2015-12-29 ENCOUNTER — Telehealth: Payer: Self-pay | Admitting: Internal Medicine

## 2015-12-29 DIAGNOSIS — J9611 Chronic respiratory failure with hypoxia: Secondary | ICD-10-CM

## 2016-01-07 ENCOUNTER — Ambulatory Visit: Payer: Medicare Other | Admitting: Pulmonary Disease

## 2016-01-09 ENCOUNTER — Ambulatory Visit: Payer: Medicare Other | Admitting: Internal Medicine

## 2016-01-23 NOTE — Telephone Encounter (Signed)
Spoke with patient's daughter; needs order to [facility] to pick home O2. Pt passed away today. Order has been placed and will send to [facility] as Juluis Rainier.

## 2016-01-23 DEATH — deceased

## 2016-03-17 ENCOUNTER — Ambulatory Visit: Payer: Medicare Other | Admitting: Internal Medicine

## 2017-07-06 IMAGING — DX DG CHEST 2V
2 series · 2 of 2 positions shown · non-contrast
Comparison: Chest x-ray of 11/21/2015

CLINICAL DATA: Chronic cough, congestion, shortness of breath,
former smoking history

EXAM:
CHEST  2 VIEW

[chest pa]
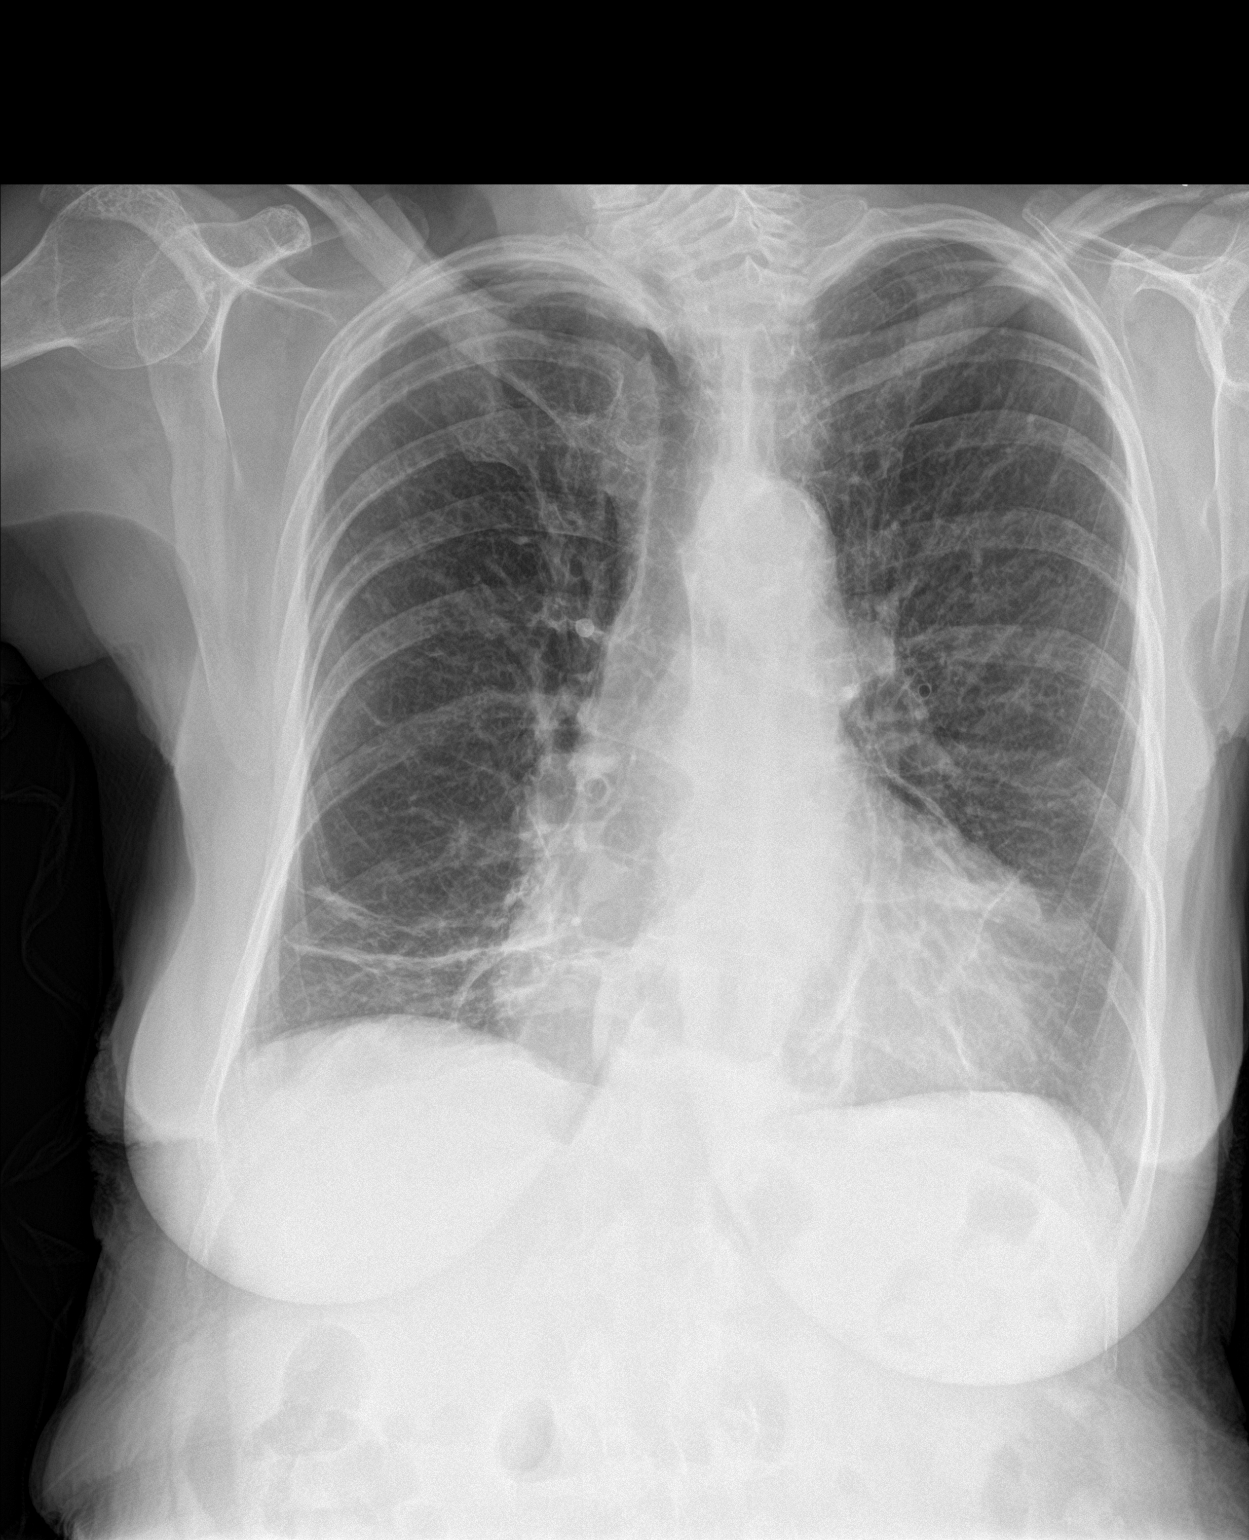

[chest lat]
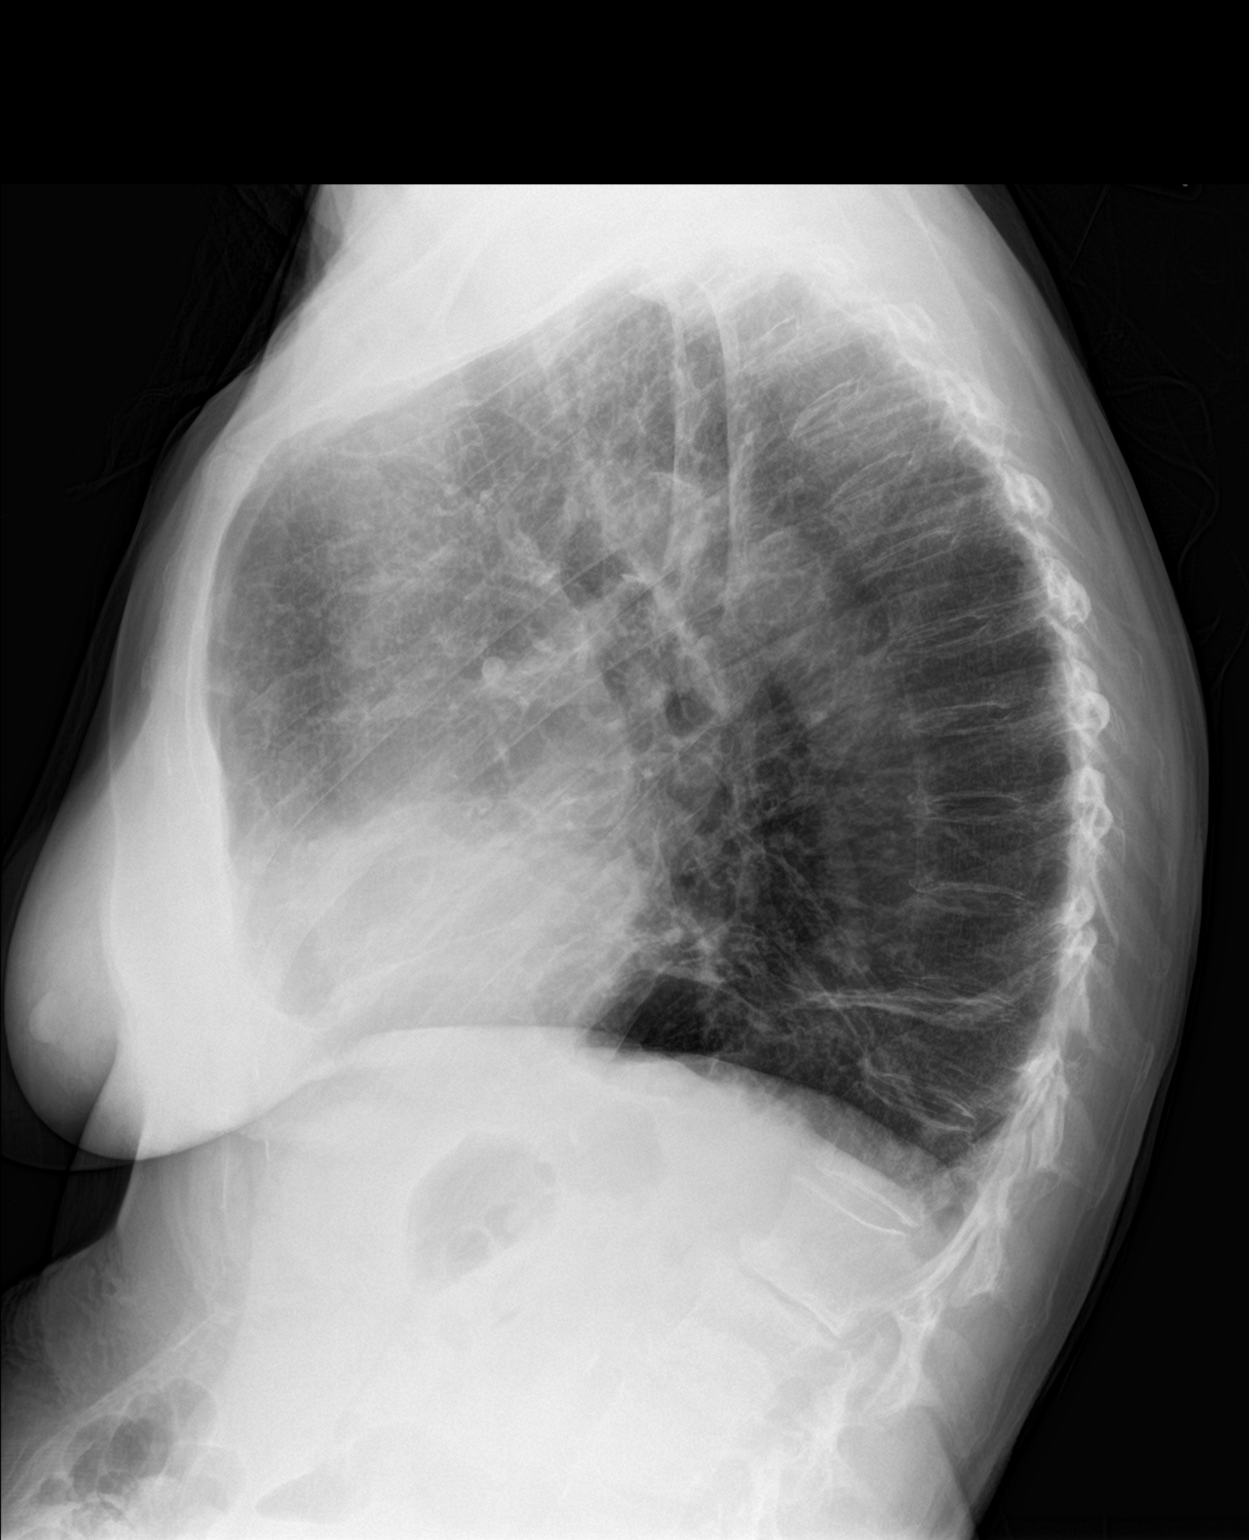

[2 of 2 positions shown; findings below may reference images not displayed]

FINDINGS: Linear scarring or atelectasis at the lung bases is stable. No
parenchymal infiltrate or pleural effusion is seen. The lungs remain
hyperaerated suggesting an element of emphysema with increased AP
diameter as well. No mediastinal or hilar adenopathy is seen. The
heart is mildly enlarged and stable. No acute bony abnormality is
noted with the bones appearing somewhat osteopenic.
IMPRESSION: Stable bibasilar linear scarring or atelectasis. No definite active
process. Hyperaeration suggesting emphysema.

## 2017-07-14 IMAGING — DX DG CHEST 1V PORT
1 series · 1 of 1 positions shown · non-contrast
Comparison: CT chest and chest radiograph 12/16/2015.

CLINICAL DATA: Shortness of breath.

EXAM:
PORTABLE CHEST 1 VIEW

[chest ap]
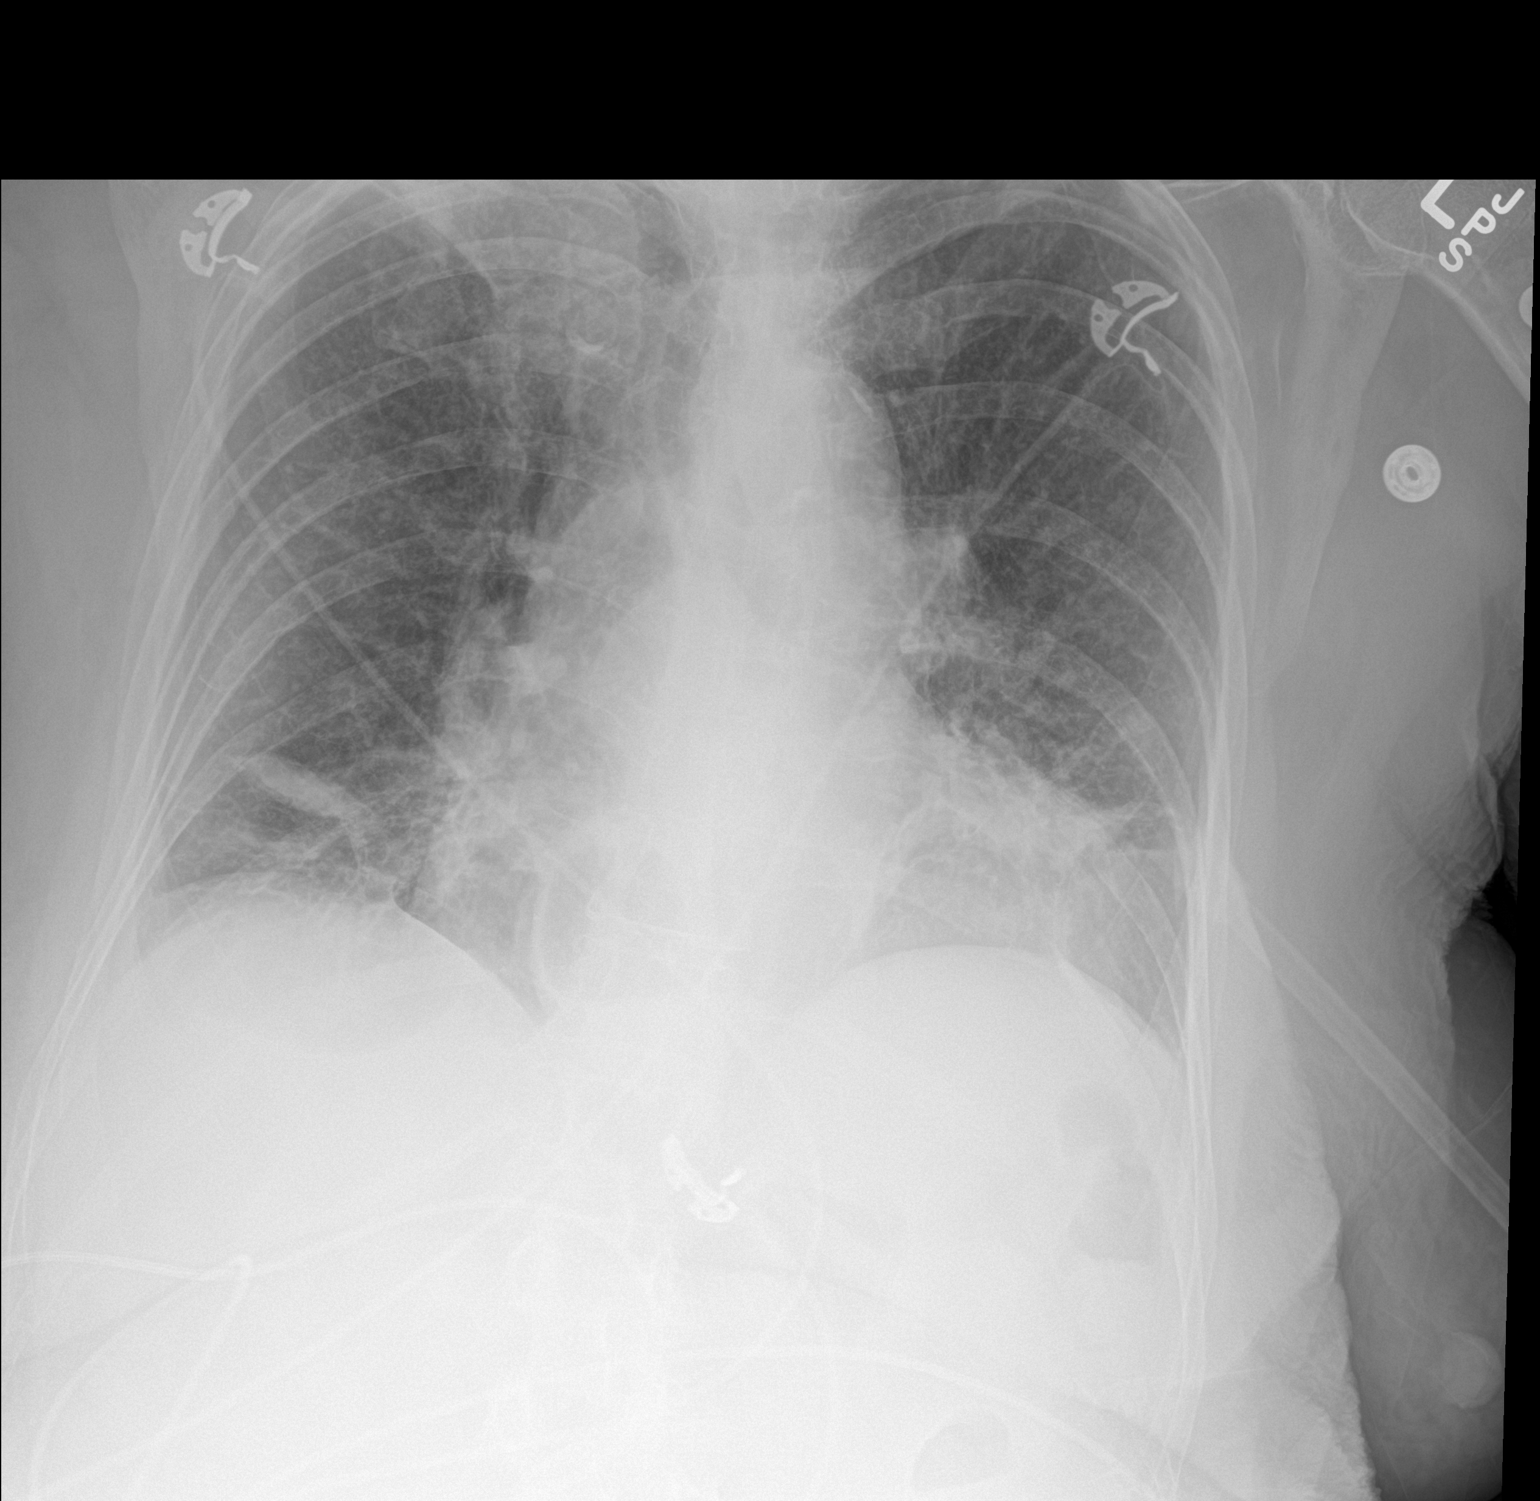

[1 of 1 positions shown; findings below may reference images not displayed]

FINDINGS: Patient is rotated. Heart size is accentuated by AP semi upright
technique and low lung volumes. Thoracic aorta is calcified.
Subcarinal mass, as on yesterday's CT exam. Bibasilar subsegmental
atelectasis. Lungs are otherwise clear. No pleural fluid. Moderate
hiatal hernia.
IMPRESSION: 1. Low lung volumes with bibasilar atelectasis.
2. Subcarinal mass, better seen on yesterday's CT chest.
3.  Aortic atherosclerosis ([id number]-170.0).

## 2017-07-15 IMAGING — DX DG CHEST 1V PORT
1 series · 1 of 1 positions shown · non-contrast
Comparison: 12/17/2015

CLINICAL DATA: Shortness of breath.

EXAM:
PORTABLE CHEST 1 VIEW

[chest ap]
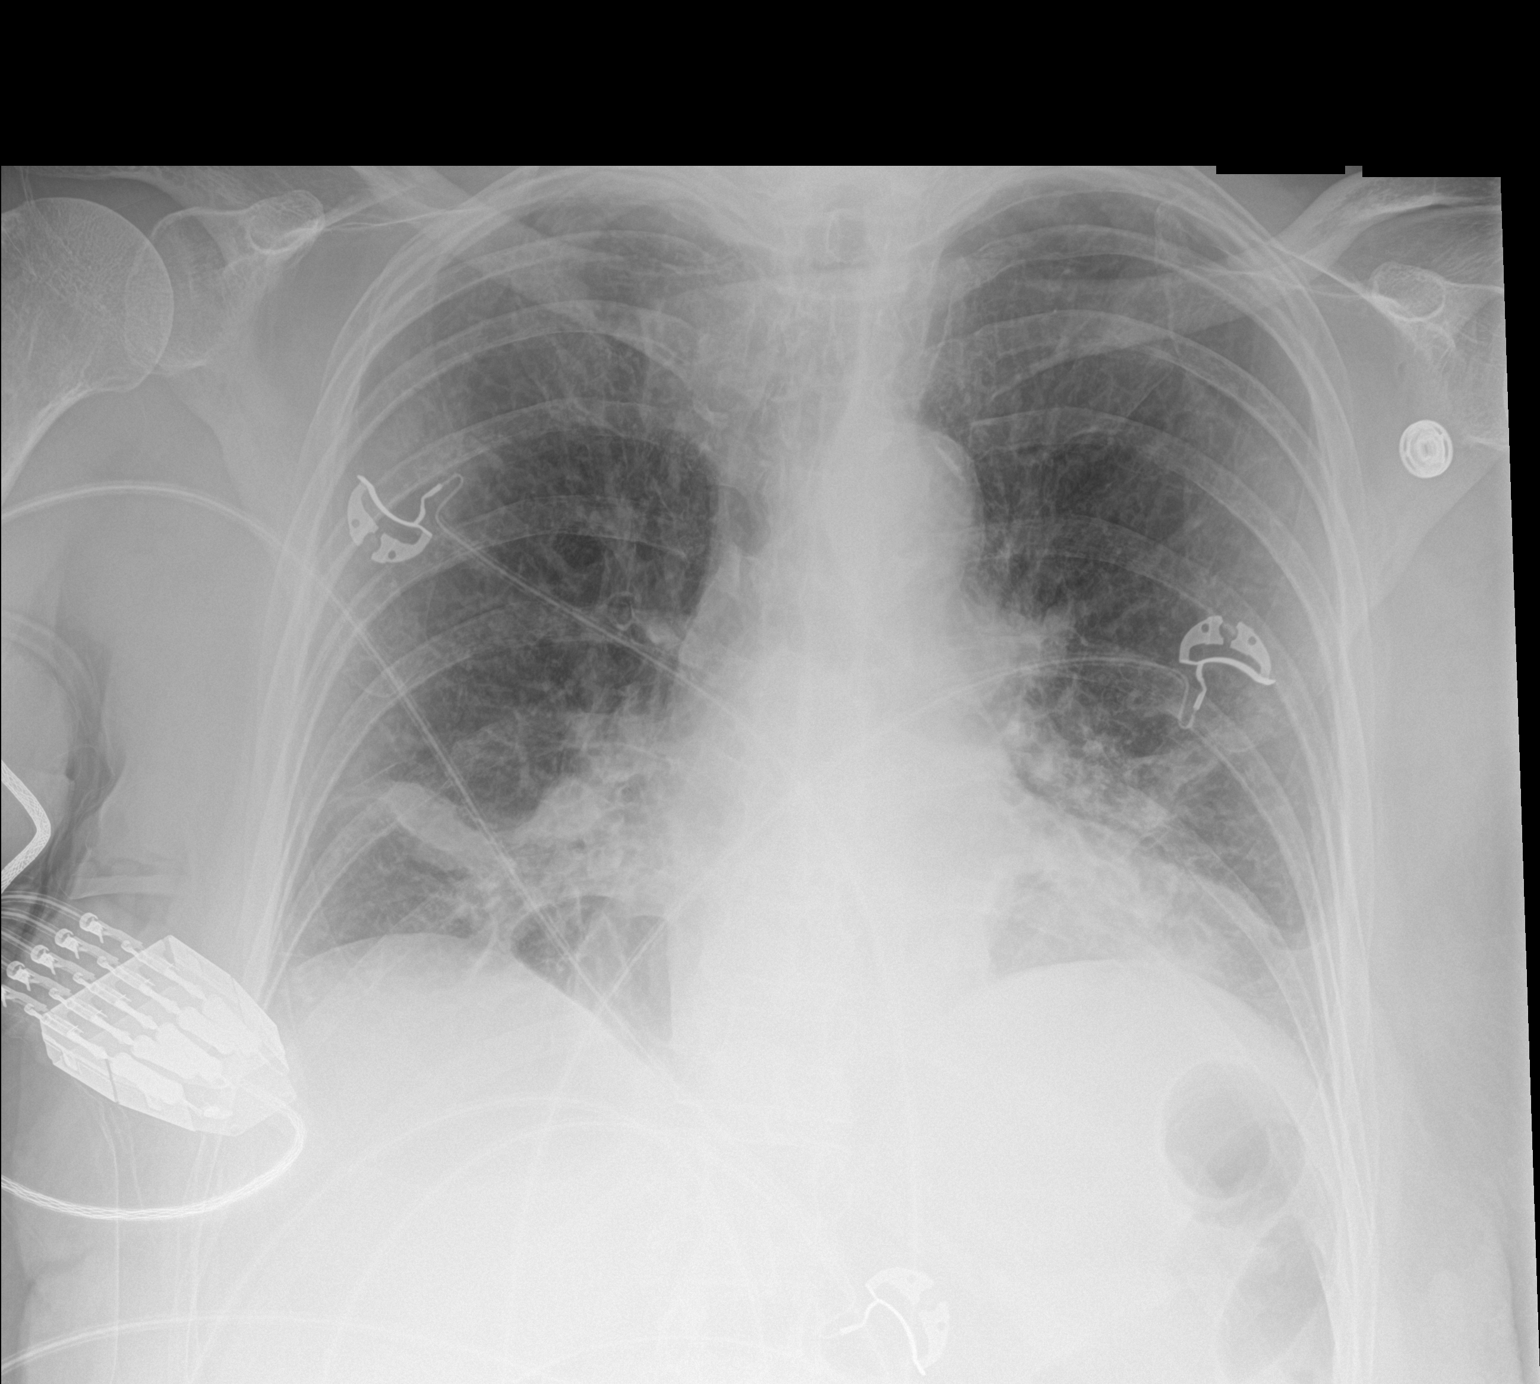

[1 of 1 positions shown; findings below may reference images not displayed]

FINDINGS: The cardiac silhouette is borderline enlarged. Thoracic aortic
atherosclerosis is again noted. Subcarinal lymphadenopathy/mass is
better seen on recent CT. Less gaseous distension of hiatal hernia.
Lung volumes remain diminished with unchanged bibasilar opacities.
No sizable pleural effusion or pneumothorax is identified.
IMPRESSION: 1. Low lung volumes with unchanged bibasilar atelectasis.
2. Subcarinal lymphadenopathy/mass as demonstrated on recent CT.
3. Aortic atherosclerosis.

## 2017-07-17 IMAGING — CR DG CHEST 1V PORT
1 series · 1 of 1 positions shown · non-contrast
Comparison: 12/18/2015

CLINICAL DATA: Respiratory failure

EXAM:
PORTABLE CHEST 1 VIEW

[AP]
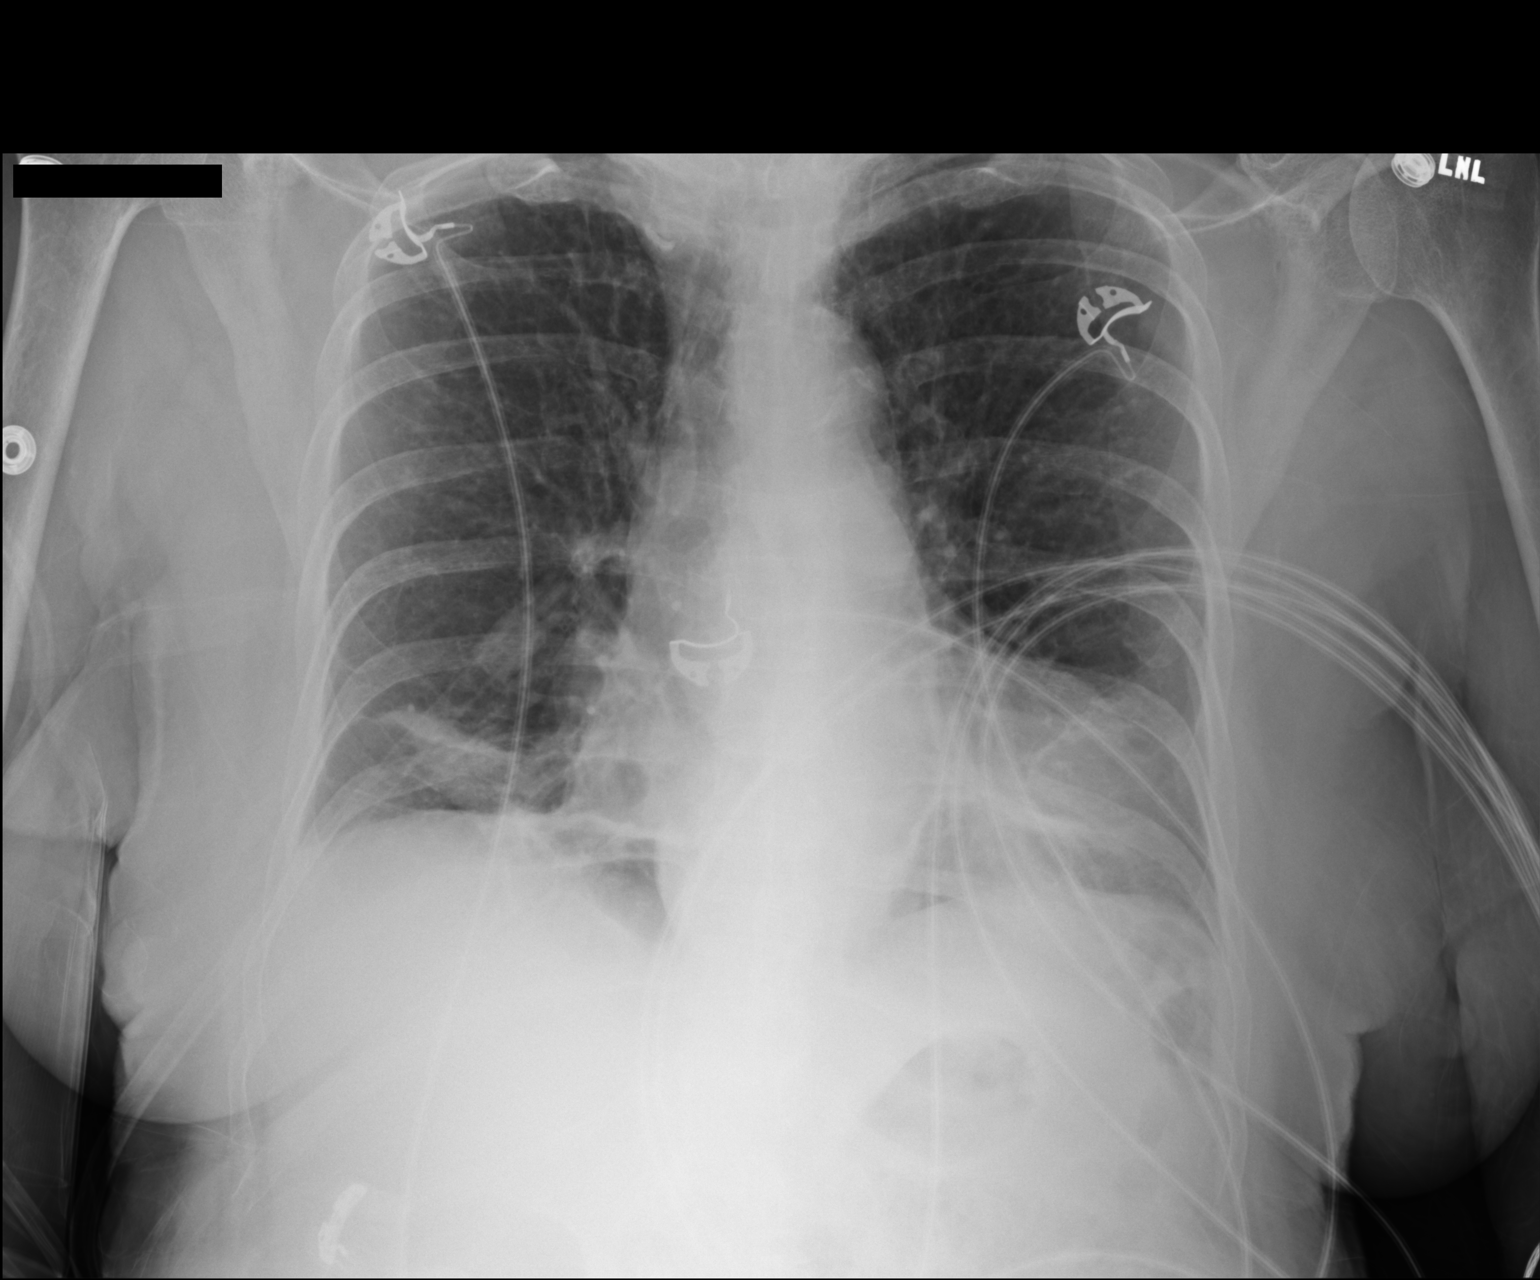

[1 of 1 positions shown; findings below may reference images not displayed]

FINDINGS: Interval improvement in bibasilar atelectasis. Negative for heart
failure or effusion. No pneumothorax.
IMPRESSION: Improvement in bibasilar atelectasis
